# Patient Record
Sex: Female | Born: 1970 | Race: White | Hispanic: No | Marital: Married | Smoking: Never smoker
Health system: Southern US, Community
[De-identification: ages and names within clinical notes are randomized; demographics above are authoritative.]

## PROBLEM LIST (undated history)

## (undated) DIAGNOSIS — R7303 Prediabetes: Secondary | ICD-10-CM

## (undated) DIAGNOSIS — Z9889 Other specified postprocedural states: Secondary | ICD-10-CM

## (undated) DIAGNOSIS — N809 Endometriosis, unspecified: Secondary | ICD-10-CM

## (undated) DIAGNOSIS — Z87820 Personal history of traumatic brain injury: Secondary | ICD-10-CM

## (undated) DIAGNOSIS — M1990 Unspecified osteoarthritis, unspecified site: Secondary | ICD-10-CM

## (undated) DIAGNOSIS — Z8719 Personal history of other diseases of the digestive system: Secondary | ICD-10-CM

## (undated) DIAGNOSIS — E282 Polycystic ovarian syndrome: Secondary | ICD-10-CM

## (undated) DIAGNOSIS — H6990 Unspecified Eustachian tube disorder, unspecified ear: Secondary | ICD-10-CM

## (undated) DIAGNOSIS — Z803 Family history of malignant neoplasm of breast: Secondary | ICD-10-CM

## (undated) DIAGNOSIS — F909 Attention-deficit hyperactivity disorder, unspecified type: Secondary | ICD-10-CM

## (undated) DIAGNOSIS — J302 Other seasonal allergic rhinitis: Secondary | ICD-10-CM

## (undated) DIAGNOSIS — E039 Hypothyroidism, unspecified: Secondary | ICD-10-CM

## (undated) DIAGNOSIS — R519 Headache, unspecified: Secondary | ICD-10-CM

## (undated) DIAGNOSIS — N920 Excessive and frequent menstruation with regular cycle: Secondary | ICD-10-CM

## (undated) DIAGNOSIS — Z87442 Personal history of urinary calculi: Secondary | ICD-10-CM

## (undated) DIAGNOSIS — G4700 Insomnia, unspecified: Secondary | ICD-10-CM

## (undated) DIAGNOSIS — R112 Nausea with vomiting, unspecified: Secondary | ICD-10-CM

## (undated) DIAGNOSIS — N201 Calculus of ureter: Secondary | ICD-10-CM

## (undated) DIAGNOSIS — N946 Dysmenorrhea, unspecified: Secondary | ICD-10-CM

## (undated) DIAGNOSIS — E785 Hyperlipidemia, unspecified: Secondary | ICD-10-CM

## (undated) DIAGNOSIS — K5909 Other constipation: Secondary | ICD-10-CM

## (undated) DIAGNOSIS — K9041 Non-celiac gluten sensitivity: Secondary | ICD-10-CM

## (undated) DIAGNOSIS — H90A31 Mixed conductive and sensorineural hearing loss, unilateral, right ear with restricted hearing on the contralateral side: Secondary | ICD-10-CM

## (undated) DIAGNOSIS — K219 Gastro-esophageal reflux disease without esophagitis: Secondary | ICD-10-CM

## (undated) DIAGNOSIS — G4720 Circadian rhythm sleep disorder, unspecified type: Secondary | ICD-10-CM

## (undated) HISTORY — PX: TONSILLECTOMY: SUR1361

## (undated) HISTORY — PX: CHOLECYSTECTOMY: SHX55

## (undated) HISTORY — PX: BREAST EXCISIONAL BIOPSY: SUR124

## (undated) HISTORY — PX: DIAGNOSTIC LAPAROSCOPY: SUR761

## (undated) HISTORY — DX: Hyperlipidemia, unspecified: E78.5

## (undated) HISTORY — PX: BACK SURGERY: SHX140

## (undated) HISTORY — PX: LAPAROSCOPY: SHX197

---

## 1994-12-04 DIAGNOSIS — Z87820 Personal history of traumatic brain injury: Secondary | ICD-10-CM

## 1994-12-04 HISTORY — DX: Personal history of traumatic brain injury: Z87.820

## 1996-12-04 HISTORY — PX: MANDIBLE FRACTURE SURGERY: SHX706

## 1998-12-04 HISTORY — PX: ANTERIOR CERVICAL DECOMP/DISCECTOMY FUSION: SHX1161

## 2002-12-04 HISTORY — PX: CHOLECYSTECTOMY: SHX55

## 2005-12-04 HISTORY — PX: TYMPANOPLASTY W/ MASTOIDECTOMY: SUR1400

## 2005-12-04 HISTORY — PX: OTHER SURGICAL HISTORY: SHX169

## 2006-12-04 HISTORY — PX: BREAST EXCISIONAL BIOPSY: SUR124

## 2010-12-04 DIAGNOSIS — Z8669 Personal history of other diseases of the nervous system and sense organs: Secondary | ICD-10-CM

## 2010-12-04 HISTORY — DX: Personal history of other diseases of the nervous system and sense organs: Z86.69

## 2011-04-26 ENCOUNTER — Other Ambulatory Visit: Payer: Self-pay | Admitting: Gynecology

## 2011-04-26 DIAGNOSIS — R2230 Localized swelling, mass and lump, unspecified upper limb: Secondary | ICD-10-CM

## 2011-05-05 ENCOUNTER — Ambulatory Visit
Admission: RE | Admit: 2011-05-05 | Discharge: 2011-05-05 | Disposition: A | Discharge status: Home / Self Care | Payer: 59 | Source: Ambulatory Visit | Attending: Gynecology | Admitting: Gynecology

## 2011-05-05 ENCOUNTER — Other Ambulatory Visit: Payer: Self-pay | Admitting: Gynecology

## 2011-05-05 DIAGNOSIS — R2230 Localized swelling, mass and lump, unspecified upper limb: Secondary | ICD-10-CM

## 2011-06-24 ENCOUNTER — Observation Stay (HOSPITAL_COMMUNITY)
Admission: EM | Admit: 2011-06-24 | Discharge: 2011-06-24 | Disposition: A | Discharge status: Home / Self Care | Payer: 59 | Attending: Emergency Medicine | Admitting: Emergency Medicine

## 2011-06-24 ENCOUNTER — Emergency Department (HOSPITAL_COMMUNITY): Payer: 59

## 2011-06-24 DIAGNOSIS — Z79899 Other long term (current) drug therapy: Secondary | ICD-10-CM | POA: Insufficient documentation

## 2011-06-24 DIAGNOSIS — N12 Tubulo-interstitial nephritis, not specified as acute or chronic: Principal | ICD-10-CM | POA: Insufficient documentation

## 2011-06-24 DIAGNOSIS — R112 Nausea with vomiting, unspecified: Secondary | ICD-10-CM | POA: Insufficient documentation

## 2011-06-24 DIAGNOSIS — E282 Polycystic ovarian syndrome: Secondary | ICD-10-CM | POA: Insufficient documentation

## 2011-06-24 LAB — URINE MICROSCOPIC-ADD ON

## 2011-06-24 LAB — URINALYSIS, ROUTINE W REFLEX MICROSCOPIC
Nitrite: NEGATIVE
Specific Gravity, Urine: 1.02 (ref 1.005–1.030)
Urobilinogen, UA: 0.2 mg/dL (ref 0.0–1.0)

## 2011-06-26 LAB — POCT I-STAT, CHEM 8
Calcium, Ion: 1.15 mmol/L (ref 1.12–1.32)
Chloride: 109 mEq/L (ref 96–112)
HCT: 44 % (ref 36.0–46.0)
Sodium: 140 mEq/L (ref 135–145)
TCO2: 22 mmol/L (ref 0–100)

## 2011-06-26 LAB — POCT PREGNANCY, URINE: Preg Test, Ur: NEGATIVE

## 2011-06-26 LAB — LIPASE, BLOOD: Lipase: 251 U/L — ABNORMAL HIGH (ref 11–59)

## 2011-06-26 LAB — CBC
HCT: 41.7 % (ref 36.0–46.0)
Platelets: 305 10*3/uL (ref 150–400)
RDW: 13.1 % (ref 11.5–15.5)
WBC: 12.2 10*3/uL — ABNORMAL HIGH (ref 4.0–10.5)

## 2011-06-26 LAB — HEPATIC FUNCTION PANEL
Bilirubin, Direct: <0.1 mg/dL (ref 0.0–0.3)
Total Bilirubin: 0.2 mg/dL — ABNORMAL LOW (ref 0.3–1.2)

## 2011-06-28 ENCOUNTER — Other Ambulatory Visit: Payer: Self-pay | Admitting: Gastroenterology

## 2011-06-28 DIAGNOSIS — R1013 Epigastric pain: Secondary | ICD-10-CM

## 2011-06-29 ENCOUNTER — Ambulatory Visit
Admission: RE | Admit: 2011-06-29 | Discharge: 2011-06-29 | Disposition: A | Discharge status: Home / Self Care | Payer: 59 | Source: Ambulatory Visit | Attending: Gastroenterology | Admitting: Gastroenterology

## 2011-06-29 DIAGNOSIS — R1013 Epigastric pain: Secondary | ICD-10-CM

## 2011-06-29 MED ORDER — GADOBENATE DIMEGLUMINE 529 MG/ML IV SOLN
15.0000 mL | Freq: Once | INTRAVENOUS | Status: AC | PRN
  Administered 2011-06-29: 15 mL via INTRAVENOUS

## 2011-10-05 HISTORY — PX: ENDOSCOPIC TURBINATE REDUCTION: SHX6489

## 2011-10-06 ENCOUNTER — Other Ambulatory Visit: Payer: Self-pay | Admitting: Family Medicine

## 2011-10-06 DIAGNOSIS — M542 Cervicalgia: Secondary | ICD-10-CM

## 2011-10-10 ENCOUNTER — Other Ambulatory Visit: Payer: 59

## 2011-10-10 ENCOUNTER — Ambulatory Visit
Admission: RE | Admit: 2011-10-10 | Discharge: 2011-10-10 | Disposition: A | Discharge status: Home / Self Care | Payer: 59 | Source: Ambulatory Visit | Attending: Family Medicine | Admitting: Family Medicine

## 2011-10-10 DIAGNOSIS — M542 Cervicalgia: Secondary | ICD-10-CM

## 2011-11-21 ENCOUNTER — Other Ambulatory Visit: Payer: Self-pay | Admitting: Neurology

## 2011-11-21 DIAGNOSIS — H902 Conductive hearing loss, unspecified: Secondary | ICD-10-CM

## 2011-11-21 DIAGNOSIS — F0781 Postconcussional syndrome: Secondary | ICD-10-CM

## 2011-11-21 DIAGNOSIS — R209 Unspecified disturbances of skin sensation: Secondary | ICD-10-CM

## 2011-11-30 ENCOUNTER — Ambulatory Visit
Admission: RE | Admit: 2011-11-30 | Discharge: 2011-11-30 | Disposition: A | Discharge status: Home / Self Care | Payer: 59 | Source: Ambulatory Visit | Attending: Neurology | Admitting: Neurology

## 2011-11-30 DIAGNOSIS — R209 Unspecified disturbances of skin sensation: Secondary | ICD-10-CM

## 2011-11-30 DIAGNOSIS — H902 Conductive hearing loss, unspecified: Secondary | ICD-10-CM

## 2011-11-30 DIAGNOSIS — F0781 Postconcussional syndrome: Secondary | ICD-10-CM

## 2011-11-30 MED ORDER — GADOBENATE DIMEGLUMINE 529 MG/ML IV SOLN
16.0000 mL | Freq: Once | INTRAVENOUS | Status: AC | PRN
  Administered 2011-11-30: 16 mL via INTRAVENOUS

## 2012-04-25 ENCOUNTER — Other Ambulatory Visit: Payer: Self-pay | Admitting: Family Medicine

## 2012-04-25 DIAGNOSIS — R1084 Generalized abdominal pain: Secondary | ICD-10-CM

## 2012-05-02 ENCOUNTER — Ambulatory Visit
Admission: RE | Admit: 2012-05-02 | Discharge: 2012-05-02 | Disposition: A | Discharge status: Home / Self Care | Payer: 59 | Source: Ambulatory Visit | Attending: Family Medicine | Admitting: Family Medicine

## 2012-05-02 DIAGNOSIS — R1084 Generalized abdominal pain: Secondary | ICD-10-CM

## 2012-05-02 MED ORDER — IOHEXOL 300 MG/ML  SOLN
100.0000 mL | Freq: Once | INTRAMUSCULAR | Status: AC | PRN
  Administered 2012-05-02: 100 mL via INTRAVENOUS

## 2012-05-15 ENCOUNTER — Encounter (INDEPENDENT_AMBULATORY_CARE_PROVIDER_SITE_OTHER): Payer: Self-pay | Admitting: Surgery

## 2012-05-16 ENCOUNTER — Ambulatory Visit (INDEPENDENT_AMBULATORY_CARE_PROVIDER_SITE_OTHER): Payer: 59 | Admitting: Surgery

## 2012-05-16 ENCOUNTER — Encounter (INDEPENDENT_AMBULATORY_CARE_PROVIDER_SITE_OTHER): Payer: Self-pay | Admitting: Surgery

## 2012-05-16 VITALS — BP 120/82 | HR 68 | Temp 97.4°F | Resp 14 | Ht 64.0 in | Wt 174.6 lb

## 2012-05-16 DIAGNOSIS — K432 Incisional hernia without obstruction or gangrene: Secondary | ICD-10-CM | POA: Insufficient documentation

## 2012-05-16 DIAGNOSIS — R109 Unspecified abdominal pain: Secondary | ICD-10-CM | POA: Insufficient documentation

## 2012-05-16 NOTE — Patient Instructions (Signed)
[facility]  UMBILICAL OR INGUINAL HERNIA REPAIR POST OP INSTRUCTIONS  Always review your discharge instruction sheet given to you by the facility where your surgery was performed.  1. A  prescription for pain medication may be given to you upon discharge.  Take your pain medication as prescribed, if needed.  If narcotic pain medicine is not needed, then you may take acetaminophen (Tylenol) or ibuprofen (Advil) as needed. 2. Take your usually prescribed medications unless otherwise directed. 3. If you need a refill on your pain medication, please contact your pharmacy.  They will contact our office to request authorization. Prescriptions will not be filled after 5 pm daily or on weekends. 4. You should follow a light diet the first 24 hours after arrival home, such as soup and crackers, etc.  Be sure to include lots of fluids daily.  Resume your normal diet the day after surgery. 5. Most patients will experience some swelling and bruising around the umbilicus or in the groin and scrotum.  Ice packs and reclining will help.  Swelling and bruising can take several days to resolve.  6. It is common to experience some constipation if taking pain medication after surgery.  Increasing fluid intake and taking a stool softener (such as Colace) will usually help or prevent this problem from occurring.  A mild laxative (Milk of Magnesia or Miralax) should be taken according to package directions if there are no bowel movements after 48 hours. 7. Unless discharge instructions indicate otherwise, you may remove your bandages 24-48 hours after surgery, and you may shower at that time.  You may have steri-strips (small skin tapes) in place directly over the incision.  These strips should be left on the skin for 7-10 days.  If your surgeon used skin glue on the incision, you may shower in 24 hours.  The glue will flake off over the next 2-3 weeks.  Any sutures or staples will be removed at the office  during your follow-up visit. 8. ACTIVITIES:  You may resume regular (light) daily activities beginning the next day--such as daily self-care, walking, climbing stairs--gradually increasing activities as tolerated.  You may have sexual intercourse when it is comfortable.  Refrain from any heavy lifting or straining until approved by your doctor.  You may drive when you are no longer taking prescription pain medication, you can comfortably wear a seatbelt, and you can safely maneuver your car and apply brakes. 9. You should see your doctor in the office for a follow-up appointment approximately 2-3 weeks after your surgery.  Make sure that you call for this appointment within a day or two after you arrive home to insure a convenient appointment time.  WHEN TO CALL YOUR DOCTOR: 1. Fever over 101.0 2. Inability to urinate 3. Nausea and/or vomiting 4. Extreme swelling or bruising 5. Continued bleeding from incision. 6. Increased pain, redness, or drainage from the incision The clinic staff is available to answer your questions during regular business hours.  Please don't hesitate to call and ask to speak to one of the nurses for clinical concerns.  If you have a medical emergency, go to the nearest emergency room or call 911.  A surgeon from [facility] is always on call at the hospital.   [address], [address], [place], [place]  [address]  [address] [address], , [place]   [address] [phone] ? [phone] ? FAX [phone] Website: [email or web address]   

## 2012-05-16 NOTE — Progress Notes (Signed)
Chief Complaint  Patient presents with  . Abdominal Pain    new referral from Dr. Teodora Medici - RLQ and periumbilical abdominal pain    HISTORY: Patient is a 41 year old white female referred by her gynecologist for peri-umbilical and right lower quadrant abdominal pain. Patient has had a significant past surgical history with cholecystectomy performed in [place], [place], in 2004. She has a history of endometriosis and has undergone laparoscopy on two prior occasions, the most recent being in 2011 in [place], [place]. Patient has polycystic ovaries.  Patient presents with complaints of lower abdominal pain. This is exacerbated by physical activity and by sexual intercourse. Patient has noted a small hernia at the umbilicus. This was confirmed on CT scan of the abdomen and pelvis performed on May 02, 2012. No other significant abnormality was seen on that scan.  Patient is referred at this time for consideration for hernia repair and possible diagnostic laparoscopy concurrently for evaluation of lower abdominal pain.  Past Medical History  Diagnosis Date  . Thyroid disease     hypothyroidism  . Hyperlipidemia      Current Outpatient Prescriptions  Medication Sig Dispense Refill  . fish oil-omega-3 fatty acids 1000 MG capsule Take 2 g by mouth daily.      Marland Kitchen levothyroxine (LEVOXYL) 137 MCG tablet Take 137 mcg by mouth daily.      . Multiple Vitamins-Minerals (MULTIVITAMIN WITH MINERALS) tablet Take 1 tablet by mouth daily.      . traZODone (DESYREL) 100 MG tablet Take 100 mg by mouth at bedtime.         Allergies  Allergen Reactions  . Ciprofloxacin   . Codeine   . Latex   . Morphine And Related      Family History  Problem Relation Age of Onset  . Cancer Father     melanoma  . Heart disease Maternal Aunt   . Heart disease Maternal Uncle   . Heart disease Maternal Grandmother   . Heart disease Maternal Grandfather      History   Social History  .  Marital Status: Married    Spouse Name: N/A    Number of Children: N/A  . Years of Education: N/A   Social History Main Topics  . Smoking status: Never Smoker   . Smokeless tobacco: Never Used  . Alcohol Use: No  . Drug Use: No  . Sexually Active:    Other Topics Concern  . None   Social History Narrative  . None    REVIEW OF SYSTEMS - PERTINENT POSITIVES ONLY: Intermittent abdominal pain, exacerbated by physical activity; mild weight gain; no signs or symptoms of obstruction  EXAM: Filed Vitals:   05/16/12 1035  BP: 120/82  Pulse: 68  Temp: 97.4 F (36.3 C)  Resp: 14    HEENT: normocephalic; pupils equal and reactive; sclerae clear; dentition good; mucous membranes moist NECK:  symmetric on extension; no palpable anterior or posterior cervical lymphadenopathy; no supraclavicular masses; no tenderness CHEST: clear to auscultation bilaterally without rales, rhonchi, or wheezes CARDIAC: regular rate and rhythm without significant murmur; peripheral pulses are full ABDOMEN: Well-healed incision at the umbilicus; obvious fascial defect approximately 1 cm in diameter, hernia augments with Valsalva and cough; small rectus diastases augmented with sit up maneuver; soft without distension; bowel sounds present; no mass; no hepatosplenomegaly EXT:  non-tender without edema; no deformity NEURO: no gross focal deficits; no sign of tremor   LABORATORY RESULTS: See [facility] (CHL-Epic) for most  recent results   RADIOLOGY RESULTS: See [facility] (CHL-Epic) for most recent results   IMPRESSION: #1 incisional hernia at umbilicus, reducible, symptomatic #2 lower abdominal and pelvic pain of undetermined etiology #3 history of endometriosis  PLAN: I discussed the above findings and studies at length with the patient and her husband. I have recommended repair of her incisional hernia at the umbilicus with mesh. We have discussed the risk and benefits of using mesh. We  have discussed the risk of recurrence.  Patient is also interested in diagnostic laparoscopy at the time of repair. I think this is appropriate given her history. We will coordinate this for her in the near future.  The risks and benefits of the procedure have been discussed at length with the patient.  The patient understands the proposed procedure, potential alternative treatments, and the course of recovery to be expected.  All of the patient's questions have been answered at this time.  The patient wishes to proceed with surgery.  Velora Heckler, MD, FACS General & Endocrine Surgery [facility]   Visit Diagnoses: No diagnosis found.  Primary Care Physician: Cain Saupe, MD  GYN:  Dr. Teodora Medici

## 2012-06-13 ENCOUNTER — Telehealth (INDEPENDENT_AMBULATORY_CARE_PROVIDER_SITE_OTHER): Payer: Self-pay | Admitting: General Surgery

## 2012-06-13 DIAGNOSIS — K429 Umbilical hernia without obstruction or gangrene: Secondary | ICD-10-CM

## 2012-06-13 HISTORY — PX: OTHER SURGICAL HISTORY: SHX169

## 2012-06-13 NOTE — Telephone Encounter (Signed)
Pt's husband calling for stronger pian meds.  Discussed her allergies to narcotics, but she is only intolerant of codeine, with nausea side effects.  She will try taking the Hydrocodone 5/325 mg, # 30, 1 po Q4-6H prn pain, no refill with OK by Dr. Gerrit Friends.  Called med to [facility]:  [phone].

## 2012-06-14 ENCOUNTER — Telehealth (INDEPENDENT_AMBULATORY_CARE_PROVIDER_SITE_OTHER): Payer: Self-pay | Admitting: General Surgery

## 2012-06-14 NOTE — Telephone Encounter (Signed)
Called patient per Dr. Ezzard Standing request. Patient stated that she is taking the Ultram but it is only taking a little edge off the pain, but not resolving it. Patient stated that Demerol is the only thing that works for her, but would like to know if Dr. Gerrit Friends can issue that for her. If so, she understands her husband would have to come to our office to pick up the prescription. Patient stated that she is allergic to hydrocodone also. While I had the patient on the phone I updated her allergies and updated record with the reactions. I advised the patient that Dr. Gerrit Friends is in surgeries this morning, but we would get back to her with an answer as quickly as possible. Patient agreed.

## 2012-06-14 NOTE — Telephone Encounter (Signed)
Patient called and advised that Dr. Gerrit Friends was made aware of her request and approved her for demerol. A copy of the prescription issued sent to medical records to be scanned. Dr. Johna Sheriff signed on his behalf due to being in the hospital for surgeries.  Patient issued demerol 50 mg 20 tablets 1 - 2 every 4 hours prn, no refills.  Patient advised her husband will pick up the prescription. Advised he needs to bring identification to pick up. Patient agreed.

## 2012-06-26 ENCOUNTER — Encounter (INDEPENDENT_AMBULATORY_CARE_PROVIDER_SITE_OTHER): Payer: Self-pay | Admitting: Surgery

## 2012-06-26 ENCOUNTER — Ambulatory Visit (INDEPENDENT_AMBULATORY_CARE_PROVIDER_SITE_OTHER): Payer: 59 | Admitting: Surgery

## 2012-06-26 VITALS — BP 110/78 | HR 68 | Temp 97.4°F | Resp 18 | Ht 65.0 in | Wt 173.0 lb

## 2012-06-26 DIAGNOSIS — R109 Unspecified abdominal pain: Secondary | ICD-10-CM

## 2012-06-26 DIAGNOSIS — K432 Incisional hernia without obstruction or gangrene: Secondary | ICD-10-CM

## 2012-06-26 NOTE — Progress Notes (Signed)
General Surgery [facility]  Visit Diagnoses: 1. Incisional hernia, umbilicus   2. Abdominal pain     HISTORY: Patient returns for her first postoperative visit having undergone diagnostic laparoscopy and repair of umbilical hernia with mesh. Diagnostic laparoscopy failed to identify any intra-abdominal abnormality. There was no sign of endometriosis. There is no sign of inflammatory bowel disease. There were no significant adhesions.  EXAM: Abdomen is soft nontender without distention. Surgical wounds are healing nicely without signs of inflammation or infection. There is no sign of recurrent hernia. Dermabond remains in place on the incisions.  IMPRESSION: Status post diagnostic laparoscopy and repair of umbilical hernia with mesh  PLAN: Patient will apply topical creams to her incisions once the Dermabond is removed. She will restrict her lifting to 20 pounds. She will return to see me for a final wound check in 6 weeks.  Velora Heckler, MD, FACS General & Endocrine Surgery [facility]

## 2012-06-26 NOTE — Patient Instructions (Signed)
  COCOA BUTTER & VITAMIN E CREAM  (Palmer's or other brand)  Apply cocoa butter/vitamin E cream to your incision 2 - 3 times daily.  Massage cream into incision for one minute with each application.  Use sunscreen (50 SPF or higher) for first 6 months after surgery if area is exposed to sun.  You may substitute Mederma or other scar reducing creams as desired.   

## 2012-08-08 ENCOUNTER — Encounter (INDEPENDENT_AMBULATORY_CARE_PROVIDER_SITE_OTHER): Payer: Self-pay | Admitting: Surgery

## 2012-08-08 ENCOUNTER — Ambulatory Visit (INDEPENDENT_AMBULATORY_CARE_PROVIDER_SITE_OTHER): Payer: 59 | Admitting: Surgery

## 2012-08-08 VITALS — BP 108/72 | HR 72 | Temp 97.9°F | Resp 16 | Ht 65.0 in | Wt 172.0 lb

## 2012-08-08 DIAGNOSIS — K432 Incisional hernia without obstruction or gangrene: Secondary | ICD-10-CM

## 2012-08-08 NOTE — Progress Notes (Signed)
General Surgery [facility]  Visit Diagnoses: 1. Incisional hernia, umbilicus     HISTORY: The patient is a 42 year old white female who underwent diagnostic laparoscopy and repair of incisional hernia at the umbilicus with mesh. She returns for final postoperative visit.  EXAM: Surgical wound at the umbilicus is well-healed. Patient is examined both in the recumbent in a standing position. There is no sign of recurrent hernia. She does have a mild rectus diastases.  IMPRESSION: Status post umbilical hernia repair with mesh  PLAN: Patient is released to full activity without restriction. She will apply topical creams to her incision. She will return to see [place] in this office as needed.  Velora Heckler, MD, FACS General & Endocrine Surgery [facility]

## 2012-08-08 NOTE — Patient Instructions (Signed)
  COCOA BUTTER & VITAMIN E CREAM  (Palmer's or other brand)  Apply cocoa butter/vitamin E cream to your incision 2 - 3 times daily.  Massage cream into incision for one minute with each application.  Use sunscreen (50 SPF or higher) for first 6 months after surgery if area is exposed to sun.  You may substitute Mederma or other scar reducing creams as desired.   

## 2012-09-16 ENCOUNTER — Other Ambulatory Visit: Payer: Self-pay | Admitting: Gastroenterology

## 2012-09-16 DIAGNOSIS — Q453 Other congenital malformations of pancreas and pancreatic duct: Secondary | ICD-10-CM

## 2012-09-23 ENCOUNTER — Ambulatory Visit
Admission: RE | Admit: 2012-09-23 | Discharge: 2012-09-23 | Disposition: A | Discharge status: Home / Self Care | Payer: 59 | Source: Ambulatory Visit | Attending: Gastroenterology | Admitting: Gastroenterology

## 2012-09-23 DIAGNOSIS — Q453 Other congenital malformations of pancreas and pancreatic duct: Secondary | ICD-10-CM

## 2012-09-23 MED ORDER — GADOBENATE DIMEGLUMINE 529 MG/ML IV SOLN
15.0000 mL | Freq: Once | INTRAVENOUS | Status: AC | PRN
  Administered 2012-09-23: 15 mL via INTRAVENOUS

## 2012-11-04 ENCOUNTER — Ambulatory Visit (INDEPENDENT_AMBULATORY_CARE_PROVIDER_SITE_OTHER): Payer: 59 | Admitting: Surgery

## 2012-11-04 VITALS — BP 118/72 | HR 68 | Temp 97.4°F | Resp 18 | Ht 65.0 in | Wt 171.0 lb

## 2012-11-04 DIAGNOSIS — R2230 Localized swelling, mass and lump, unspecified upper limb: Secondary | ICD-10-CM

## 2012-11-04 DIAGNOSIS — R229 Localized swelling, mass and lump, unspecified: Secondary | ICD-10-CM

## 2012-11-04 NOTE — Progress Notes (Signed)
General Surgery [facility]  Visit Diagnoses: 1. Axillary mass, left     HISTORY: Patient is a 41 year old white female known to my surgical practice from previous diagnostic laparoscopy. She has had a long-standing problem with left axillary ectopic breast tissue. This causes discomfort. He waxes and wanes with her monthly menstrual cycle. It causes pain with physical exercise. She has had diagnostic studies including ultrasound and mammography. She presents today for consideration for surgical excision for symptomatic relief.  PERTINENT REVIEW OF SYSTEMS: Intermittent pain left axilla, tissue waxes and wanes in size with menstrual cycle.  EXAM: HEENT: normocephalic; pupils equal and reactive; sclerae clear; dentition good; mucous membranes moist NECK:  symmetric on extension; no palpable anterior or posterior cervical lymphadenopathy; no supraclavicular masses; no tenderness CHEST: clear to auscultation bilaterally without rales, rhonchi, or wheezes CARDIAC: regular rate and rhythm without significant murmur; peripheral pulses are full EXT:  non-tender without edema; no deformity; left axilla with prominent tissue in the anterior inferior axilla behind the pectoralis major muscle consistent with ectopic breast tissue; no lymphadenopathy NEURO: no gross focal deficits; no sign of tremor   IMPRESSION: Ectopic breast tissue, left axilla, symptomatic  PLAN: The patient and I discussed the options for surgical excision. The patient's mother was present for the discussion. We discussed outpatient surgery with excision of ectopic tissue from the low axilla. We discussed potential complications been areas of anesthesia on the scan and possible postoperative seroma formation. They understand and wish to proceed. We will make arrangements for outpatient surgery at the surgical center.  Velora Heckler, MD, [facility] Office: [phone]

## 2012-11-04 NOTE — Patient Instructions (Signed)
  [facility] -- DISCHARGE INSTRUCTIONS  REMINDER:   Carry a list of your medications and allergies with you at all times  Call your pharmacy at least 1 week in advance to refill prescriptions  Do not mix any prescribed pain medicine with alcohol  Do not drive any motor vehicles while taking pain medication  Take medications with food unless otherwise directed  Follow-up appointments (date to return to physician): Please call [phone] to confirm your follow up appointment with your surgeon.  Call your Surgeon if you have:  Temperature greater than 101.0  Persistent nausea and vomiting  Severe uncontrolled pain  Redness, tenderness, or signs of infection (pain, swelling, redness, odor or    green/yellow discharge around the site)  Difficulty breathing, headache or visual disturbances  Hives  Persistent dizziness or light-headedness  Any other questions or concerns you may have after discharge  In an emergency, call 911 or go to an Emergency Department at a nearby hospital.  Diet: Begin with liquids, and if they are tolerated, resume your usual diet.  Avoid spicy, greasy or heavy foods.  If you have nausea or vomiting, go back to liquids.  If you cannot keep liquids down, call your doctor.  Avoid alcohol consumption while on prescription pain medications. Good nutrition promotes healing. Increase fiber and fluids.   Reshaun Briseno M. Lean Jaeger, MD, FACS [facility] Office: [phone] 

## 2012-11-15 ENCOUNTER — Other Ambulatory Visit (INDEPENDENT_AMBULATORY_CARE_PROVIDER_SITE_OTHER): Payer: Self-pay | Admitting: Surgery

## 2012-11-15 DIAGNOSIS — Q838 Other congenital malformations of breast: Secondary | ICD-10-CM

## 2012-11-15 HISTORY — PX: OTHER SURGICAL HISTORY: SHX169

## 2012-11-18 ENCOUNTER — Telehealth (INDEPENDENT_AMBULATORY_CARE_PROVIDER_SITE_OTHER): Payer: Self-pay

## 2012-11-18 ENCOUNTER — Telehealth (INDEPENDENT_AMBULATORY_CARE_PROVIDER_SITE_OTHER): Payer: Self-pay | Admitting: General Surgery

## 2012-11-18 NOTE — Telephone Encounter (Signed)
LMOM for pt to call back. Per Dr Gerrit Friends pt to be given path result as benign when she calls back.

## 2012-11-18 NOTE — Progress Notes (Signed)
Quick Note:  Please contact patient and notify of benign pathology results.  Aymara Sassi M. Jervon Ream, MD, FACS [facility] Office: [phone]   ______ 

## 2012-11-18 NOTE — Telephone Encounter (Signed)
Patient called back and I told her path report

## 2012-11-22 ENCOUNTER — Ambulatory Visit (INDEPENDENT_AMBULATORY_CARE_PROVIDER_SITE_OTHER): Payer: 59 | Admitting: General Surgery

## 2012-11-22 ENCOUNTER — Encounter (INDEPENDENT_AMBULATORY_CARE_PROVIDER_SITE_OTHER): Payer: Self-pay | Admitting: General Surgery

## 2012-11-22 ENCOUNTER — Encounter (INDEPENDENT_AMBULATORY_CARE_PROVIDER_SITE_OTHER): Payer: 59 | Admitting: General Surgery

## 2012-11-22 VITALS — BP 118/78 | HR 72 | Temp 97.8°F | Resp 16 | Ht 64.0 in | Wt 172.6 lb

## 2012-11-22 DIAGNOSIS — IMO0002 Reserved for concepts with insufficient information to code with codable children: Secondary | ICD-10-CM | POA: Insufficient documentation

## 2012-11-22 NOTE — Patient Instructions (Signed)
Try wearing a compression shirt or ace wrap

## 2012-11-22 NOTE — Progress Notes (Signed)
Subjective:     Patient ID: Patty Ruiz, female   DOB: 11/24/1971, 41 y.o.   MRN: [id number]  HPI 41 year old Caucasian female comes in complaining of swelling underneath her left axillary incision. She recently underwent excision of left axillary ectopic breast tissue on December 13. She states that the swelling started a few days ago but last night and this morning it became very intense and uncomfortable. She denies any fevers or chills. She denies any incisional redness or drainage.  Review of Systems     Objective:   Physical Exam BP 118/78  Pulse 72  Temp 97.8 F (36.6 C) (Temporal)  Resp 16  Ht 5\' 4"  (1.626 m)  Wt 172 lb 9.6 oz (78.291 kg)  BMI 29.63 kg/m2 Alert, no apparent distress Left axilla-incision is clean, dry, and intact. No cellulitis, or induration. There is a seroma underneath and above the incision.    Assessment:     Postoperative seroma    Plan:     We discussed the nature of seroma as. We discussed several options including observation versus aspiration. We discussed the pros and cons of each. The patient elected to have the area aspirated. After obtaining informed consent, the area was prepped with ChloraPrep. Then using a 60 cc syringe  with an 18-gauge needleI was able to aspirate approximately 50 cc of straw-colored fluid. She tolerated the procedure well. A dressing was applied. She is encouraged to wear either an Ace wrap or a compression garment to help prevent recurrence. She was given a copy of her pathology report. She will followup with Dr. Gerrit Friends as previously scheduled  Patty Ruiz. Andrey Campanile, MD, FACS General, Bariatric, & Minimally Invasive Surgery [facility] [place]

## 2012-11-26 ENCOUNTER — Telehealth (INDEPENDENT_AMBULATORY_CARE_PROVIDER_SITE_OTHER): Payer: Self-pay

## 2012-11-26 NOTE — Telephone Encounter (Signed)
Patient calling into office requesting appointment for today.  Patient reports an increase in swelling underneath her left axilla.  Patient had 50 cc's aspirated on Friday 11/22/12 w/Dr. Andrey Campanile.  Per Dr. Andrey Campanile patient advised to wear her compression garment and currently wearing a sports bra.  Patient reports she's unable to apply an ace wrap around her due to discomfort.  Patient denies any fever or chills. Patient given appointment for Thursday November 28, 2012 w/Dr. Biagio Quint.  Patient advised to report to emergency room if symptoms worsen or persist.

## 2012-11-28 ENCOUNTER — Encounter (INDEPENDENT_AMBULATORY_CARE_PROVIDER_SITE_OTHER): Payer: Self-pay | Admitting: General Surgery

## 2012-11-28 ENCOUNTER — Ambulatory Visit (INDEPENDENT_AMBULATORY_CARE_PROVIDER_SITE_OTHER): Payer: 59 | Admitting: General Surgery

## 2012-11-28 VITALS — BP 122/76 | HR 69 | Temp 98.8°F | Resp 18 | Ht 64.5 in | Wt 171.8 lb

## 2012-11-28 DIAGNOSIS — IMO0002 Reserved for concepts with insufficient information to code with codable children: Secondary | ICD-10-CM

## 2012-11-28 MED ORDER — MEPERIDINE HCL 50 MG PO TABS: 25.0000 mg | ORAL_TABLET | Freq: Three times a day (TID) | ORAL | Status: AC | PRN

## 2012-11-28 NOTE — Progress Notes (Signed)
Subjective:     Patient ID: Patty Ruiz, female   DOB: December 10, 1970, 41 y.o.   MRN: [id number]  HPI This patient follows up 2 weeks status post excision of left axillary mass by Dr. Gerrit Friends.  She has since developed some swelling in her left axilla. She was seen last week by Dr. Andrey Campanile and he aspirated some clear fluid from the area consistent with a seroma. She was told to follow up today for repeat aspiration. She says that she still has some discomfort in the area of both is not any worse than before. She has no redness or fevers or chills or drainage.  Review of Systems     Objective:   Physical Exam Her incision looks good without sign of infection. She has some ballotable fullness in the left axilla consistent with a postoperative seroma of but there is no erythema and only minimal tenderness.    Assessment:     Postoperative seroma She has a postoperative seroma without any evidence of infection. I discussed with her the options for aspiration versus continued observation and I have recommended continued observation since aspiration we'll not make her seroma go away definitively and only risk infection. Her symptoms are not that severe that I think warrant aspiration today.    Plan:     Continued observation and I did give her some oral Demerol for times of increased discomfort. She will follow up with her surgeon at her regularly scheduled appointment for followup with [place] sooner if she has any signs of infection.

## 2012-11-29 ENCOUNTER — Telehealth (INDEPENDENT_AMBULATORY_CARE_PROVIDER_SITE_OTHER): Payer: Self-pay | Admitting: General Surgery

## 2012-11-29 ENCOUNTER — Other Ambulatory Visit (INDEPENDENT_AMBULATORY_CARE_PROVIDER_SITE_OTHER): Payer: Self-pay

## 2012-11-29 MED ORDER — TRAMADOL HCL 50 MG PO TABS: 50.0000 mg | ORAL_TABLET | Freq: Four times a day (QID) | ORAL | Status: AC | PRN

## 2012-11-29 NOTE — Telephone Encounter (Signed)
Tramadol 50 mg #40 with no refills sent electronically to [facility]. Patient made aware. She is shredding RX for demerol.

## 2012-11-29 NOTE — Telephone Encounter (Signed)
Patient called stating she was given a hand written RX yesterday or demerol 25 mg #20 - but pharmacy will not fill because it doesn't come in 25 mg tablets. Patient states she used her tramadol last night and today and it seems to be taking care of the pain okay. She is calling to see if we can just refill her tramadol to [facility] on [address]. Please advise.

## 2012-12-18 ENCOUNTER — Encounter (INDEPENDENT_AMBULATORY_CARE_PROVIDER_SITE_OTHER): Payer: Self-pay | Admitting: Surgery

## 2012-12-18 ENCOUNTER — Ambulatory Visit (INDEPENDENT_AMBULATORY_CARE_PROVIDER_SITE_OTHER): Payer: 59 | Admitting: Surgery

## 2012-12-18 VITALS — BP 118/76 | HR 72 | Temp 97.8°F | Resp 18 | Wt 169.0 lb

## 2012-12-18 DIAGNOSIS — R229 Localized swelling, mass and lump, unspecified: Secondary | ICD-10-CM

## 2012-12-18 DIAGNOSIS — R2230 Localized swelling, mass and lump, unspecified upper limb: Secondary | ICD-10-CM

## 2012-12-18 NOTE — Progress Notes (Signed)
General Surgery [facility]  Visit Diagnoses: 1. Axillary mass, left     HISTORY: Patient returns for postoperative visit having undergone excision of left axillary tissue. Final pathology shows benign adipose tissue and benign breast tissue consistent with ectopic breast tissue. No sign of malignancy was identified. Patient required aspiration of seroma here in the office a few weeks ago. This is now resolved.  EXAM: Surgical wound is well-healed. Minimal soft tissue swelling. No sign of seroma. No sign of infection.  IMPRESSION: Status post excision of ectopic breast tissue left axilla  PLAN: Patient will apply topical creams to her incisions. She will return for follow-up as needed.  Velora Heckler, MD, FACS General & Endocrine Surgery [facility]

## 2012-12-18 NOTE — Patient Instructions (Signed)
  COCOA BUTTER & VITAMIN E CREAM  (Palmer's or other brand)  Apply cocoa butter/vitamin E cream to your incision 2 - 3 times daily.  Massage cream into incision for one minute with each application.  Use sunscreen (50 SPF or higher) for first 6 months after surgery if area is exposed to sun.  You may substitute Mederma or other scar reducing creams as desired.   

## 2013-12-09 ENCOUNTER — Other Ambulatory Visit: Payer: Self-pay | Admitting: Gynecology

## 2013-12-19 ENCOUNTER — Other Ambulatory Visit: Payer: Self-pay | Admitting: Endocrinology

## 2013-12-19 DIAGNOSIS — E041 Nontoxic single thyroid nodule: Secondary | ICD-10-CM

## 2013-12-23 ENCOUNTER — Ambulatory Visit
Admission: RE | Admit: 2013-12-23 | Discharge: 2013-12-23 | Disposition: A | Discharge status: Home / Self Care | Payer: 59 | Source: Ambulatory Visit | Attending: Endocrinology | Admitting: Endocrinology

## 2013-12-23 DIAGNOSIS — E041 Nontoxic single thyroid nodule: Secondary | ICD-10-CM

## 2014-11-25 ENCOUNTER — Other Ambulatory Visit: Payer: Self-pay

## 2014-11-26 LAB — CYTOLOGY - PAP

## 2014-11-30 ENCOUNTER — Other Ambulatory Visit: Payer: Self-pay | Admitting: Gynecology

## 2014-11-30 DIAGNOSIS — R2232 Localized swelling, mass and lump, left upper limb: Secondary | ICD-10-CM

## 2014-12-02 ENCOUNTER — Telehealth: Payer: Self-pay | Admitting: Genetic Counselor

## 2014-12-02 NOTE — Telephone Encounter (Signed)
S/W PATIENT AND GAVE GENETIC APPT FOR 01/14 @ 9 W/GENETIC COUNSELOR

## 2014-12-09 ENCOUNTER — Ambulatory Visit
Admission: RE | Admit: 2014-12-09 | Discharge: 2014-12-09 | Disposition: A | Discharge status: Home / Self Care | Payer: 59 | Source: Ambulatory Visit | Attending: Gynecology | Admitting: Gynecology

## 2014-12-09 DIAGNOSIS — R2232 Localized swelling, mass and lump, left upper limb: Secondary | ICD-10-CM

## 2014-12-17 ENCOUNTER — Other Ambulatory Visit: Payer: 59

## 2014-12-17 ENCOUNTER — Ambulatory Visit: Payer: 59

## 2014-12-17 DIAGNOSIS — Z803 Family history of malignant neoplasm of breast: Secondary | ICD-10-CM

## 2014-12-21 DIAGNOSIS — Z803 Family history of malignant neoplasm of breast: Secondary | ICD-10-CM | POA: Insufficient documentation

## 2014-12-21 NOTE — Progress Notes (Unsigned)
[facility] Patient Visit  REFERRING PROVIDER: Antony Blackbird, MD [place], [place] [address]  PRIMARY PROVIDER:  FULP, CAMMIE, MD  PRIMARY REASON FOR VISIT:  Family history of breast cancer  HISTORY OF PRESENT ILLNESS:   Patty Ruiz, a 44 y.o. female, was seen for a [facility] cancer genetics consultation at the request of Dr. Chapman Fitch due to a family history of cancer.  Patty Ruiz presents to clinic today to discuss the possibility of a hereditary predisposition to cancer, genetic testing, and to further clarify her future cancer risks, as well as potential cancer risks for family members. Patty Ruiz has no personal history of cancer.    HORMONAL RISK FACTORS:  Menarche was at age 62.  First live birth at age 34.  OCP use for approximately 6 years.  Ovaries intact: yes.  Hysterectomy: no.  Menopausal status: premenopausal.  Colonoscopy: yes; normal. Mammogram within the last year: yes. Number of breast biopsies: 0. Up to date with pelvic exams:  yes. Any excessive radiation exposure in the past:  no  Past Medical History  Diagnosis Date   Thyroid disease     hypothyroidism   Hyperlipidemia     Past Surgical History  Procedure Laterality Date   Cholecystectomy     Cervical fusion     Tmj arthroplasty     Laparoscopy      History   Social History   Marital Status: Married    Spouse Name: N/A    Number of Children: N/A   Years of Education: N/A   Social History Main Topics   Smoking status: Never Smoker    Smokeless tobacco: Never Used   Alcohol Use: No   Drug Use: No   Sexual Activity: Not on file   Other Topics Concern   Not on file   Social History Narrative     FAMILY HISTORY:  During the visit, a 4-generation pedigree was obtained. A copy of the pedigree with be scanned into Epic under the Media tab. Significant family history diagnoses include the following: Family History   Problem Relation Age of Onset   Cancer Father 67    melanoma - retinal   Heart disease Maternal Aunt    Heart disease Maternal Uncle    Heart disease Maternal Grandmother    Heart disease Maternal Grandfather    Cancer Paternal Aunt 53    breast cancer- reportedly BRCA+ but report not available at present time   Cancer Paternal Grandfather 10    prostate   Cancer Other     reportedly has 2nd cousin on maternal side that is also BRCA+ but no report available   Patty Ruiz's ancestry is of Caucasian descent. There is no known Jewish ancestry or consanguinity.  GENETIC COUNSELING ASSESSMENT:  Patty Ruiz is a 44 y.o. female with a family history of cancer suggestive of a hereditary predisposition to cancer. She reportedly has a family member with a BRCA mutation on the maternal and paternal sides of the family but no report is available for review. We, therefore, discussed and recommended the following at today's visit.   DISCUSSION:  We reviewed the characteristics, features and inheritance patterns of hereditary cancer syndromes. We also discussed genetic testing, including the appropriate family members to test, the process of testing, insurance coverage and turn-around-time for results. We discussed the implications of a negative, positive and/or variant of uncertain significant result. We recommended Patty Ruiz pursue genetic testing  for the BRCA1 and BRCA2 genes. We have also asked Patty Ruiz to try to obtain a copy of the BRCA results for her relatives that are reportedly positive and are happy to help her with this if needed.  PLAN:  Based on our above recommendation, Patty Ruiz wished to pursue genetic testing and the blood sample was drawn and will be sent to [facility] for analysis. Results should be available within approximately 3 weeks time, at which point they will be disclosed by telephone to Patty Ruiz, as will any additional recommendations warranted by these  results. Lastly, we encouraged Patty Ruiz to remain in contact with cancer genetics annually so that we can continuously update the family history and inform her of any changes in cancer genetics and testing that may be of benefit for this family.   [name] questions were answered to her satisfaction today. Our contact information was provided should additional questions or concerns arise. Thank you for the referral and allowing [place] to share in the care of your patient.   Catherine A. Fine, MS, CGC Certified Psychologist, [email or web address] phone: [phone]  The patient was seen for a total of 45 minutes in face-to-face genetic counseling.  This patient was discussed with Dr. Jana Hakim who agrees with the above.    ______________________________________________________________________ For Office Staff:  Number of people involved in session including genetic counselor: 2 Was an intern or student involved with case: not applicable

## 2014-12-30 ENCOUNTER — Encounter: Payer: Self-pay | Admitting: Genetic Counselor

## 2014-12-30 DIAGNOSIS — Z803 Family history of malignant neoplasm of breast: Secondary | ICD-10-CM

## 2014-12-30 NOTE — Progress Notes (Signed)
[facility]  Genetic Test Results   PRIMARY PROVIDER:  FULP, CAMMIE, MD  PRIMARY REASON FOR VISIT:  Family history of breast cancer  GENETIC TEST RESULT:  Testing Laboratory: [facility]  Test Ordered: BRCA1 and BRCA2 sequencing and rearrangement analysis Date of Report: 12/28/2014 Result: Normal, no pathogenic mutations identified General Interpretation: Reassuring  HPI: Ms. Pergola was previously seen in the [facility] due to concerns regarding a hereditary predisposition to cancer. Please refer to our prior cancer genetics clinic note for more information regarding Ms. Fecher's medical, social and family histories, and our assessment and recommendations, at the time. Ms. Jamerson genetic test results and recommendations warranted by these results were recently disclosed to her and are discussed in more detail below.  GENETIC TEST RESULTS: At the time of Ms. Baetz's visit, we recommended she pursue genetic testing, which includes sequencing and deletion/duplication analysis of the BRCA1 and BRCA2 genes. Genetic testing for these genes was normal and did not reveal a pathogenic mutation in either of these genes. A copy of the genetic test report will be scanned into Epic under the media tab.   We discussed with Ms. Suchecki that current genetic testing is not perfect, and it is, therefore, possible there may be a pathogenic gene mutation in one of these genes that current testing cannot detect, but that chance is small.  We also discussed, that it is possible that another gene that has not yet been discovered, or that we have not yet tested, is responsible for the cancer diagnoses in her family. It is, therefore, important for Ms. Sorter to continue to remain in touch with cancer genetics so that we can continue to offer Ms. Mondo the most up to date genetic testing.   ADDITIONAL GENETIC TESTING: We discussed with Ms. Hoaglund that  there are other genes that are associated with increased cancer risk that can be analyzed. The laboratories that offer such testing look at these additional genes via a hereditary cancer gene panel. Should Ms. Troublefield wish to pursue additional genetic testing, we are happy to discuss and coordinate this testing, at any time.    CANCER SCREENING RECOMMENDATIONS: This normal result is reassuring and indicates that Ms. Sammon does not likely have an increased risk of cancer due to a mutation in one of these genes.  We, therefore, recommended  Ms. Mctier continue to follow the cancer screening guidelines provided by her primary healthcare providers.   RECOMMENDATIONS FOR FAMILY MEMBERS: While these results are reassuring for Ms. Schuld, this test does not tell us anything about Ms. Augustine's siblings' or relatives' risks. We recommended these relatives also have genetic counseling and testing and are happy to help facilitate testing and/or a referral if needed. Cancer genetic counselors can also be located, by visiting the website of the [employer] ([employer][email or web address]) and Field seismologist for a Oncologist by zip code.    FOLLOW-UP: Lastly, we discussed with Ms. Dower that cancer genetics is a rapidly advancing field and it is likely that new genetic tests will be appropriate for her and/or family members in the future. We encouraged her to remain in contact with cancer genetics on an annual basis so we can update her personal and family histories and let her know of advances in cancer genetics that may benefit this family.   Our contact number was provided. Ms. Lemmons questions were answered to her satisfaction, and she knows she is welcome  to call us at anytime with additional questions or concerns.    Catherine A. Fine, MS, CGC Certified Psychologist, [email or web address] Phone: [phone]

## 2015-03-09 ENCOUNTER — Encounter (HOSPITAL_BASED_OUTPATIENT_CLINIC_OR_DEPARTMENT_OTHER): Payer: Self-pay | Admitting: *Deleted

## 2015-03-10 ENCOUNTER — Encounter (HOSPITAL_BASED_OUTPATIENT_CLINIC_OR_DEPARTMENT_OTHER): Payer: Self-pay | Admitting: *Deleted

## 2015-03-10 NOTE — H&P (Addendum)
Patty Ruiz is an 44 y.o. female G3P1 presents for definitive surgical mngt of menorrhagia & dysmenorrhea.  Pt has not gotten relief with IUD or OCPs.     No LMP recorded.    Past Medical History  Diagnosis Date  . Thyroid disease     hypothyroidism  . Hyperlipidemia   . Dysmenorrhea   . Menorrhagia   . Endometriosis   . Family history of breast cancer   . PCOD (polycystic ovarian disease)     Past Surgical History  Procedure Laterality Date  . Cervical fusion    . Tmj arthroplasty    . Cholecystectomy  2004  ([place], [place])  . Laparoscopy  x2  last one 2011 [facility])    endometriosis  . Exicision axillary  mass Left 11-15-2012  . Dx laparosocpy for pelvic pain/   repair umbilical hernia with mesh  06-13-2012    Family History  Problem Relation Age of Onset  . Cancer Father 62    melanoma - retinal  . Heart disease Maternal Aunt   . Heart disease Maternal Uncle   . Heart disease Maternal Grandmother   . Heart disease Maternal Grandfather   . Cancer Paternal Aunt 64    breast cancer- reportedly BRCA+ but report not available at present time  . Cancer Paternal Grandfather 32    prostate  . Cancer Other     reportedly has 2nd cousin on maternal side that is also BRCA+ but no report available    Social History:  reports that she has never smoked. She has never used smokeless tobacco. She reports that she does not drink alcohol or use illicit drugs.  Allergies:  Allergies  Allergen Reactions  . Ciprofloxacin Shortness Of Breath and Swelling  . Codeine Shortness Of Breath and Swelling  . Hydrocodone Shortness Of Breath, Swelling and Other (See Comments)    Severe abdominal cramps, causes what appears to be pancreatitis.  . Morphine And Related Shortness Of Breath, Swelling and Other (See Comments)    Severe abdominal cramps, causes what appears to be pancreatitis.  . Adhesive [Tape] Rash    Looks like poison oak where applied    No prescriptions prior  to admission    ROS  Physical Exam  AF, VSS Gen - NAD Abd - soft, NT/ND CV - RRR Lungs - clear Ext - NT, no edema PV - uterus mobile, mildly tender.  No adnexal masses  [place]:  No uterine or adnexal masses.  No intracavitary masses or free fluid.  Assessment/Plan:  Menorrhagia/Dysmenorrhea LAVH/BS R/b/a discussed, questions answered, informed consent  Daphyne Miguez 03/10/2015, 1:25 PM

## 2015-03-10 NOTE — Progress Notes (Signed)
NPO AFTER MN. ARRIVE AT 0600. NEEDS HG , URINE PREG. AND T & S.  WILL TAKE TIROSINT AM DOS W/ SIPS OF WATER AND IF NEEDED MAY TAKE TYLENOL. PT AWARE OWER AT MAIN.

## 2015-03-12 NOTE — Anesthesia Preprocedure Evaluation (Addendum)
Anesthesia Evaluation  Patient identified by MRN, date of birth, ID band Patient awake    Reviewed: Allergy & Precautions, H&P , NPO status , Patient's Chart, lab work & pertinent test results  History of Anesthesia Complications (+) PONV  Airway Mallampati: II  TM Distance: >3 FB Neck ROM: full    Dental  (+) Dental Advisory Given, Caps Veneers on all upper front teeth:   Pulmonary neg pulmonary ROS,  breath sounds clear to auscultation  Pulmonary exam normal       Cardiovascular Exercise Tolerance: Good negative cardio ROS  Rhythm:regular Rate:Normal     Neuro/Psych Cervical fusion negative neurological ROS  negative psych ROS   GI/Hepatic negative GI ROS, Neg liver ROS,   Endo/Other  negative endocrine ROSHypothyroidism   Renal/GU negative Renal ROS  negative genitourinary   Musculoskeletal   Abdominal   Peds  Hematology negative hematology ROS (+)   Anesthesia Other Findings   Reproductive/Obstetrics negative OB ROS                            Anesthesia Physical Anesthesia Plan  ASA: II  Anesthesia Plan: General   Post-op Pain Management:    Induction: Intravenous  Airway Management Planned: Oral ETT  Additional Equipment:   Intra-op Plan:   Post-operative Plan: Extubation in OR  Informed Consent: I have reviewed the patients History and Physical, chart, labs and discussed the procedure including the risks, benefits and alternatives for the proposed anesthesia with the patient or authorized representative who has indicated his/her understanding and acceptance.   Dental Advisory Given  Plan Discussed with: CRNA and Surgeon  Anesthesia Plan Comments:         Anesthesia Quick Evaluation

## 2015-03-15 ENCOUNTER — Ambulatory Visit (HOSPITAL_BASED_OUTPATIENT_CLINIC_OR_DEPARTMENT_OTHER): Payer: 59 | Admitting: Anesthesiology

## 2015-03-15 ENCOUNTER — Encounter (HOSPITAL_COMMUNITY)
Admission: RE | Disposition: A | Discharge status: Home / Self Care | Payer: Self-pay | Source: Ambulatory Visit | Attending: Obstetrics and Gynecology

## 2015-03-15 ENCOUNTER — Encounter (HOSPITAL_BASED_OUTPATIENT_CLINIC_OR_DEPARTMENT_OTHER): Payer: Self-pay

## 2015-03-15 ENCOUNTER — Observation Stay (HOSPITAL_BASED_OUTPATIENT_CLINIC_OR_DEPARTMENT_OTHER)
Admission: RE | Admit: 2015-03-15 | Discharge: 2015-03-16 | Disposition: A | Discharge status: Home / Self Care | Payer: 59 | Source: Ambulatory Visit | Attending: Obstetrics and Gynecology | Admitting: Obstetrics and Gynecology

## 2015-03-15 DIAGNOSIS — Z803 Family history of malignant neoplasm of breast: Secondary | ICD-10-CM | POA: Diagnosis not present

## 2015-03-15 DIAGNOSIS — E039 Hypothyroidism, unspecified: Secondary | ICD-10-CM | POA: Diagnosis not present

## 2015-03-15 DIAGNOSIS — N920 Excessive and frequent menstruation with regular cycle: Principal | ICD-10-CM | POA: Insufficient documentation

## 2015-03-15 DIAGNOSIS — Z885 Allergy status to narcotic agent status: Secondary | ICD-10-CM | POA: Insufficient documentation

## 2015-03-15 DIAGNOSIS — N946 Dysmenorrhea, unspecified: Secondary | ICD-10-CM | POA: Insufficient documentation

## 2015-03-15 DIAGNOSIS — E282 Polycystic ovarian syndrome: Secondary | ICD-10-CM | POA: Insufficient documentation

## 2015-03-15 DIAGNOSIS — Z881 Allergy status to other antibiotic agents status: Secondary | ICD-10-CM | POA: Diagnosis not present

## 2015-03-15 DIAGNOSIS — E785 Hyperlipidemia, unspecified: Secondary | ICD-10-CM | POA: Diagnosis not present

## 2015-03-15 DIAGNOSIS — N800 Endometriosis of uterus: Secondary | ICD-10-CM | POA: Diagnosis not present

## 2015-03-15 DIAGNOSIS — N838 Other noninflammatory disorders of ovary, fallopian tube and broad ligament: Secondary | ICD-10-CM | POA: Diagnosis not present

## 2015-03-15 HISTORY — DX: Dysmenorrhea, unspecified: N94.6

## 2015-03-15 HISTORY — DX: Other specified postprocedural states: Z98.890

## 2015-03-15 HISTORY — DX: Hypothyroidism, unspecified: E03.9

## 2015-03-15 HISTORY — PX: LAPAROSCOPIC ASSISTED VAGINAL HYSTERECTOMY: SHX5398

## 2015-03-15 HISTORY — DX: Non-celiac gluten sensitivity: K90.41

## 2015-03-15 HISTORY — DX: Family history of malignant neoplasm of breast: Z80.3

## 2015-03-15 HISTORY — DX: Other specified postprocedural states: R11.2

## 2015-03-15 HISTORY — PX: BILATERAL SALPINGECTOMY: SHX5743

## 2015-03-15 HISTORY — DX: Excessive and frequent menstruation with regular cycle: N92.0

## 2015-03-15 HISTORY — DX: Polycystic ovarian syndrome: E28.2

## 2015-03-15 HISTORY — DX: Personal history of traumatic brain injury: Z87.820

## 2015-03-15 HISTORY — DX: Nausea with vomiting, unspecified: R11.2

## 2015-03-15 HISTORY — DX: Circadian rhythm sleep disorder, unspecified type: G47.20

## 2015-03-15 HISTORY — DX: Endometriosis, unspecified: N80.9

## 2015-03-15 LAB — TYPE AND SCREEN
ABO/RH(D): A POS
Antibody Screen: NEGATIVE

## 2015-03-15 LAB — POCT HEMOGLOBIN-HEMACUE: Hemoglobin: 13.4 g/dL (ref 12.0–15.0)

## 2015-03-15 LAB — POCT PREGNANCY, URINE: Preg Test, Ur: NEGATIVE

## 2015-03-15 LAB — ABO/RH: ABO/RH(D): A POS

## 2015-03-15 SURGERY — HYSTERECTOMY, VAGINAL, LAPAROSCOPY-ASSISTED
Anesthesia: General | Site: Abdomen

## 2015-03-15 MED ORDER — KETOROLAC TROMETHAMINE 30 MG/ML IJ SOLN
INTRAMUSCULAR | Status: DC | PRN
  Administered 2015-03-15: 30 mg via INTRAVENOUS

## 2015-03-15 MED ORDER — ONDANSETRON HCL 4 MG/2ML IJ SOLN
INTRAMUSCULAR | Status: DC | PRN
  Administered 2015-03-15: 4 mg via INTRAVENOUS

## 2015-03-15 MED ORDER — ONDANSETRON HCL 4 MG/2ML IJ SOLN
4.0000 mg | Freq: Four times a day (QID) | INTRAMUSCULAR | Status: DC | PRN
  Administered 2015-03-15: 4 mg via INTRAVENOUS
  Filled 2015-03-15: qty 2

## 2015-03-15 MED ORDER — MIDAZOLAM HCL 5 MG/5ML IJ SOLN
INTRAMUSCULAR | Status: DC | PRN
  Administered 2015-03-15: 2 mg via INTRAVENOUS

## 2015-03-15 MED ORDER — DEXTROSE IN LACTATED RINGERS 5 % IV SOLN
INTRAVENOUS | Status: DC
  Administered 2015-03-15 – 2015-03-16 (×3): via INTRAVENOUS

## 2015-03-15 MED ORDER — STERILE WATER FOR IRRIGATION IR SOLN
Status: DC | PRN
  Administered 2015-03-15: 500 mL

## 2015-03-15 MED ORDER — FENTANYL CITRATE 0.05 MG/ML IJ SOLN
INTRAMUSCULAR | Status: AC
  Filled 2015-03-15: qty 8

## 2015-03-15 MED ORDER — DIPHENHYDRAMINE HCL 25 MG PO CAPS
25.0000 mg | ORAL_CAPSULE | ORAL | Status: DC | PRN
  Administered 2015-03-15 – 2015-03-16 (×3): 25 mg via ORAL
  Filled 2015-03-15 (×3): qty 1

## 2015-03-15 MED ORDER — SCOPOLAMINE 1 MG/3DAYS TD PT72
MEDICATED_PATCH | TRANSDERMAL | Status: DC | PRN
  Administered 2015-03-15: 1 via TRANSDERMAL

## 2015-03-15 MED ORDER — DEXTROSE 5 % IV SOLN
2.0000 g | INTRAVENOUS | Status: AC
  Administered 2015-03-15: 2 g via INTRAVENOUS
  Filled 2015-03-15: qty 2

## 2015-03-15 MED ORDER — FENTANYL CITRATE 0.05 MG/ML IJ SOLN
25.0000 ug | INTRAMUSCULAR | Status: DC | PRN
  Filled 2015-03-15: qty 2

## 2015-03-15 MED ORDER — MIDAZOLAM HCL 2 MG/2ML IJ SOLN
INTRAMUSCULAR | Status: AC
  Filled 2015-03-15: qty 2

## 2015-03-15 MED ORDER — HYDROMORPHONE HCL 1 MG/ML IJ SOLN
INTRAMUSCULAR | Status: AC
  Filled 2015-03-15: qty 1

## 2015-03-15 MED ORDER — CEFOTETAN DISODIUM-DEXTROSE 2-2.08 GM-% IV SOLR
INTRAVENOUS | Status: AC
  Filled 2015-03-15: qty 50

## 2015-03-15 MED ORDER — LACTATED RINGERS IR SOLN
Status: DC | PRN
  Administered 2015-03-15: 3000 mL

## 2015-03-15 MED ORDER — FENTANYL CITRATE 0.05 MG/ML IJ SOLN
INTRAMUSCULAR | Status: AC
  Filled 2015-03-15: qty 2

## 2015-03-15 MED ORDER — GLYCOPYRROLATE 0.2 MG/ML IJ SOLN
INTRAMUSCULAR | Status: DC | PRN
  Administered 2015-03-15: 0.6 mg via INTRAVENOUS

## 2015-03-15 MED ORDER — LACTATED RINGERS IV SOLN
INTRAVENOUS | Status: DC
  Administered 2015-03-15 (×2): via INTRAVENOUS
  Filled 2015-03-15: qty 1000

## 2015-03-15 MED ORDER — NEOSTIGMINE METHYLSULFATE 10 MG/10ML IV SOLN
INTRAVENOUS | Status: DC | PRN
  Administered 2015-03-15: 4 mg via INTRAVENOUS

## 2015-03-15 MED ORDER — LACTATED RINGERS IV SOLN
INTRAVENOUS | Status: DC
  Filled 2015-03-15: qty 1000

## 2015-03-15 MED ORDER — SCOPOLAMINE 1 MG/3DAYS TD PT72
MEDICATED_PATCH | TRANSDERMAL | Status: AC
  Filled 2015-03-15: qty 1

## 2015-03-15 MED ORDER — SULFAMETHOXAZOLE-TRIMETHOPRIM 800-160 MG PO TABS
1.0000 | ORAL_TABLET | Freq: Two times a day (BID) | ORAL | Status: DC
  Administered 2015-03-15 – 2015-03-16 (×3): 1 via ORAL
  Filled 2015-03-15 (×4): qty 1

## 2015-03-15 MED ORDER — FENTANYL CITRATE 0.05 MG/ML IJ SOLN
INTRAMUSCULAR | Status: DC | PRN
  Administered 2015-03-15 (×6): 50 ug via INTRAVENOUS

## 2015-03-15 MED ORDER — OXYCODONE-ACETAMINOPHEN 5-325 MG PO TABS
1.0000 | ORAL_TABLET | ORAL | Status: DC | PRN
  Administered 2015-03-15 – 2015-03-16 (×2): 2 via ORAL
  Filled 2015-03-15 (×2): qty 2

## 2015-03-15 MED ORDER — ONDANSETRON HCL 4 MG PO TABS: 4.0000 mg | ORAL_TABLET | Freq: Four times a day (QID) | ORAL | Status: DC | PRN

## 2015-03-15 MED ORDER — LIDOCAINE HCL (CARDIAC) 20 MG/ML IV SOLN
INTRAVENOUS | Status: DC | PRN
  Administered 2015-03-15: 70 mg via INTRAVENOUS

## 2015-03-15 MED ORDER — ROCURONIUM BROMIDE 100 MG/10ML IV SOLN
INTRAVENOUS | Status: DC | PRN
  Administered 2015-03-15: 40 mg via INTRAVENOUS

## 2015-03-15 MED ORDER — MENTHOL 3 MG MT LOZG: 1.0000 | LOZENGE | OROMUCOSAL | Status: DC | PRN

## 2015-03-15 MED ORDER — KETOROLAC TROMETHAMINE 30 MG/ML IJ SOLN
30.0000 mg | Freq: Three times a day (TID) | INTRAMUSCULAR | Status: AC
  Administered 2015-03-15 – 2015-03-16 (×2): 30 mg via INTRAVENOUS
  Filled 2015-03-15 (×3): qty 1

## 2015-03-15 MED ORDER — LEVOTHYROXINE SODIUM 112 MCG PO TABS
112.0000 ug | ORAL_TABLET | Freq: Every day | ORAL | Status: DC
  Filled 2015-03-15 (×2): qty 1

## 2015-03-15 MED ORDER — BUPIVACAINE HCL (PF) 0.25 % IJ SOLN
INTRAMUSCULAR | Status: DC | PRN
  Administered 2015-03-15: 3 mL

## 2015-03-15 MED ORDER — FENTANYL CITRATE 0.05 MG/ML IJ SOLN
25.0000 ug | INTRAMUSCULAR | Status: DC | PRN
  Administered 2015-03-15: 25 ug via INTRAVENOUS
  Filled 2015-03-15: qty 1

## 2015-03-15 MED ORDER — OXYMETAZOLINE HCL 0.05 % NA SOLN
NASAL | Status: DC | PRN
  Administered 2015-03-15: 2 via NASAL

## 2015-03-15 MED ORDER — DEXAMETHASONE SODIUM PHOSPHATE 4 MG/ML IJ SOLN
INTRAMUSCULAR | Status: DC | PRN
  Administered 2015-03-15: 10 mg via INTRAVENOUS

## 2015-03-15 MED ORDER — SODIUM CHLORIDE 0.9 % IR SOLN
Status: DC | PRN
  Administered 2015-03-15: 500 mL

## 2015-03-15 MED ORDER — METOCLOPRAMIDE HCL 5 MG/ML IJ SOLN
INTRAMUSCULAR | Status: DC | PRN
  Administered 2015-03-15: 10 mg via INTRAVENOUS

## 2015-03-15 MED ORDER — PROPOFOL 10 MG/ML IV BOLUS
INTRAVENOUS | Status: DC | PRN
  Administered 2015-03-15: 200 mg via INTRAVENOUS

## 2015-03-15 MED ORDER — EPHEDRINE SULFATE 50 MG/ML IJ SOLN
INTRAMUSCULAR | Status: DC | PRN
  Administered 2015-03-15: 10 mg via INTRAVENOUS

## 2015-03-15 MED ORDER — HYDROMORPHONE HCL 1 MG/ML IJ SOLN
1.0000 mg | INTRAMUSCULAR | Status: DC | PRN
Start: 2015-03-15 — End: 2015-03-16
  Administered 2015-03-15 – 2015-03-16 (×8): 1 mg via INTRAVENOUS
  Filled 2015-03-15 (×7): qty 1

## 2015-03-15 MED ORDER — ACETAMINOPHEN 10 MG/ML IV SOLN
INTRAVENOUS | Status: DC | PRN
  Administered 2015-03-15: 1000 mg via INTRAVENOUS

## 2015-03-15 MED ORDER — LIDOCAINE HCL 4 % MT SOLN
OROMUCOSAL | Status: DC | PRN
  Administered 2015-03-15: 3 mL via TOPICAL

## 2015-03-15 SURGICAL SUPPLY — 65 items
APPLICATOR COTTON TIP 6IN STRL (MISCELLANEOUS) ×3 IMPLANT
BAG URINE DRAINAGE (UROLOGICAL SUPPLIES) ×3 IMPLANT
BANDAGE ADHESIVE 1X3 (GAUZE/BANDAGES/DRESSINGS) IMPLANT
BARRIER ADHS 3X4 INTERCEED (GAUZE/BANDAGES/DRESSINGS) IMPLANT
BLADE SURG 10 STRL SS (BLADE) ×3 IMPLANT
BLADE SURG 11 STRL SS (BLADE) ×3 IMPLANT
CANISTER SUCTION 2500CC (MISCELLANEOUS) ×3 IMPLANT
CATH FOLEY 2WAY SLVR  5CC 14FR (CATHETERS) ×1
CATH FOLEY 2WAY SLVR 5CC 14FR (CATHETERS) ×2 IMPLANT
CHLORAPREP W/TINT 26ML (MISCELLANEOUS) ×3 IMPLANT
CLEANER CAUTERY TIP 5X5 PAD (MISCELLANEOUS) ×2 IMPLANT
COVER BACK TABLE 60X90IN (DRAPES) ×6 IMPLANT
DRAPE LG THREE QUARTER DISP (DRAPES) ×3 IMPLANT
DRAPE UNDERBUTTOCKS STRL (DRAPE) ×3 IMPLANT
DRSG TEGADERM 2-3/8X2-3/4 SM (GAUZE/BANDAGES/DRESSINGS) IMPLANT
ELECT REM PT RETURN 9FT ADLT (ELECTROSURGICAL) ×3
ELECTRODE REM PT RTRN 9FT ADLT (ELECTROSURGICAL) ×2 IMPLANT
GLOVE BIO SURGEON STRL SZ 6.5 (GLOVE) ×9 IMPLANT
GLOVE BIOGEL PI IND STRL 7.0 (GLOVE) ×2 IMPLANT
GLOVE BIOGEL PI IND STRL 7.5 (GLOVE) ×4 IMPLANT
GLOVE BIOGEL PI INDICATOR 7.0 (GLOVE) ×1
GLOVE BIOGEL PI INDICATOR 7.5 (GLOVE) ×2
GLOVE ECLIPSE 6.5 STRL STRAW (GLOVE) ×3 IMPLANT
GLOVE ECLIPSE 7.0 STRL STRAW (GLOVE) ×3 IMPLANT
GLOVE LITE  25/BX (GLOVE) ×6 IMPLANT
GLOVE SURG SS PI 7.5 STRL IVOR (GLOVE) ×3 IMPLANT
GOWN STRL REUS W/ TWL LRG LVL3 (GOWN DISPOSABLE) ×8 IMPLANT
GOWN STRL REUS W/TWL LRG LVL3 (GOWN DISPOSABLE) ×4
HOLDER FOLEY CATH W/STRAP (MISCELLANEOUS) ×3 IMPLANT
LIQUID BAND (GAUZE/BANDAGES/DRESSINGS) ×3 IMPLANT
NEEDLE HYPO 25X1 1.5 SAFETY (NEEDLE) ×3 IMPLANT
NEEDLE SPNL 22GX3.5 QUINCKE BK (NEEDLE) ×3 IMPLANT
NS IRRIG 500ML POUR BTL (IV SOLUTION) ×3 IMPLANT
PACK BASIN DAY SURGERY FS (CUSTOM PROCEDURE TRAY) ×3 IMPLANT
PAD CLEANER CAUTERY TIP 5X5 (MISCELLANEOUS) ×1
PAD OB MATERNITY 4.3X12.25 (PERSONAL CARE ITEMS) ×3 IMPLANT
PAD PREP 24X48 CUFFED NSTRL (MISCELLANEOUS) ×3 IMPLANT
PENCIL BUTTON HOLSTER BLD 10FT (ELECTRODE) ×3 IMPLANT
SEALER TISSUE G2 CVD JAW 45CM (ENDOMECHANICALS) ×3 IMPLANT
SET IRRIG TUBING LAPAROSCOPIC (IRRIGATION / IRRIGATOR) ×3 IMPLANT
SHEET LAVH (DRAPES) ×3 IMPLANT
SOLUTION ANTI FOG 6CC (MISCELLANEOUS) ×3 IMPLANT
SPONGE LAP 4X18 X RAY DECT (DISPOSABLE) ×3 IMPLANT
STRIP CLOSURE SKIN 1/4X4 (GAUZE/BANDAGES/DRESSINGS) IMPLANT
SUT MNCRL 0 MO-4 VIOLET 18 CR (SUTURE) ×4 IMPLANT
SUT MON AB 2-0 CT1 36 (SUTURE) ×3 IMPLANT
SUT MONOCRYL 0 MO 4 18  CR/8 (SUTURE) ×2
SUT VIC AB 3-0 PS2 18 (SUTURE) ×1
SUT VIC AB 3-0 PS2 18XBRD (SUTURE) ×2 IMPLANT
SUT VIC AB 3-0 SH 27 (SUTURE) ×1
SUT VIC AB 3-0 SH 27X BRD (SUTURE) ×2 IMPLANT
SUT VICRYL 0 TIES 12 18 (SUTURE) ×3 IMPLANT
SUT VICRYL 0 UR6 27IN ABS (SUTURE) ×3 IMPLANT
SYR BULB IRRIGATION 50ML (SYRINGE) ×3 IMPLANT
SYR CONTROL 10ML LL (SYRINGE) ×3 IMPLANT
SYRINGE 10CC LL (SYRINGE) ×3 IMPLANT
TOWEL OR 17X24 6PK STRL BLUE (TOWEL DISPOSABLE) ×6 IMPLANT
TRAY DSU PREP LF (CUSTOM PROCEDURE TRAY) ×3 IMPLANT
TROCAR OPTI TIP 5M 100M (ENDOMECHANICALS) ×3 IMPLANT
TROCAR XCEL NON-BLD 11X100MML (ENDOMECHANICALS) ×3 IMPLANT
TUBE CONNECTING 12X1/4 (SUCTIONS) ×6 IMPLANT
TUBING INSUFFLATION 10FT LAP (TUBING) ×3 IMPLANT
WARMER LAPAROSCOPE (MISCELLANEOUS) ×3 IMPLANT
WATER STERILE IRR 500ML POUR (IV SOLUTION) ×3 IMPLANT
YANKAUER SUCT BULB TIP NO VENT (SUCTIONS) ×3 IMPLANT

## 2015-03-15 NOTE — Anesthesia Postprocedure Evaluation (Signed)
  Anesthesia Post-op Note  Patient: Patty Ruiz  Procedure(s) Performed: Procedure(s) (LRB): LAPAROSCOPIC ASSISTED VAGINAL HYSTERECTOMY (N/A) BILATERAL SALPINGECTOMY (Bilateral)  Patient Location: PACU  Anesthesia Type: General  Level of Consciousness: awake and alert   Airway and Oxygen Therapy: Patient Spontanous Breathing  Post-op Pain: mild  Post-op Assessment: Post-op Vital signs reviewed, Patient's Cardiovascular Status Stable, Respiratory Function Stable, Patent Airway and No signs of Nausea or vomiting  Last Vitals:  Filed Vitals:   03/15/15 1019  BP: 123/73  Pulse: 75  Temp:   Resp: 16    Post-op Vital Signs: stable   Complications: No apparent anesthesia complications

## 2015-03-15 NOTE — Anesthesia Procedure Notes (Signed)
Procedure Name: Intubation Date/Time: 03/15/2015 7:36 AM Performed by: Mechele Claude Pre-anesthesia Checklist: Patient identified, Emergency Drugs available, Suction available and Patient being monitored Patient Re-evaluated:Patient Re-evaluated prior to inductionOxygen Delivery Method: Circle System Utilized Preoxygenation: Pre-oxygenation with 100% oxygen Intubation Type: IV induction Ventilation: Mask ventilation without difficulty Laryngoscope Size: Mac and 3 Grade View: Grade I Tube type: Oral Tube size: 7.0 mm Number of attempts: 1 Airway Equipment and Method: Stylet and LTA kit utilized Placement Confirmation: ETT inserted through vocal cords under direct vision,  positive ETCO2 and breath sounds checked- equal and bilateral Secured at: 20 cm Tube secured with: Tape Dental Injury: Teeth and Oropharynx as per pre-operative assessment

## 2015-03-15 NOTE — Transfer of Care (Signed)
Last Vitals:  Filed Vitals:   03/15/15 0649  BP: 111/68  Pulse: 82  Temp: 36.9 C  Resp: 16    Immediate Anesthesia Transfer of Care Note  Patient: Patty Ruiz  Procedure(s) Performed: Procedure(s) (LRB): LAPAROSCOPIC ASSISTED VAGINAL HYSTERECTOMY (N/A) BILATERAL SALPINGECTOMY (Bilateral)  Patient Location: PACU  Anesthesia Type: General  Level of Consciousness: awake, alert  and oriented  Airway & Oxygen Therapy: Patient Spontanous Breathing and Patient connected to face mask oxygen  Post-op Assessment: Report given to PACU RN and Post -op Vital signs reviewed and stable  Post vital signs: Reviewed and stable  Complications: No apparent anesthesia complications

## 2015-03-15 NOTE — Progress Notes (Signed)
Day of Surgery Procedure(s) (LRB): LAPAROSCOPIC ASSISTED VAGINAL HYSTERECTOMY (N/A) BILATERAL SALPINGECTOMY (Bilateral)  Subjective: Patient reports incisional pain and tolerating PO.  Pain controlled with IV dilaudid.  No CP or SOB.    Objective: I have reviewed patient's vital signs, intake and output and medications.  General: alert and cooperative GI: normal findings: soft, non-tender Extremities: extremities normal, atraumatic, no cyanosis or edema Vaginal Bleeding: none  Assessment: s/p Procedure(s): LAPAROSCOPIC ASSISTED VAGINAL HYSTERECTOMY (N/A) BILATERAL SALPINGECTOMY (Bilateral): stable  Plan: Advance diet Encourage ambulation Clear liquids  Continue routine post op care CBC in am     Patty Ruiz 03/15/2015, 1:30 PM

## 2015-03-16 ENCOUNTER — Encounter (HOSPITAL_BASED_OUTPATIENT_CLINIC_OR_DEPARTMENT_OTHER): Payer: Self-pay | Admitting: Obstetrics and Gynecology

## 2015-03-16 DIAGNOSIS — N920 Excessive and frequent menstruation with regular cycle: Secondary | ICD-10-CM | POA: Diagnosis not present

## 2015-03-16 LAB — CBC
HCT: 33.5 % — ABNORMAL LOW (ref 36.0–46.0)
Hemoglobin: 10.8 g/dL — ABNORMAL LOW (ref 12.0–15.0)
MCH: 26.8 pg (ref 26.0–34.0)
MCHC: 32.2 g/dL (ref 30.0–36.0)
MCV: 83.1 fL (ref 78.0–100.0)
PLATELETS: 269 10*3/uL (ref 150–400)
RBC: 4.03 MIL/uL (ref 3.87–5.11)
RDW: 13.7 % (ref 11.5–15.5)
WBC: 9.2 10*3/uL (ref 4.0–10.5)

## 2015-03-16 MED ORDER — OXYCODONE-ACETAMINOPHEN 5-325 MG PO TABS: 1.0000 | ORAL_TABLET | ORAL | Status: DC | PRN

## 2015-03-16 MED ORDER — IBUPROFEN 600 MG PO TABS: 600.0000 mg | ORAL_TABLET | Freq: Four times a day (QID) | ORAL | Status: DC | PRN

## 2015-03-16 NOTE — Discharge Summary (Signed)
Physician Discharge Summary  Patient ID: Patty Ruiz MRN: [id number] DOB/AGE: 08/23/1971 44 y.o.  Admit date: 03/15/2015 Discharge date: 03/16/2015  Admission Diagnoses:  Menorrhagia, dysmenorrhea  Discharge Diagnoses:  Active Problems:   Menorrhagia   Discharged Condition: stable  Hospital Course: Pt was admitted for postop care.  Initially her pain was controlled with IV medications and foley catheter was in place.  Once she was able to ambulate, foley was removed.  As she was able to tolerate PO intake, her pain was controlled with PO meds.  Vitals and labs remained stable.  UOP was good.    Consults: None  Significant Diagnostic Studies: labs: cbc  Treatments: surgery: LAVH/BS  Discharge Exam: Blood pressure 118/63, pulse 68, temperature 98.3 F (36.8 C), temperature source Oral, resp. rate 18, height 5\' 4"  (1.626 m), weight 163 lb (73.936 kg), last menstrual period 02/18/2015, SpO2 100 %. General appearance: alert and cooperative GI: normal findings: soft, non-tender Extremities: extremities normal, atraumatic, no cyanosis or edema Incision/Wound:c/d/i  Disposition: 01-Home or Self Care     Medication List    STOP taking these medications        acetaminophen 500 MG tablet  Commonly known as:  TYLENOL      TAKE these medications        FISH OIL PO  Take by mouth daily. LIQUID--  ONE TEASPOON     ibuprofen 600 MG tablet  Commonly known as:  ADVIL  Take 1 tablet (600 mg total) by mouth every 6 (six) hours as needed.     Levothyroxine Sodium 112 MCG Caps  Take 112 mcg by mouth daily before breakfast. Name Brand--  TIROSINT     multivitamin with minerals tablet  Take 1 tablet by mouth daily.     oxyCODONE-acetaminophen 5-325 MG per tablet  Commonly known as:  PERCOCET/ROXICET  Take 1-2 tablets by mouth every 4 (four) hours as needed for moderate pain.     sulfamethoxazole-trimethoprim 800-160 MG per tablet  Commonly known as:  BACTRIM DS,SEPTRA DS   Take 1 tablet by mouth 2 (two) times daily.     traZODone 100 MG tablet  Commonly known as:  DESYREL  Take 100 mg by mouth at bedtime.           Follow-up Information    Schedule an appointment as soon as possible for a visit in 2 weeks to follow up.      Signed: Khushbu Pippen 03/16/2015, 8:29 AM

## 2015-03-16 NOTE — Progress Notes (Signed)
UR completed 

## 2015-03-16 NOTE — Discharge Instructions (Signed)

## 2015-03-16 NOTE — Progress Notes (Signed)
Patient is alert and oriented, vital signs are stable, incisions are within normal limits, patient tolerating her diet, percocet adequate for pain control, patient to follow up with Julien Girt in 2 weeks Neta Mends RN 03-16-2015 11:41am

## 2015-03-19 ENCOUNTER — Encounter (HOSPITAL_COMMUNITY): Payer: Self-pay | Admitting: Obstetrics and Gynecology

## 2015-03-19 ENCOUNTER — Inpatient Hospital Stay (HOSPITAL_COMMUNITY)
Admission: AD | Admit: 2015-03-19 | Discharge: 2015-03-19 | Disposition: A | Discharge status: Home / Self Care | Payer: 59 | Source: Ambulatory Visit | Attending: Obstetrics and Gynecology | Admitting: Obstetrics and Gynecology

## 2015-03-19 DIAGNOSIS — G8918 Other acute postprocedural pain: Secondary | ICD-10-CM

## 2015-03-19 MED ORDER — OXYCODONE-ACETAMINOPHEN 5-325 MG PO TABS: 1.0000 | ORAL_TABLET | ORAL | Status: DC | PRN

## 2015-03-19 NOTE — MAU Note (Signed)
Noni Saupe NP in Triage to see pt. Pt d/c home from Triage

## 2015-03-19 NOTE — Discharge Instructions (Signed)
Pain Relief Preoperatively and Postoperatively °Being a good patient does not mean being a silent one. If you have questions, problems, or concerns about the pain you may feel after surgery, let your caregiver know. Patients have the right to assessment and management of pain. The treatment of pain after surgery is important to speed up recovery and return to normal activities. Severe pain after surgery, and the fear or anxiety associated with that pain, may cause extreme discomfort that: °· Prevents sleep. °· Decreases the ability to breathe deeply and cough. This can cause pneumonia or other upper airway infections. °· Causes your heart to beat faster and your blood pressure to be higher. °· Increases the risk for constipation and bloating. °· Decreases the ability of wounds to heal. °· May result in depression, increased anxiety, and feelings of helplessness. °Relief of pain before surgery is also important because it will lessen the pain after surgery. Patients who receive both pain relief before and after surgery experience greater pain relief than those who only receive pain relief after surgery. Let your caregiver know if you are having uncontrolled pain. This is very important. Pain after surgery is more difficult to manage if it is permitted to become severe, so prompt and adequate treatment of acute pain is necessary. °PAIN CONTROL METHODS °Your caregivers follow policies and procedures about the management of patient pain. These guidelines should be explained to you before surgery. Plans for pain control after surgery must be mutually decided upon and instituted with your full understanding and agreement. Do not be afraid to ask questions regarding the care you are receiving. There are many different ways your caregivers will attempt to control your pain, including the following methods. °As needed pain control °· You may be given pain medicine either through your intravenous (IV) tube, or as a pill or  liquid you can swallow. You will need to let your caregiver know when you are having pain. Then, your caregiver will give you the pain medicine ordered for you. °· Your pain medicine may make you constipated. If constipation occurs, drink more liquids if you can. Your caregiver may have you take a mild laxative. °IV patient-controlled analgesia pump (PCA pump) °· You can get your pain medicine through the IV tube which goes into your vein. You are able to control the amount of pain medicine that you get. The pain medicine flows in through an IV tube and is controlled by a pump. This pump gives you a set amount of pain medicine when you push the button hooked up to it. Nobody should push this button but you or someone specifically assigned by you to do so. It is set up to keep you from accidentally giving yourself too much pain medicine. You will be able to start using your pain pump in the recovery room after your surgery. This method can be helpful for most types of surgery. °· If you are still having too much pain, tell your caregiver. Also, tell your caregiver if you are feeling too sleepy or nauseous. °Continuous epidural pain control °· A thin, soft tube (catheter) is put into your back. Pain medicine flows through the catheter to lessen pain in the part of your body where the surgery is done. Continuous epidural pain control may work best for you if you are having surgery on your chest, abdomen, hip area, or legs. The epidural catheter is usually put into your back just before surgery. The catheter is left in until you can eat and take medicine by mouth. In most cases,   this may take 2 to 3 days. °· Giving pain medicine through the epidural catheter may help you heal faster because: °¨ Your bowel gets back to normal faster. °¨ You can get back to eating sooner. °¨ You can be up and walking sooner. °Medicine that numbs the area (local anesthetic) °· You may receive an injection of pain medicine near where the  pain is (local infiltration). °· You may receive an injection of pain medicine near the nerve that controls the sensation to a specific part of the body (peripheral nerve block). °· Medicine may be put in the spine to block pain (spinal block). °Opioids °· Moderate to moderately severe acute pain after surgery may respond to opioids. Opioids are narcotic pain medicine. Opioids are often combined with non-narcotic medicines to improve pain relief, diminish the risk of side effects, and reduce the chance of addiction. °· If you follow your caregiver's directions about taking opioids and you do not have a history of substance abuse, your risk of becoming addicted is exceptionally small. Opioids are given for short periods of time in careful doses to prevent addiction. °Other methods of pain control include: °· Steroids. °· Physical therapy. °· Heat and cold therapy. °· Compression, such as wrapping an elastic bandage around the area of pain. °· Massage. °These various ways of controlling pain may be used together. Combining different methods of pain control is called multimodal analgesia. Using this approach has many benefits, including being able to eat, move around, and leave the hospital sooner. °Document Released: 02/10/2003 Document Revised: 02/12/2012 Document Reviewed: 02/14/2011 °Exit[employer]® Patient Information ©2015 [employer]. This information is not intended to replace advice given to you by your health care provider. Make sure you discuss any questions you have with your health care provider. ° °

## 2015-03-19 NOTE — MAU Note (Signed)
Had laporoscopic hysterectomy Monday. Called office this afternoon and told no docs in office so would have to come to Triage for prescription. Having small amt of pink d/c but nothing more. Using panty liner for that

## 2015-03-19 NOTE — MAU Provider Note (Signed)
Subjective:  [name] is a 44 y.o. female who presents to MAU for a refill of her pain medication. On 03/15/15 she underwent a LAPAROSCOPIC ASSISTED VAGINAL HYSTERECTOMY and BILATERAL SALPINGECTOMY; on 4/12 she went home. She was given percocet #30 and has run out of them today. She called the office a little before 5:00 pm and was told there was not a physician in the office. She was instructed to come to MAU for a refill. Her pain is improving and she feels she is taking less medication. She is now trying to take only 1 percocet every 6 hours; she does not feel ibuprofen works for her pain. She denies fever.    Objective:  GENERAL: Well-developed, well-nourished female in no acute distress.  LUNGS: Effort normal SKIN: Warm, dry and without erythema PSYCH: Normal mood and affect  Filed Vitals:   03/19/15 2208  BP: 117/82  Pulse: 94  Temp: 98.2 F (36.8 C)  Resp: 18  Height: 5\' 4"  (1.626 m)  Weight: 75.206 kg (165 lb 12.8 oz)   MDM: Discussed patient with Dr. Radene Knee    Assessment:  1. Post-op pain     Plan:  Discharge home in stable condition.  RX: Percocet #30 no refill.  Patient will need to contact Dr. Julien Girt or OB if further pain medication is needed    Lezlie Lye, NP 03/19/2015 10:30 PM

## 2015-05-05 NOTE — Op Note (Signed)
Patty Ruiz, Patty Ruiz NO.:  [id number]  MEDICAL RECORD NO.:  [id number]  LOCATION:  2563                         FACILITY:  [facility]  PHYSICIAN:  Marylynn Pearson, MD    DATE OF BIRTH:  02-12-1971  DATE OF PROCEDURE:  03/15/2015 DATE OF DISCHARGE:  03/16/2015                              OPERATIVE REPORT   PREOPERATIVE DIAGNOSES:  Dysmenorrhea and menorrhagia.  POSTOPERATIVE DIAGNOSES:  Dysmenorrhea and menorrhagia.  PROCEDURE:  Laparoscopic-assisted vaginal hysterectomy with bilateral salpingectomy.  SURGEON:  Marylynn Pearson, MD  COMPLICATIONS:  None.  SPECIMEN:  Uterus, bilateral fallopian tubes.  DESCRIPTION OF PROCEDURE:  The patient was taken to the operating room. After informed consent was obtained, she was given general anesthesia, placed in a dorsal lithotomy position using Allen stirrups.  She was prepped and draped in sterile fashion and a Foley catheter was inserted sterilely.  Bivalve speculum was placed in the vagina and a Hulka clamp was placed through the cervix.  Speculum was removed and our attention was turned to the abdomen.  Marcaine 0.25% was used to provide local anesthesia and an infraumbilical skin incision was made with a scalpel. Optical trocar was inserted under direct visualization.  Once intraperitoneal placement was confirmed, CO2 was turned on and the abdomen and pelvis were insufflated.  A small suprapubic incision was made, and a 5 mm trocar was inserted under direct visualization.  The uterus was retracted towards the patient's left and the right fallopian tube was grasped with atraumatic graspers.  EnSeal device was used to then grasp, cauterize, and cut along the mesosalpinx staying just adjacent to the fallopian tubes.  Incision was continued down the broad ligament to the level of the round ligament.  The round ligament was grasped, cauterized, and cut using the EnSeal.  Hemostasis was assured and the procedure was  repeated on the patient's left side.  Once bilateral round ligaments were cut with the EnSeal, instruments were removed from trocars, and our attention was turned to the vagina.  The Hulka clamp was removed and the cervix was grasped with a toothed tenaculum.  A circumferential incision was made.  The posterior cul-de- sac was entered sharply using curved Mayo scissors and a long weighted speculum was placed in the posterior cul-de-sac.  Bilateral uterosacral ligaments were grasped and cut and suture ligated.  Hemostasis was assured.  Cardinal ligaments and uterine arteries were grasped, cauterized, and cut in similar fashion.  The cervicopubic fascia and bladder were dissected off the anterior cervix and the anterior cul-de- sac was entered sharply.  Deaver was placed, the remaining pedicles were grasped, cut, and suture ligated.  The uterus was then delivered from the vagina.  The posterior vaginal cuff was closed using a running locked suture and then the vaginal cuff was closed using a series of figure-of-eight sutures.  Vaginal hemostasis was assured and our attention was turned to the abdomen.  Laparoscope and CO2 were turned back on.  Inspection of all pedicles and the vaginal cuff were performed and noted to be hemostatic.  All instruments and trocars were then removed from the abdomen.  Incisions were closed with Vicryl and Dermabond was placed over the incision.  She was extubated and taken to the recovery room in stable condition. Sponge, lap, needle, and instrument counts were correct x2.     Marylynn Pearson, MD     GA/MEDQ  D:  05/04/2015  T:  05/05/2015  Job:  [id number]

## 2015-10-25 ENCOUNTER — Emergency Department (HOSPITAL_COMMUNITY): Payer: 59

## 2015-10-25 ENCOUNTER — Encounter (HOSPITAL_COMMUNITY): Payer: Self-pay | Admitting: *Deleted

## 2015-10-25 ENCOUNTER — Observation Stay (HOSPITAL_COMMUNITY)
Admission: EM | Admit: 2015-10-25 | Discharge: 2015-10-27 | Disposition: A | Discharge status: Home / Self Care | Payer: 59 | Attending: Surgery | Admitting: Surgery

## 2015-10-25 DIAGNOSIS — K388 Other specified diseases of appendix: Principal | ICD-10-CM | POA: Insufficient documentation

## 2015-10-25 DIAGNOSIS — Z87820 Personal history of traumatic brain injury: Secondary | ICD-10-CM | POA: Diagnosis not present

## 2015-10-25 DIAGNOSIS — R109 Unspecified abdominal pain: Secondary | ICD-10-CM | POA: Diagnosis present

## 2015-10-25 DIAGNOSIS — Z90710 Acquired absence of both cervix and uterus: Secondary | ICD-10-CM | POA: Insufficient documentation

## 2015-10-25 DIAGNOSIS — Z888 Allergy status to other drugs, medicaments and biological substances status: Secondary | ICD-10-CM | POA: Diagnosis not present

## 2015-10-25 DIAGNOSIS — E039 Hypothyroidism, unspecified: Secondary | ICD-10-CM | POA: Diagnosis not present

## 2015-10-25 DIAGNOSIS — E282 Polycystic ovarian syndrome: Secondary | ICD-10-CM | POA: Insufficient documentation

## 2015-10-25 DIAGNOSIS — M549 Dorsalgia, unspecified: Secondary | ICD-10-CM | POA: Diagnosis not present

## 2015-10-25 DIAGNOSIS — Z91013 Allergy to seafood: Secondary | ICD-10-CM | POA: Insufficient documentation

## 2015-10-25 DIAGNOSIS — E785 Hyperlipidemia, unspecified: Secondary | ICD-10-CM | POA: Insufficient documentation

## 2015-10-25 DIAGNOSIS — Z803 Family history of malignant neoplasm of breast: Secondary | ICD-10-CM | POA: Insufficient documentation

## 2015-10-25 DIAGNOSIS — Z881 Allergy status to other antibiotic agents status: Secondary | ICD-10-CM | POA: Diagnosis not present

## 2015-10-25 DIAGNOSIS — N200 Calculus of kidney: Secondary | ICD-10-CM | POA: Diagnosis not present

## 2015-10-25 DIAGNOSIS — Z885 Allergy status to narcotic agent status: Secondary | ICD-10-CM | POA: Diagnosis not present

## 2015-10-25 DIAGNOSIS — K9041 Non-celiac gluten sensitivity: Secondary | ICD-10-CM | POA: Insufficient documentation

## 2015-10-25 DIAGNOSIS — N838 Other noninflammatory disorders of ovary, fallopian tube and broad ligament: Secondary | ICD-10-CM

## 2015-10-25 DIAGNOSIS — G8929 Other chronic pain: Secondary | ICD-10-CM | POA: Insufficient documentation

## 2015-10-25 DIAGNOSIS — Z6830 Body mass index (BMI) 30.0-30.9, adult: Secondary | ICD-10-CM | POA: Diagnosis not present

## 2015-10-25 LAB — URINALYSIS, ROUTINE W REFLEX MICROSCOPIC
Bilirubin Urine: NEGATIVE
Glucose, UA: NEGATIVE mg/dL
KETONES UR: NEGATIVE mg/dL
LEUKOCYTES UA: NEGATIVE
Nitrite: NEGATIVE
PROTEIN: NEGATIVE mg/dL
Specific Gravity, Urine: 1.017 (ref 1.005–1.030)
pH: 6 (ref 5.0–8.0)

## 2015-10-25 LAB — URINE MICROSCOPIC-ADD ON

## 2015-10-25 LAB — COMPREHENSIVE METABOLIC PANEL
ALK PHOS: 54 U/L (ref 38–126)
ALT: 20 U/L (ref 14–54)
AST: 22 U/L (ref 15–41)
Albumin: 3.7 g/dL (ref 3.5–5.0)
Anion gap: 10 (ref 5–15)
BILIRUBIN TOTAL: 0.4 mg/dL (ref 0.3–1.2)
BUN: 12 mg/dL (ref 6–20)
CALCIUM: 9.1 mg/dL (ref 8.9–10.3)
CO2: 22 mmol/L (ref 22–32)
CREATININE: 0.77 mg/dL (ref 0.44–1.00)
Chloride: 105 mmol/L (ref 101–111)
Glucose, Bld: 104 mg/dL — ABNORMAL HIGH (ref 65–99)
Potassium: 3.8 mmol/L (ref 3.5–5.1)
Sodium: 137 mmol/L (ref 135–145)
Total Protein: 6.8 g/dL (ref 6.5–8.1)

## 2015-10-25 LAB — CBC
HCT: 41.6 % (ref 36.0–46.0)
Hemoglobin: 13.7 g/dL (ref 12.0–15.0)
MCH: 27.9 pg (ref 26.0–34.0)
MCHC: 32.9 g/dL (ref 30.0–36.0)
MCV: 84.7 fL (ref 78.0–100.0)
PLATELETS: 256 10*3/uL (ref 150–400)
RBC: 4.91 MIL/uL (ref 3.87–5.11)
RDW: 13.5 % (ref 11.5–15.5)
WBC: 6.3 10*3/uL (ref 4.0–10.5)

## 2015-10-25 LAB — LIPASE, BLOOD: LIPASE: 36 U/L (ref 11–51)

## 2015-10-25 MED ORDER — DIPHENHYDRAMINE HCL 50 MG/ML IJ SOLN
25.0000 mg | Freq: Four times a day (QID) | INTRAMUSCULAR | Status: DC | PRN
  Administered 2015-10-25 – 2015-10-26 (×3): 25 mg via INTRAVENOUS
  Filled 2015-10-25 (×3): qty 1

## 2015-10-25 MED ORDER — ONDANSETRON HCL 4 MG/2ML IJ SOLN
4.0000 mg | INTRAMUSCULAR | Status: DC | PRN
  Administered 2015-10-25: 4 mg via INTRAVENOUS
  Filled 2015-10-25: qty 2

## 2015-10-25 MED ORDER — FENTANYL CITRATE (PF) 100 MCG/2ML IJ SOLN
50.0000 ug | Freq: Once | INTRAMUSCULAR | Status: AC
  Administered 2015-10-25: 50 ug via INTRAVENOUS

## 2015-10-25 MED ORDER — POTASSIUM CHLORIDE IN NACL 20-0.9 MEQ/L-% IV SOLN
INTRAVENOUS | Status: DC
  Administered 2015-10-25 – 2015-10-26 (×2): via INTRAVENOUS
  Filled 2015-10-25 (×2): qty 1000

## 2015-10-25 MED ORDER — HYDROMORPHONE HCL 1 MG/ML IJ SOLN
1.0000 mg | Freq: Once | INTRAMUSCULAR | Status: AC
  Administered 2015-10-25: 1 mg via INTRAVENOUS
  Filled 2015-10-25: qty 1

## 2015-10-25 MED ORDER — HYDROMORPHONE HCL 1 MG/ML IJ SOLN
0.5000 mg | Freq: Once | INTRAMUSCULAR | Status: AC
  Administered 2015-10-25: 0.5 mg via INTRAVENOUS
  Filled 2015-10-25: qty 1

## 2015-10-25 MED ORDER — DIPHENHYDRAMINE HCL 25 MG PO CAPS
25.0000 mg | ORAL_CAPSULE | Freq: Four times a day (QID) | ORAL | Status: DC | PRN
  Administered 2015-10-27: 25 mg via ORAL
  Filled 2015-10-25: qty 1

## 2015-10-25 MED ORDER — HYDROMORPHONE HCL 1 MG/ML IJ SOLN
0.5000 mg | INTRAMUSCULAR | Status: DC | PRN
  Administered 2015-10-25 – 2015-10-27 (×14): 1 mg via INTRAVENOUS
  Filled 2015-10-25 (×14): qty 1

## 2015-10-25 MED ORDER — FENTANYL CITRATE (PF) 100 MCG/2ML IJ SOLN
50.0000 ug | INTRAMUSCULAR | Status: DC | PRN
Start: 2015-10-25 — End: 2015-10-25
  Administered 2015-10-25: 50 ug via INTRAVENOUS
  Filled 2015-10-25 (×2): qty 2

## 2015-10-25 NOTE — ED Notes (Addendum)
Pt states pain started in right lower back on Friday, radiates to right lower quad.

## 2015-10-25 NOTE — ED Notes (Signed)
Returned from CT scan.

## 2015-10-25 NOTE — H&P (Signed)
Patty Ruiz is an 44 y.o. female.   Chief Complaint: abdominal pain   HPI: Pt presents to the ED with 4 days of abdominal, back, and right sided pain starting in the AM 10/21/15.  Marland Kitchen She had pain but was able to eat and drink initially and went to her chiropractor on 10/22/15.  They did not make her feel better.  She has continued to have pain with abdomen and back, she started having nausea and worsening pain.  She feels like she needs to void all the time. Was concerned she had UTI, and today presented to the ED.    On initial exam the ED doctor thought she had kidney stones.  Her work up in the ED is showing no fever, VSS.  Labs show WBC of 6.3, normal CMP.  UA is also unremarkable. CT scan shows:   Mild prominent size distal appendix with subtle mild stranding of surrounding fat. Measures 9 mm in diameter.  Early tip appendicitis cannot be excluded.  Moderate stool noted in right colon, proximal transverse colon, distal sigmoid and rectum. No colitis. NO SBO. There is left nonobstructive nephrolithiasis. No hydronephrosis or hydroureter. No calcified ureteral calculi.  Uterus is absent, right ovary was normal.  Currently her pain is well controlled with pain meds and exam is pretty benign.   We are ask to evaluate.    Past Medical History  Diagnosis Date  Chronic back pain   PCOD (polycystic ovarian disease)   Hyperlipidemia   Endometriosis   Family history of breast cancer   Hypothyroidism   Gluten intolerance   History of traumatic brain injury    1996-- CLOSED--  NO RESIDUAL OTHER THAN INSOMNIA  PONV (postoperative nausea and vomiting)   Sleep pattern disturbance    TRAUMATIC CLOSED HEAD INJURY RESIDUAL IN 1996--  TAKES TRAZADONE    Past Surgical History  Procedure Laterality Date  . Laparoscopy  x2  last one 2011 [facility])    endometriosis  . Exicision axillary  mass Left 11-15-2012  . Dx laparosocpy for pelvic pain/   repair umbilical hernia with mesh  06-13-2012  .  Anterior cervical decomp/discectomy fusion  2000    C4 -- C5  . Cholecystectomy  2004  ([place], [place])  . Mandible fracture surgery Bilateral 1998    2000-- REVISION RIGHT SIDE   . Removal benign mastoid tumor right ear  2007  . Laparoscopic assisted vaginal hysterectomy N/A 03/15/2015    Procedure: LAPAROSCOPIC ASSISTED VAGINAL HYSTERECTOMY;  Surgeon: Marylynn Pearson, MD;  Location: [place];  Service: Gynecology;  Laterality: N/A;  . Bilateral salpingectomy Bilateral 03/15/2015    Procedure: BILATERAL SALPINGECTOMY;  Surgeon: Marylynn Pearson, MD;  Location: [place];  Service: Gynecology;  Laterality: Bilateral;    Family History  Problem Relation Age of Onset  . Cancer Father 27    melanoma - retinal  . Heart disease Maternal Aunt   . Heart disease Maternal Uncle   . Heart disease Maternal Grandmother   . Heart disease Maternal Grandfather   . Cancer Paternal Aunt 55    breast cancer- reportedly BRCA+ but report not available at present time  . Cancer Paternal Grandfather 99    prostate  . Cancer Other     reportedly has 2nd cousin on maternal side that is also BRCA+ but no report available   Social History:  reports that she has never smoked. She has never used smokeless tobacco. She reports that she does  not drink alcohol or use illicit drugs.  Allergies:  Allergies  Allergen Reactions  . Ciprofloxacin Shortness Of Breath and Swelling  . Codeine Shortness Of Breath and Swelling  . Hydrocodone Shortness Of Breath, Swelling and Other (See Comments)    Severe abdominal cramps, causes what appears to be pancreatitis.  . Morphine And Related Shortness Of Breath, Swelling and Other (See Comments)    Severe abdominal cramps, causes what appears to be pancreatitis.  . Shellfish Allergy Anaphylaxis    Shrimp, lobster, crab  . Other     GLUTEN INTOLERENCE  . Adhesive [Tape] Rash    Looks like poison oak where applied    Prior to  Admission medications   Medication Sig Start Date End Date Taking? Authorizing Provider  acetaminophen (TYLENOL) 500 MG tablet Take 500 mg by mouth every 6 (six) hours as needed for mild pain or moderate pain.   Yes Historical Provider, MD  aspirin-acetaminophen-caffeine (EXCEDRIN MIGRAINE) [phone] MG tablet Take 1 tablet by mouth every 6 (six) hours as needed for headache.   Yes Historical Provider, MD  EPIPEN 2-PAK 0.3 MG/0.3ML SOAJ injection See admin instructions. 08/03/15  Yes Historical Provider, MD  ibuprofen (ADVIL,MOTRIN) 200 MG tablet Take 200 mg by mouth every 6 (six) hours as needed for mild pain, moderate pain or cramping.   Yes Historical Provider, MD  mometasone (NASONEX) 50 MCG/ACT nasal spray Place 2 sprays into the nose daily as needed (congestion).   Yes Historical Provider, MD  Multiple Vitamins-Minerals (MULTIVITAMIN WITH MINERALS) tablet Take 1 tablet by mouth daily.   Yes Historical Provider, MD  TIROSINT 125 MCG CAPS Take 125 mcg by mouth at bedtime. 10/06/15  Yes Historical Provider, MD  traZODone (DESYREL) 100 MG tablet Take 100 mg by mouth at bedtime. 10/05/15  Yes Historical Provider, MD  ibuprofen (ADVIL,MOTRIN) 600 MG tablet Take 1 tablet (600 mg total) by mouth every 6 (six) hours as needed. Patient not taking: Reported on 10/25/2015 03/16/15   Marylynn Pearson, MD  oxyCODONE-acetaminophen (PERCOCET/ROXICET) 5-325 MG per tablet Take 1 tablet by mouth every 4 (four) hours as needed for moderate pain. Patient not taking: Reported on 10/25/2015 03/19/15   Lezlie Lye, NP     Results for orders placed or performed during the hospital encounter of 10/25/15 (from the past 48 hour(s))  Lipase, blood     Status: None   Collection Time: 10/25/15 12:34 PM  Result Value Ref Range   Lipase 36 11 - 51 U/L  Comprehensive metabolic panel     Status: Abnormal   Collection Time: 10/25/15 12:34 PM  Result Value Ref Range   Sodium 137 135 - 145 mmol/L   Potassium 3.8 3.5 - 5.1  mmol/L   Chloride 105 101 - 111 mmol/L   CO2 22 22 - 32 mmol/L   Glucose, Bld 104 (H) 65 - 99 mg/dL   BUN 12 6 - 20 mg/dL   Creatinine, Ser 0.77 0.44 - 1.00 mg/dL   Calcium 9.1 8.9 - 10.3 mg/dL   Total Protein 6.8 6.5 - 8.1 g/dL   Albumin 3.7 3.5 - 5.0 g/dL   AST 22 15 - 41 U/L   ALT 20 14 - 54 U/L   Alkaline Phosphatase 54 38 - 126 U/L   Total Bilirubin 0.4 0.3 - 1.2 mg/dL   GFR calc non Af Amer >60 >60 mL/min   GFR calc Af Amer >60 >60 mL/min    Comment: (NOTE) The eGFR has been calculated using the CKD  EPI equation. This calculation has not been validated in all clinical situations. eGFR's persistently <60 mL/min signify possible Chronic Kidney Disease.    Anion gap 10 5 - 15  CBC     Status: None   Collection Time: 10/25/15 12:34 PM  Result Value Ref Range   WBC 6.3 4.0 - 10.5 K/uL   RBC 4.91 3.87 - 5.11 MIL/uL   Hemoglobin 13.7 12.0 - 15.0 g/dL   HCT 41.6 36.0 - 46.0 %   MCV 84.7 78.0 - 100.0 fL   MCH 27.9 26.0 - 34.0 pg   MCHC 32.9 30.0 - 36.0 g/dL   RDW 13.5 11.5 - 15.5 %   Platelets 256 150 - 400 K/uL  Urinalysis, Routine w reflex microscopic (not at [facility])     Status: Abnormal   Collection Time: 10/25/15 12:36 PM  Result Value Ref Range   Color, Urine YELLOW YELLOW   APPearance CLEAR CLEAR   Specific Gravity, Urine 1.017 1.005 - 1.030   pH 6.0 5.0 - 8.0   Glucose, UA NEGATIVE NEGATIVE mg/dL   Hgb urine dipstick TRACE (A) NEGATIVE   Bilirubin Urine NEGATIVE NEGATIVE   Ketones, ur NEGATIVE NEGATIVE mg/dL   Protein, ur NEGATIVE NEGATIVE mg/dL   Nitrite NEGATIVE NEGATIVE   Leukocytes, UA NEGATIVE NEGATIVE  Urine microscopic-add on     Status: Abnormal   Collection Time: 10/25/15 12:36 PM  Result Value Ref Range   Squamous Epithelial / LPF 6-30 (A) NONE SEEN    Comment: Please note change in reference range.   WBC, UA 0-5 0 - 5 WBC/hpf    Comment: Please note change in reference range.   RBC / HPF 0-5 0 - 5 RBC/hpf    Comment: Please note change in reference  range.   Bacteria, UA FEW (A) NONE SEEN    Comment: Please note change in reference range.   Urine-Other MUCOUS PRESENT    Ct Renal Stone Study  10/25/2015  CLINICAL DATA:  Right flank pain, hysterectomy, frequent urination EXAM: CT ABDOMEN AND PELVIS WITHOUT CONTRAST TECHNIQUE: Multidetector CT imaging of the abdomen and pelvis was performed following the standard protocol without IV contrast. COMPARISON:  05/02/2012 FINDINGS: There is levoscoliosis of upper lumbar spine. Sagittal images of the spine shows disc space flattening with vacuum disc phenomenon at L5-S1 level. Lung bases are unremarkable. The patient is status postcholecystectomy. Unenhanced liver shows no biliary ductal dilatation. Unenhanced pancreas, spleen and adrenal glands are unremarkable. No aortic aneurysm. There is nonobstructive calcified calculus in lower pole of the left kidney measures 6 mm. No hydronephrosis or hydroureter.  No calcified ureteral calculi. No small bowel obstruction. No ascites or free air. No adenopathy. There is mild thickening of distal appendix best seen in axial image 60 and coronal image 51. Subtle mild stranding of periappendiceal fat. The appendix measures 9 mm in diameter. Findings suspicious for early tip appendicitis. Clinical correlation is necessary. The study is limited without IV and oral contrast. On the previous exam the appendix was small in caliber containing air. There is moderate stool in cecum right colon and proximal transverse colon. No colonic obstruction. The uterus is surgically absent. Mild prominent size right ovary measures 4.4 by 3.8 cm. Follicles are noted within right ovary. The left ovary is unremarkable. Moderate stool noted within rectosigmoid colon. Mild redundant sigmoid colon. IMPRESSION: 1. Mild prominent size distal appendix with subtle mild stranding of surrounding fat. Measures 9 mm in diameter please see axial images 60 and 61. Early tip  appendicitis cannot be excluded.  Clinical correlation is necessary. 2. Moderate stool noted in right colon and proximal transverse colon. No colitis. 3. No small bowel obstruction. 4. There is left nonobstructive nephrolithiasis. No hydronephrosis or hydroureter. No calcified ureteral calculi. 5. Surgically absent uterus. 6. Moderate stool noted within distal sigmoid colon and rectum. 7. Nonspecific mild prominent size unenhanced right ovary measures 4.4 x 3.8 cm. These results were called by telephone at the time of interpretation on 10/25/2015 at 3:03 pm to Dr. Leo Grosser , who verbally acknowledged these results. Electronically Signed   By: Lahoma Crocker M.D.   On: 10/25/2015 15:04    Review of Systems  Constitutional: Negative.   Eyes: Negative.   Respiratory: Negative.   Cardiovascular: Negative.   Gastrointestinal: Positive for nausea, abdominal pain, constipation and blood in stool (with constipaition and hemorrhoids.). Negative for vomiting and diarrhea.  Genitourinary: Positive for urgency and frequency.  Musculoskeletal: Positive for back pain (chronic back pain).  Skin: Negative.   Neurological: Positive for headaches (occasional headaches). Negative for dizziness, tingling, tremors, sensory change, speech change, focal weakness, seizures and loss of consciousness.  Endo/Heme/Allergies: Negative.   Psychiatric/Behavioral: The patient is nervous/anxious.     Blood pressure 121/83, pulse 86, temperature 98.1 F (36.7 C), temperature source Oral, resp. rate 20, height 5' 4.5" (1.638 m), weight 81.194 kg (179 lb), last menstrual period 02/18/2015, SpO2 96 %. Physical Exam  Constitutional: She is oriented to person, place, and time. She appears well-developed and well-nourished. No distress.  HENT:  Head: Normocephalic.  Nose: Nose normal.  Eyes: Conjunctivae and EOM are normal. Right eye exhibits no discharge. Left eye exhibits no discharge. No scleral icterus.  Neck: Neck supple. No JVD present. No tracheal deviation  present. No thyromegaly present.  Cardiovascular: Normal rate, regular rhythm, normal heart sounds and intact distal pulses.   No murmur heard. Respiratory: Effort normal and breath sounds normal. No respiratory distress. She has no wheezes. She has no rales. She exhibits no tenderness.  GI: Soft. Bowel sounds are normal. She exhibits no distension. There is tenderness (Tender mostly RLQ, ). There is no rebound and no guarding.  She currently is not having much pain.  Musculoskeletal: She exhibits no edema or tenderness.  Lymphadenopathy:    She has no cervical adenopathy.  Neurological: She is alert and oriented to person, place, and time. No cranial nerve deficit.  Skin: Skin is warm and dry. No rash noted. She is not diaphoretic. No erythema. No pallor.  Psychiatric: She has a normal mood and affect. Her behavior is normal. Judgment and thought content normal.     Assessment/Plan Abdominal pain with probable early appendicitis  Hx of chronic back pain Hx of endometriosis Recent hysterectomy PCOD Hypothyroid Hx of TBI Gluten intolerant    Plan:  NPO, admit for observation, I will have Dr. Rosendo Gros see and evaluate.  Recheck labs and exam in AM.   Trampas Stettner 10/25/2015, 3:32 PM

## 2015-10-25 NOTE — ED Provider Notes (Signed)
CSN: [id number]     Arrival date & time 10/25/15  1159 History   First MD Initiated Contact with Patient 10/25/15 1248     Chief Complaint  Patient presents with  . Back Pain  . Abdominal Pain     (Consider location/radiation/quality/duration/timing/severity/associated sxs/prior Treatment) Patient is a 44 y.o. female presenting with abdominal pain. The history is provided by the nursing home.  Abdominal Pain Pain location:  RLQ Pain quality: aching, sharp and stabbing   Pain radiates to:  Does not radiate Pain severity:  Moderate Onset quality:  Sudden Duration:  1 day Timing:  Constant Progression:  Unchanged Chronicity:  New Context: previous surgery   Relieved by:  Nothing Worsened by:  Nothing tried Ineffective treatments:  None tried Associated symptoms: anorexia   Associated symptoms: no constipation, no hematochezia and no vomiting     Past Medical History  Diagnosis Date  . Hyperlipidemia   . Dysmenorrhea   . Menorrhagia   . Endometriosis   . Family history of breast cancer   . PCOD (polycystic ovarian disease)   . Hypothyroidism   . Gluten intolerance   . History of traumatic brain injury     1996-- CLOSED--  NO RESIDUAL OTHER THAN INSOMNIA  . PONV (postoperative nausea and vomiting)   . Sleep pattern disturbance     TRAUMATIC CLOSED HEAD INJURY RESIDUAL IN 1996--  TAKES TRAZADONE   Past Surgical History  Procedure Laterality Date  . Laparoscopy  x2  last one 2011 [facility])    endometriosis  . Exicision axillary  mass Left 11-15-2012  . Dx laparosocpy for pelvic pain/   repair umbilical hernia with mesh  06-13-2012  . Anterior cervical decomp/discectomy fusion  2000    C4 -- C5  . Cholecystectomy  2004  ([place], [place])  . Mandible fracture surgery Bilateral 1998    2000-- REVISION RIGHT SIDE   . Removal benign mastoid tumor right ear  2007  . Laparoscopic assisted vaginal hysterectomy N/A 03/15/2015    Procedure: LAPAROSCOPIC ASSISTED VAGINAL  HYSTERECTOMY;  Surgeon: Marylynn Pearson, MD;  Location: [place];  Service: Gynecology;  Laterality: N/A;  . Bilateral salpingectomy Bilateral 03/15/2015    Procedure: BILATERAL SALPINGECTOMY;  Surgeon: Marylynn Pearson, MD;  Location: [place];  Service: Gynecology;  Laterality: Bilateral;   Family History  Problem Relation Age of Onset  . Cancer Father 20    melanoma - retinal  . Heart disease Maternal Aunt   . Heart disease Maternal Uncle   . Heart disease Maternal Grandmother   . Heart disease Maternal Grandfather   . Cancer Paternal Aunt 66    breast cancer- reportedly BRCA+ but report not available at present time  . Cancer Paternal Grandfather 58    prostate  . Cancer Other     reportedly has 2nd cousin on maternal side that is also BRCA+ but no report available   Social History  Substance Use Topics  . Smoking status: Never Smoker   . Smokeless tobacco: Never Used  . Alcohol Use: No   OB History    No data available     Review of Systems  Gastrointestinal: Positive for abdominal pain and anorexia. Negative for vomiting, constipation and hematochezia.  All other systems reviewed and are negative.     Allergies  Ciprofloxacin; Codeine; Hydrocodone; Morphine and related; Shellfish allergy; Other; and Adhesive  Home Medications   Prior to Admission medications   Medication Sig Start Date End Date  Taking? Authorizing Provider  ibuprofen (ADVIL,MOTRIN) 600 MG tablet Take 1 tablet (600 mg total) by mouth every 6 (six) hours as needed. 03/16/15   Marylynn Pearson, MD  Levothyroxine Sodium 112 MCG CAPS Take 112 mcg by mouth daily before breakfast. Name Brand--  [place]on    Historical Provider, MD  Multiple Vitamins-Minerals (MULTIVITAMIN WITH MINERALS) tablet Take 1 tablet by mouth daily.    Historical Provider, MD  Omega-3 Fatty Acids (FISH OIL PO) Take by mouth daily. LIQUID--  ONE TEASPOON    Historical Provider, MD   oxyCODONE-acetaminophen (PERCOCET/ROXICET) 5-325 MG per tablet Take 1 tablet by mouth every 4 (four) hours as needed for moderate pain. 03/19/15   Lezlie Lye, NP  sulfamethoxazole-trimethoprim (BACTRIM DS,SEPTRA DS) 800-160 MG per tablet Take 1 tablet by mouth 2 (two) times daily.    Historical Provider, MD   BP 145/91 mmHg  Pulse 76  Temp(Src) 97.3 F (36.3 C) (Oral)  Resp 20  Ht 5' 4.5" (1.638 m)  Wt 179 lb (81.194 kg)  BMI 30.26 kg/m2  SpO2 98%  LMP 02/18/2015 (Exact Date) Physical Exam  Constitutional: She is oriented to person, place, and time. She appears well-developed and well-nourished. No distress.  HENT:  Head: Normocephalic.  Eyes: Conjunctivae are normal.  Neck: Neck supple. No tracheal deviation present.  Cardiovascular: Normal rate and regular rhythm.   Pulmonary/Chest: Effort normal. No respiratory distress.  Abdominal: Soft. She exhibits no distension. There is tenderness (with mild associated flank tenderness) in the right lower quadrant. There is guarding. There is no rigidity.  Neurological: She is alert and oriented to person, place, and time.  Skin: Skin is warm and dry.  Psychiatric: She has a normal mood and affect.    ED Course  Procedures (including critical care time)  Emergency Focused Ultrasound Exam Limited Retroperitoneal Ultrasound of Kidneys  Performed and interpreted by Dr. Laneta Simmers Focused abdominal ultrasound with both kidneys imaged in transverse and longitudinal planes in real-time. Indication: flank pain Findings: bilateral kidneys present, no shadowing, right side scattered anechoic areas Interpretation: possible right hydronephrosis vs cyst visualized.  no stones visualized  Images archived electronically  CPT Code: [place] - Abnormal; Notable for the following:    Glucose, Bld 104 (*)    All other components within normal limits  URINALYSIS, ROUTINE W REFLEX  MICROSCOPIC (NOT AT [facility]) - Abnormal; Notable for the following:    Hgb urine dipstick TRACE (*)    All other components within normal limits  URINE MICROSCOPIC-ADD ON - Abnormal; Notable for the following:    Squamous Epithelial / LPF 6-30 (*)    Bacteria, UA FEW (*)    All other components within normal limits  URINE CULTURE  LIPASE, BLOOD  CBC    Imaging Review Ct Renal Stone Study  10/25/2015  CLINICAL DATA:  Right flank pain, hysterectomy, frequent urination EXAM: CT ABDOMEN AND PELVIS WITHOUT CONTRAST TECHNIQUE: Multidetector CT imaging of the abdomen and pelvis was performed following the standard protocol without IV contrast. COMPARISON:  05/02/2012 FINDINGS: There is levoscoliosis of upper lumbar spine. Sagittal images of the spine shows disc space flattening with vacuum disc phenomenon at L5-S1 level. Lung bases are unremarkable. The patient is status postcholecystectomy. Unenhanced liver shows no biliary ductal dilatation. Unenhanced pancreas, spleen and adrenal glands are unremarkable. No aortic aneurysm. There is nonobstructive calcified calculus in lower pole of the left kidney measures 6 mm. No hydronephrosis or hydroureter.  No calcified  ureteral calculi. No small bowel obstruction. No ascites or free air. No adenopathy. There is mild thickening of distal appendix best seen in axial image 60 and coronal image 51. Subtle mild stranding of periappendiceal fat. The appendix measures 9 mm in diameter. Findings suspicious for early tip appendicitis. Clinical correlation is necessary. The study is limited without IV and oral contrast. On the previous exam the appendix was small in caliber containing air. There is moderate stool in cecum right colon and proximal transverse colon. No colonic obstruction. The uterus is surgically absent. Mild prominent size right ovary measures 4.4 by 3.8 cm. Follicles are noted within right ovary. The left ovary is unremarkable. Moderate stool noted within  rectosigmoid colon. Mild redundant sigmoid colon. IMPRESSION: 1. Mild prominent size distal appendix with subtle mild stranding of surrounding fat. Measures 9 mm in diameter please see axial images 60 and 61. Early tip appendicitis cannot be excluded. Clinical correlation is necessary. 2. Moderate stool noted in right colon and proximal transverse colon. No colitis. 3. No small bowel obstruction. 4. There is left nonobstructive nephrolithiasis. No hydronephrosis or hydroureter. No calcified ureteral calculi. 5. Surgically absent uterus. 6. Moderate stool noted within distal sigmoid colon and rectum. 7. Nonspecific mild prominent size unenhanced right ovary measures 4.4 x 3.8 cm. These results were called by telephone at the time of interpretation on 10/25/2015 at 3:03 pm to Dr. Leo Grosser , who verbally acknowledged these results. Electronically Signed   By: Lahoma Crocker M.D.   On: 10/25/2015 15:04   I have personally reviewed and evaluated these images and lab results as part of my medical decision-making.   EKG Interpretation None      MDM   Final diagnoses:  Right flank pain    44 year old female with history of previous kidney stone remotely presents with right lower back pain over the last 4 days and sudden onset right lower quadrant abdominal pain with sensation of urgency. Symptoms concerning for recurrent stone given history and exam. Bedside US with possible hydro, small Hb on urine. CT without stone on right or hydro, likely perinephric fat obscuring bedside exam. Possible tip appendicitis with fluid filled appendix and borderline enlargement on CT. Other complicating features of stool burden and non-specific enlarged ovary. Given multiple rounds of parenteral pain meds with moderate relief of symptoms. Surgery consulted for further recommendations regarding management without definitive findings and possible early appendicitis. Surgery to admit for observation and serial exams.      Leo Grosser, MD 10/25/15 2147

## 2015-10-25 NOTE — ED Notes (Signed)
Pt reports lower back pain x 4 days, reports onset 1 hour ago of pain being right and abd. Has abd pressure and feeling that she constantly has to urinate. Denies fever.

## 2015-10-26 ENCOUNTER — Observation Stay (HOSPITAL_COMMUNITY): Payer: 59 | Admitting: Certified Registered Nurse Anesthetist

## 2015-10-26 ENCOUNTER — Encounter (HOSPITAL_COMMUNITY)
Admission: EM | Disposition: A | Discharge status: Home / Self Care | Payer: Self-pay | Source: Home / Self Care | Attending: Emergency Medicine

## 2015-10-26 DIAGNOSIS — Z9049 Acquired absence of other specified parts of digestive tract: Secondary | ICD-10-CM | POA: Insufficient documentation

## 2015-10-26 HISTORY — PX: LAPAROSCOPIC APPENDECTOMY: SHX408

## 2015-10-26 HISTORY — PX: LAPAROSCOPY: SHX197

## 2015-10-26 LAB — BASIC METABOLIC PANEL
ANION GAP: 8 (ref 5–15)
BUN: 7 mg/dL (ref 6–20)
CHLORIDE: 107 mmol/L (ref 101–111)
CO2: 23 mmol/L (ref 22–32)
CREATININE: 0.74 mg/dL (ref 0.44–1.00)
Calcium: 8.6 mg/dL — ABNORMAL LOW (ref 8.9–10.3)
GFR calc non Af Amer: >60 mL/min (ref >60)
Glucose, Bld: 94 mg/dL (ref 65–99)
Potassium: 4.1 mmol/L (ref 3.5–5.1)
SODIUM: 138 mmol/L (ref 135–145)

## 2015-10-26 LAB — SURGICAL PCR SCREEN
MRSA, PCR: NEGATIVE
STAPHYLOCOCCUS AUREUS: NEGATIVE

## 2015-10-26 LAB — CBC
HEMATOCRIT: 39.1 % (ref 36.0–46.0)
HEMOGLOBIN: 12.8 g/dL (ref 12.0–15.0)
MCH: 28 pg (ref 26.0–34.0)
MCHC: 32.7 g/dL (ref 30.0–36.0)
MCV: 85.6 fL (ref 78.0–100.0)
Platelets: 239 10*3/uL (ref 150–400)
RBC: 4.57 MIL/uL (ref 3.87–5.11)
RDW: 13.5 % (ref 11.5–15.5)
WBC: 7.5 10*3/uL (ref 4.0–10.5)

## 2015-10-26 SURGERY — LAPAROSCOPY, DIAGNOSTIC
Anesthesia: General | Site: Abdomen

## 2015-10-26 MED ORDER — LIDOCAINE HCL (CARDIAC) 20 MG/ML IV SOLN
INTRAVENOUS | Status: AC
  Filled 2015-10-26: qty 5

## 2015-10-26 MED ORDER — MAGNESIUM HYDROXIDE 400 MG/5ML PO SUSP
30.0000 mL | Freq: Once | ORAL | Status: AC
  Administered 2015-10-26: 30 mL via ORAL
  Filled 2015-10-26: qty 30

## 2015-10-26 MED ORDER — HYDROMORPHONE HCL 1 MG/ML IJ SOLN: 0.2500 mg | INTRAMUSCULAR | Status: DC | PRN

## 2015-10-26 MED ORDER — KETOROLAC TROMETHAMINE 30 MG/ML IJ SOLN: 30.0000 mg | Freq: Four times a day (QID) | INTRAMUSCULAR | Status: DC | PRN

## 2015-10-26 MED ORDER — BUPIVACAINE-EPINEPHRINE (PF) 0.25% -1:200000 IJ SOLN
INTRAMUSCULAR | Status: AC
  Filled 2015-10-26: qty 30

## 2015-10-26 MED ORDER — FENTANYL CITRATE (PF) 250 MCG/5ML IJ SOLN
INTRAMUSCULAR | Status: DC | PRN
  Administered 2015-10-26: 150 ug via INTRAVENOUS

## 2015-10-26 MED ORDER — DEXAMETHASONE SODIUM PHOSPHATE 4 MG/ML IJ SOLN
INTRAMUSCULAR | Status: AC
  Filled 2015-10-26: qty 1

## 2015-10-26 MED ORDER — MIDAZOLAM HCL 5 MG/5ML IJ SOLN
INTRAMUSCULAR | Status: DC | PRN
  Administered 2015-10-26: 2 mg via INTRAVENOUS

## 2015-10-26 MED ORDER — LIDOCAINE HCL (CARDIAC) 20 MG/ML IV SOLN
INTRAVENOUS | Status: DC | PRN
  Administered 2015-10-26: 40 mg via INTRAVENOUS

## 2015-10-26 MED ORDER — PHENYLEPHRINE HCL 10 MG/ML IJ SOLN
INTRAMUSCULAR | Status: DC | PRN
  Administered 2015-10-26 (×2): 80 ug via INTRAVENOUS

## 2015-10-26 MED ORDER — GLYCOPYRROLATE 0.2 MG/ML IJ SOLN
INTRAMUSCULAR | Status: AC
  Filled 2015-10-26: qty 2

## 2015-10-26 MED ORDER — PROMETHAZINE HCL 25 MG/ML IJ SOLN
6.2500 mg | INTRAMUSCULAR | Status: DC | PRN
Start: 2015-10-26 — End: 2015-10-26

## 2015-10-26 MED ORDER — DEXAMETHASONE SODIUM PHOSPHATE 4 MG/ML IJ SOLN
INTRAMUSCULAR | Status: DC | PRN
  Administered 2015-10-26: 4 mg via INTRAVENOUS

## 2015-10-26 MED ORDER — ROCURONIUM BROMIDE 50 MG/5ML IV SOLN
INTRAVENOUS | Status: AC
  Filled 2015-10-26: qty 1

## 2015-10-26 MED ORDER — SUCCINYLCHOLINE CHLORIDE 20 MG/ML IJ SOLN
INTRAMUSCULAR | Status: DC | PRN
  Administered 2015-10-26: 100 mg via INTRAVENOUS

## 2015-10-26 MED ORDER — 0.9 % SODIUM CHLORIDE (POUR BTL) OPTIME
TOPICAL | Status: DC | PRN
  Administered 2015-10-26: 1000 mL

## 2015-10-26 MED ORDER — GLYCOPYRROLATE 0.2 MG/ML IJ SOLN
INTRAMUSCULAR | Status: DC | PRN
  Administered 2015-10-26: 0.4 mg via INTRAVENOUS

## 2015-10-26 MED ORDER — MIDAZOLAM HCL 2 MG/2ML IJ SOLN
INTRAMUSCULAR | Status: AC
  Filled 2015-10-26: qty 2

## 2015-10-26 MED ORDER — PHENYLEPHRINE 40 MCG/ML (10ML) SYRINGE FOR IV PUSH (FOR BLOOD PRESSURE SUPPORT)
PREFILLED_SYRINGE | INTRAVENOUS | Status: AC
  Filled 2015-10-26: qty 10

## 2015-10-26 MED ORDER — LACTATED RINGERS IV SOLN
INTRAVENOUS | Status: DC
Start: 2015-10-26 — End: 2015-10-27
  Administered 2015-10-26 – 2015-10-27 (×3): via INTRAVENOUS

## 2015-10-26 MED ORDER — ONDANSETRON HCL 4 MG/2ML IJ SOLN
INTRAMUSCULAR | Status: DC | PRN
  Administered 2015-10-26: 4 mg via INTRAVENOUS

## 2015-10-26 MED ORDER — EPINEPHRINE 0.3 MG/0.3ML IJ SOAJ
0.3000 mg | INTRAMUSCULAR | Status: DC | PRN
  Filled 2015-10-26: qty 0.3

## 2015-10-26 MED ORDER — ONDANSETRON HCL 4 MG/2ML IJ SOLN
4.0000 mg | INTRAMUSCULAR | Status: DC | PRN
  Administered 2015-10-26 (×2): 4 mg via INTRAVENOUS
  Filled 2015-10-26 (×3): qty 2

## 2015-10-26 MED ORDER — FENTANYL CITRATE (PF) 250 MCG/5ML IJ SOLN
INTRAMUSCULAR | Status: AC
  Filled 2015-10-26: qty 5

## 2015-10-26 MED ORDER — ROCURONIUM BROMIDE 100 MG/10ML IV SOLN
INTRAVENOUS | Status: DC | PRN
  Administered 2015-10-26: 10 mg via INTRAVENOUS

## 2015-10-26 MED ORDER — PROPOFOL 10 MG/ML IV BOLUS
INTRAVENOUS | Status: DC | PRN
  Administered 2015-10-26: 120 mg via INTRAVENOUS

## 2015-10-26 MED ORDER — SCOPOLAMINE 1 MG/3DAYS TD PT72
MEDICATED_PATCH | TRANSDERMAL | Status: AC
  Administered 2015-10-26: 1.5 mg via TRANSDERMAL
  Filled 2015-10-26: qty 1

## 2015-10-26 MED ORDER — SODIUM CHLORIDE 0.9 % IR SOLN
Status: DC | PRN
  Administered 2015-10-26: 1000 mL

## 2015-10-26 MED ORDER — MEPERIDINE HCL 25 MG/ML IJ SOLN: 6.2500 mg | INTRAMUSCULAR | Status: DC | PRN

## 2015-10-26 MED ORDER — BUPIVACAINE-EPINEPHRINE 0.25% -1:200000 IJ SOLN
INTRAMUSCULAR | Status: DC | PRN
  Administered 2015-10-26: 6 mL

## 2015-10-26 MED ORDER — SCOPOLAMINE 1 MG/3DAYS TD PT72
1.0000 | MEDICATED_PATCH | Freq: Once | TRANSDERMAL | Status: DC
  Administered 2015-10-26: 1.5 mg via TRANSDERMAL

## 2015-10-26 MED ORDER — DEXTROSE 5 % IV SOLN
2.0000 g | INTRAVENOUS | Status: DC
  Administered 2015-10-26: 2 g via INTRAVENOUS
  Filled 2015-10-26 (×2): qty 2

## 2015-10-26 MED ORDER — ASPIRIN-ACETAMINOPHEN-CAFFEINE 250-250-65 MG PO TABS
1.0000 | ORAL_TABLET | Freq: Four times a day (QID) | ORAL | Status: DC | PRN
  Filled 2015-10-26: qty 1

## 2015-10-26 MED ORDER — TRAZODONE HCL 50 MG PO TABS
50.0000 mg | ORAL_TABLET | Freq: Every day | ORAL | Status: DC
  Administered 2015-10-26: 50 mg via ORAL
  Filled 2015-10-26: qty 1

## 2015-10-26 MED ORDER — METRONIDAZOLE IN NACL 5-0.79 MG/ML-% IV SOLN
500.0000 mg | Freq: Three times a day (TID) | INTRAVENOUS | Status: DC
  Administered 2015-10-26 – 2015-10-27 (×3): 500 mg via INTRAVENOUS
  Filled 2015-10-26 (×4): qty 100

## 2015-10-26 MED ORDER — NEOSTIGMINE METHYLSULFATE 10 MG/10ML IV SOLN
INTRAVENOUS | Status: DC | PRN
  Administered 2015-10-26: 3 mg via INTRAVENOUS

## 2015-10-26 MED ORDER — FENTANYL CITRATE (PF) 100 MCG/2ML IJ SOLN: 50.0000 ug | INTRAMUSCULAR | Status: DC | PRN

## 2015-10-26 MED ORDER — ONDANSETRON HCL 4 MG/2ML IJ SOLN
INTRAMUSCULAR | Status: AC
  Filled 2015-10-26: qty 2

## 2015-10-26 MED ORDER — ACETAMINOPHEN 500 MG PO TABS: 500.0000 mg | ORAL_TABLET | Freq: Four times a day (QID) | ORAL | Status: DC | PRN

## 2015-10-26 MED ORDER — MIDAZOLAM HCL 2 MG/2ML IJ SOLN: 0.5000 mg | Freq: Once | INTRAMUSCULAR | Status: DC | PRN

## 2015-10-26 SURGICAL SUPPLY — 45 items
APPLIER CLIP ROT 10 11.4 M/L (STAPLE)
BLADE SURG ROTATE 9660 (MISCELLANEOUS) IMPLANT
CANISTER SUCTION 2500CC (MISCELLANEOUS) ×2 IMPLANT
CHLORAPREP W/TINT 26ML (MISCELLANEOUS) ×2 IMPLANT
CLIP APPLIE ROT 10 11.4 M/L (STAPLE) IMPLANT
COVER SURGICAL LIGHT HANDLE (MISCELLANEOUS) ×2 IMPLANT
CUTTER FLEX LINEAR 45M (STAPLE) ×2 IMPLANT
DECANTER SPIKE VIAL GLASS SM (MISCELLANEOUS) ×2 IMPLANT
DRAPE LAPAROSCOPIC ABDOMINAL (DRAPES) ×2 IMPLANT
DRAPE WARM FLUID 44X44 (DRAPE) ×2 IMPLANT
ELECT REM PT RETURN 9FT ADLT (ELECTROSURGICAL) ×2
ELECTRODE REM PT RTRN 9FT ADLT (ELECTROSURGICAL) ×1 IMPLANT
ENDOLOOP SUT PDS II  0 18 (SUTURE)
ENDOLOOP SUT PDS II 0 18 (SUTURE) IMPLANT
GLOVE BIO SURGEON STRL SZ8 (GLOVE) ×2 IMPLANT
GLOVE BIOGEL PI IND STRL 8 (GLOVE) ×1 IMPLANT
GLOVE BIOGEL PI INDICATOR 8 (GLOVE) ×1
GOWN STRL REUS W/ TWL LRG LVL3 (GOWN DISPOSABLE) ×3 IMPLANT
GOWN STRL REUS W/ TWL XL LVL3 (GOWN DISPOSABLE) ×1 IMPLANT
GOWN STRL REUS W/TWL LRG LVL3 (GOWN DISPOSABLE) ×3
GOWN STRL REUS W/TWL XL LVL3 (GOWN DISPOSABLE) ×1
KIT BASIN OR (CUSTOM PROCEDURE TRAY) ×2 IMPLANT
KIT ROOM TURNOVER OR (KITS) ×4 IMPLANT
LIQUID BAND (GAUZE/BANDAGES/DRESSINGS) ×2 IMPLANT
NS IRRIG 1000ML POUR BTL (IV SOLUTION) ×4 IMPLANT
PAD ARMBOARD 7.5X6 YLW CONV (MISCELLANEOUS) ×4 IMPLANT
POUCH SPECIMEN RETRIEVAL 10MM (ENDOMECHANICALS) ×2 IMPLANT
RELOAD STAPLE TA45 3.5 REG BLU (ENDOMECHANICALS) ×2 IMPLANT
SCALPEL HARMONIC ACE (MISCELLANEOUS) ×2 IMPLANT
SCISSORS LAP 5X35 DISP (ENDOMECHANICALS) IMPLANT
SET IRRIG TUBING LAPAROSCOPIC (IRRIGATION / IRRIGATOR) ×2 IMPLANT
SLEEVE ENDOPATH XCEL 5M (ENDOMECHANICALS) ×2 IMPLANT
SPECIMEN JAR SMALL (MISCELLANEOUS) ×2 IMPLANT
SUT MNCRL AB 4-0 PS2 18 (SUTURE) ×2 IMPLANT
SUT MON AB 4-0 PC3 18 (SUTURE) ×2 IMPLANT
TOWEL OR 17X24 6PK STRL BLUE (TOWEL DISPOSABLE) ×4 IMPLANT
TOWEL OR 17X26 10 PK STRL BLUE (TOWEL DISPOSABLE) ×2 IMPLANT
TRAY FOLEY CATH 16FR SILVER (SET/KITS/TRAYS/PACK) ×2 IMPLANT
TRAY LAPAROSCOPIC MC (CUSTOM PROCEDURE TRAY) ×2 IMPLANT
TROCAR XCEL BLADELESS 5X75MML (TROCAR) ×4 IMPLANT
TROCAR XCEL BLUNT TIP 100MML (ENDOMECHANICALS) ×2 IMPLANT
TROCAR XCEL NON-BLD 11X100MML (ENDOMECHANICALS) IMPLANT
TROCAR XCEL NON-BLD 5MMX100MML (ENDOMECHANICALS) ×2 IMPLANT
TUBING INSUFFLATION (TUBING) ×2 IMPLANT
WATER STERILE IRR 1000ML POUR (IV SOLUTION) IMPLANT

## 2015-10-26 NOTE — Anesthesia Postprocedure Evaluation (Signed)
Anesthesia Post Note  Patient: Patty Ruiz  Procedure(s) Performed: Procedure(s) (LRB): LAPAROSCOPY DIAGNOSTIC (N/A) APPENDECTOMY LAPAROSCOPIC (N/A)  Patient location during evaluation: PACU Anesthesia Type: General Level of consciousness: awake and alert Pain management: pain level controlled Vital Signs Assessment: post-procedure vital signs reviewed and stable Respiratory status: spontaneous breathing, nonlabored ventilation and respiratory function stable Cardiovascular status: blood pressure returned to baseline and stable Postop Assessment: No signs of nausea or vomiting Anesthetic complications: no    Last Vitals:  Filed Vitals:   10/26/15 1439 10/26/15 1451  BP: 130/85   Pulse:  60  Temp: 36.4 C   Resp:  15    Last Pain:  Filed Vitals:   10/26/15 1452  PainSc: Asleep                 Earlie Schank,E. Kalyn Hofstra

## 2015-10-26 NOTE — Progress Notes (Signed)
Report called to Short stay  

## 2015-10-26 NOTE — Progress Notes (Signed)
Subjective: Still having pain when pain meds wear off, pain on exam now in RLQ on any palpation.  Objective: Vital signs in last 24 hours: Temp:  [97.3 F (36.3 C)-99.2 F (37.3 C)] 99.1 F (37.3 C) (11/22 0522) Pulse Rate:  [69-92] 76 (11/22 0522) Resp:  [18-20] 18 (11/22 0522) BP: (112-145)/(76-92) 113/78 mmHg (11/22 0522) SpO2:  [95 %-99 %] 95 % (11/22 0522) Weight:  [81.194 kg (179 lb)] 81.194 kg (179 lb) (11/21 1805) Last BM Date: 10/25/15 1150 urine NPO TM 99.1, VSS WBC 7.5, BMP normal Intake/Output from previous day: 11/21 0701 - 11/22 0700 In: 1928.3 [I.V.:1928.3] Out: 1150 [Urine:1150] Intake/Output this shift:    General appearance: alert, cooperative and still having pain and discomfort worse with palpation. GI: soft, + BS, still having RLQ pain.  Lab Results:   Recent Labs  10/25/15 1234 10/26/15 0325  WBC 6.3 7.5  HGB 13.7 12.8  HCT 41.6 39.1  PLT 256 239    BMET  Recent Labs  10/25/15 1234 10/26/15 0325  NA 137 138  K 3.8 4.1  CL 105 107  CO2 22 23  GLUCOSE 104* 94  BUN 12 7  CREATININE 0.77 0.74  CALCIUM 9.1 8.6*   PT/INR No results for input(s): LABPROT, INR in the last 72 hours.   Recent Labs Lab 10/25/15 1234  AST 22  ALT 20  ALKPHOS 54  BILITOT 0.4  PROT 6.8  ALBUMIN 3.7     Lipase     Component Value Date/Time   LIPASE 36 10/25/2015 1234     Studies/Results: Ct Renal Stone Study  10/25/2015  CLINICAL DATA:  Right flank pain, hysterectomy, frequent urination EXAM: CT ABDOMEN AND PELVIS WITHOUT CONTRAST TECHNIQUE: Multidetector CT imaging of the abdomen and pelvis was performed following the standard protocol without IV contrast. COMPARISON:  05/02/2012 FINDINGS: There is levoscoliosis of upper lumbar spine. Sagittal images of the spine shows disc space flattening with vacuum disc phenomenon at L5-S1 level. Lung bases are unremarkable. The patient is status postcholecystectomy. Unenhanced liver shows no biliary  ductal dilatation. Unenhanced pancreas, spleen and adrenal glands are unremarkable. No aortic aneurysm. There is nonobstructive calcified calculus in lower pole of the left kidney measures 6 mm. No hydronephrosis or hydroureter.  No calcified ureteral calculi. No small bowel obstruction. No ascites or free air. No adenopathy. There is mild thickening of distal appendix best seen in axial image 60 and coronal image 51. Subtle mild stranding of periappendiceal fat. The appendix measures 9 mm in diameter. Findings suspicious for early tip appendicitis. Clinical correlation is necessary. The study is limited without IV and oral contrast. On the previous exam the appendix was small in caliber containing air. There is moderate stool in cecum right colon and proximal transverse colon. No colonic obstruction. The uterus is surgically absent. Mild prominent size right ovary measures 4.4 by 3.8 cm. Follicles are noted within right ovary. The left ovary is unremarkable. Moderate stool noted within rectosigmoid colon. Mild redundant sigmoid colon. IMPRESSION: 1. Mild prominent size distal appendix with subtle mild stranding of surrounding fat. Measures 9 mm in diameter please see axial images 60 and 61. Early tip appendicitis cannot be excluded. Clinical correlation is necessary. 2. Moderate stool noted in right colon and proximal transverse colon. No colitis. 3. No small bowel obstruction. 4. There is left nonobstructive nephrolithiasis. No hydronephrosis or hydroureter. No calcified ureteral calculi. 5. Surgically absent uterus. 6. Moderate stool noted within distal sigmoid colon and rectum. 7. Nonspecific mild prominent  size unenhanced right ovary measures 4.4 x 3.8 cm. These results were called by telephone at the time of interpretation on 10/25/2015 at 3:03 pm to Dr. Leo Grosser , who verbally acknowledged these results. Electronically Signed   By: Lahoma Crocker M.D.   On: 10/25/2015 15:04    Medications:     Assessment/Plan Abdominal pain with probable early appendicitis  Hx of chronic back pain Hx of endometriosis Recent hysterectomy PCOD Hypothyroid Hx of TBI Gluten intolerant  Antibiotics:  None DVT:  SCD   Plan:  Discussed with Dr. Brantley Stage and we will set her up for laparoscopy and possible appendectomy for later today.  I will also start her on antibiotics.         Tanika Bracco 10/26/2015

## 2015-10-26 NOTE — Anesthesia Preprocedure Evaluation (Addendum)
Anesthesia Evaluation  Patient identified by MRN, date of birth, ID band Patient awake    Reviewed: Allergy & Precautions, NPO status , Patient's Chart, lab work & pertinent test results  History of Anesthesia Complications Negative for: history of anesthetic complications  Airway Mallampati: II  TM Distance: >3 FB Neck ROM: Full    Dental  (+) Caps, Dental Advisory Given   Pulmonary neg pulmonary ROS,    breath sounds clear to auscultation       Cardiovascular (-) anginanegative cardio ROS   Rhythm:Regular     Neuro/Psych negative neurological ROS     GI/Hepatic Neg liver ROS, Nausea with acute appy   Endo/Other  Hypothyroidism Morbid obesityPolycystic ovarian  Renal/GU negative Renal ROS     Musculoskeletal negative musculoskeletal ROS (+)   Abdominal (+) + obese,   Peds  Hematology negative hematology ROS (+)   Anesthesia Other Findings   Reproductive/Obstetrics S/p hysterectomy                            Anesthesia Physical Anesthesia Plan  ASA: II  Anesthesia Plan: General   Post-op Pain Management:    Induction: Intravenous and Rapid sequence  Airway Management Planned: Oral ETT  Additional Equipment:   Intra-op Plan:   Post-operative Plan: Extubation in OR  Informed Consent: I have reviewed the patients History and Physical, chart, labs and discussed the procedure including the risks, benefits and alternatives for the proposed anesthesia with the patient or authorized representative who has indicated his/her understanding and acceptance.   Dental advisory given  Plan Discussed with: CRNA and Surgeon  Anesthesia Plan Comments: (Plan routine monitors, GETA)        Anesthesia Quick Evaluation

## 2015-10-26 NOTE — Transfer of Care (Signed)
Immediate Anesthesia Transfer of Care Note  Patient: Patty Ruiz  Procedure(s) Performed: Procedure(s): LAPAROSCOPY DIAGNOSTIC (N/A) APPENDECTOMY LAPAROSCOPIC (N/A)  Patient Location: PACU  Anesthesia Type:General  Level of Consciousness: awake, alert , oriented and patient cooperative  Airway & Oxygen Therapy: Patient Spontanous Breathing and Patient connected to face mask oxygen  Post-op Assessment: Report given to RN, Post -op Vital signs reviewed and stable and Patient moving all extremities  Post vital signs: Reviewed and stable  Last Vitals:  Filed Vitals:   10/26/15 0212 10/26/15 0522  BP: 122/85 113/78  Pulse: 80 76  Temp: 37.1 C 37.3 C  Resp: 18 18    Complications: No apparent anesthesia complications

## 2015-10-26 NOTE — Anesthesia Procedure Notes (Signed)
Date/Time: 10/26/2015 1:39 PM Performed by: Julian Reil Pre-anesthesia Checklist: Patient identified, Timeout performed, Emergency Drugs available, Suction available and Patient being monitored Patient Re-evaluated:Patient Re-evaluated prior to inductionOxygen Delivery Method: Circle system utilized Preoxygenation: Pre-oxygenation with 100% oxygen Intubation Type: IV induction Ventilation: Mask ventilation without difficulty Laryngoscope Size: 4 and Mac Grade View: Grade I Tube type: Oral Tube size: 7.0 mm Number of attempts: 1 Airway Equipment and Method: Stylet Placement Confirmation: positive ETCO2 and breath sounds checked- equal and bilateral Secured at: 22 cm Tube secured with: Tape Dental Injury: Teeth and Oropharynx as per pre-operative assessment

## 2015-10-26 NOTE — Op Note (Signed)
Appendectomy, Lap, Procedure Note  Indications: The patient presented with a history of right-sided abdominal pain. A CT  revealed findings consistent with acute appendicitis.She presents for appendectomy.  The procedure has been discussed with the patient.  Alternative therapies have been discussed with the patient.  Operative risks include bleeding,  Infection,  Organ injury,  Nerve injury,  Blood vessel injury,  DVT,  Pulmonary embolism,  Death,  And possible reoperation.  Medical management risks include worsening of present situation.  The success of the procedure is 50 -90 % at treating patients symptoms.  The patient understands and agrees to proceed.  Pre-operative Diagnosis: Acute appendicitis without mention of peritonitis  Post-operative Diagnosis: Same  Surgeon: Stella Encarnacion A.   Assistants: OR staff  Anesthesia: General endotracheal anesthesia and Local anesthesia 0.25.% bupivacaine, with epinephrine  ASA Class: 2  Procedure Details  The patient was seen again in the Holding Room. The risks, benefits, complications, treatment options, and expected outcomes were discussed with the patient and/or family. The possibilities of reaction to medication, pulmonary aspiration, perforation of viscus, bleeding, recurrent infection, finding a normal appendix, the need for additional procedures, failure to diagnose a condition, and creating a complication requiring transfusion or operation were discussed. There was concurrence with the proposed plan and informed consent was obtained. The site of surgery was properly noted/marked. The patient was taken to Operating Room, identified as Patty Ruiz and the procedure verified as Appendectomy. A Time Out was held and the above information confirmed.  The patient was placed in the supine position and general anesthesia was induced, along with placement of orogastric tube, Venodyne boots, and a Foley catheter. The abdomen was prepped and draped in a  sterile fashion. A one centimeter infraumbilical incision was made and the peritoneal cavity was accessed using the OPEN  technique. The pneumoperitoneum was then established to steady pressure of 12 mmHg. A 12 mm port was placed through the umbilical incision. Additional 5 mm cannulas then placed in the left lower quadrant of the abdomen and half way between the umbilicus and pubic symphysis under direct vision. A careful evaluation of the entire abdomen was carried out. The patient was placed in Trendelenburg and left lateral decubitus position. The small intestines were retracted in the cephalad and left lateral direction away from the pelvis and right lower quadrant. The patient was found to have an enlarged and inflamed appendix that was extending into the pelvis. There was no evidence of perforation.  The appendix was carefully dissected. A window was made in the mesoappendix at the base of the appendix. A harmonic scalpel was used across the mesoappendix. The appendix was divided at its base using an endo-GIA stapler. Minimal appendiceal stump was left in place. There was no evidence of bleeding, leakage, or complication after division of the appendix. Irrigation was also performed and irrigate suctioned from the abdomen as well.  The umbilical port site was closed using 0 vicryl pursestring sutures fashion at the level of the fascia. The trocar site skin wounds were closed using skin staples.  Instrument, sponge, and needle counts were correct at the conclusion of the case.   Findings: The appendix was found to be inflamed. There were not signs of necrosis.  There was not perforation. There was not abscess formation.  Estimated Blood Loss:  Minimal         Drains: none         Total IV Fluids: 700 mL  Specimens: appendix          Complications:  None; patient tolerated the procedure well.         Disposition: PACU - hemodynamically stable.         Condition: stable

## 2015-10-27 LAB — URINE CULTURE

## 2015-10-27 MED ORDER — OXYCODONE-ACETAMINOPHEN 5-325 MG PO TABS: 1.0000 | ORAL_TABLET | ORAL | Status: DC | PRN

## 2015-10-27 NOTE — Discharge Instructions (Signed)
Laparoscopic Appendectomy, Adult, Care After °Refer to this sheet in the next few weeks. These instructions provide you with information on caring for yourself after your procedure. Your caregiver may also give you more specific instructions. Your treatment has been planned according to current medical practices, but problems sometimes occur. Call your caregiver if you have any problems or questions after your procedure. °HOME CARE INSTRUCTIONS °· Do not drive while taking narcotic pain medicines. °· Use stool softener if you become constipated from your pain medicines. °· Change your bandages (dressings) as directed. °· Keep your wounds clean and dry. You may wash the wounds gently with soap and water. Gently pat the wounds dry with a clean towel. °· Do not take baths, swim, or use hot tubs for 10 days, or as instructed by your caregiver. °· Only take over-the-counter or prescription medicines for pain, discomfort, or fever as directed by your caregiver. °· You may continue your normal diet as directed. °· Do not lift more than 10 pounds (4.5 kg) or play contact sports for 3 weeks, or as directed. °· Slowly increase your activity after surgery. °· Take deep breaths to avoid getting a lung infection (pneumonia). °SEEK MEDICAL CARE IF: °· You have redness, swelling, or increasing pain in your wounds. °· You have pus coming from your wounds. °· You have drainage from a wound that lasts longer than 1 day. °· You notice a bad smell coming from the wounds or dressing. °· Your wound edges break open after stitches (sutures) have been removed. °· You notice increasing pain in the shoulders (shoulder strap areas) or near your shoulder blades. °· You develop dizzy episodes or fainting while standing. °· You develop shortness of breath. °· You develop persistent nausea or vomiting. °· You cannot control your bowel functions or lose your appetite. °· You develop diarrhea. °SEEK IMMEDIATE MEDICAL CARE IF:  °· You have a  fever. °· You develop a rash. °· You have difficulty breathing or sharp pains in your chest. °· You develop any reaction or side effects to medicines given. °MAKE SURE YOU: °· Understand these instructions. °· Will watch your condition. °· Will get help right away if you are not doing well or get worse. °  °This information is not intended to replace advice given to you by your health care provider. Make sure you discuss any questions you have with your health care provider. °  °Document Released: 11/20/2005 Document Revised: 04/06/2015 Document Reviewed: 05/10/2015 °Elsevier Interactive Patient Education ©2016 [employer] ° °[facility] ______[facility] °LAPAROSCOPIC SURGERY: POST OP INSTRUCTIONS °Always review your discharge instruction sheet given to you by the facility where your surgery was performed. °IF YOU HAVE DISABILITY OR FAMILY LEAVE FORMS, YOU MUST BRING THEM TO THE OFFICE FOR PROCESSING.   °DO NOT GIVE THEM TO YOUR DOCTOR. ° °1. A prescription for pain medication may be given to you upon discharge.  Take your pain medication as prescribed, if needed.  If narcotic pain medicine is not needed, then you may take acetaminophen (Tylenol) or ibuprofen (Advil) as needed. °2. Take your usually prescribed medications unless otherwise directed. °3. If you need a refill on your pain medication, please contact your pharmacy.  They will contact our office to request authorization. Prescriptions will not be filled after 5pm or on week-ends. °4. You should follow a light diet the first few days after arrival home, such as soup and crackers, etc.  Be sure to include lots of fluids daily. °5. Most   patients will experience some swelling and bruising in the area of the incisions.  Ice packs will help.  Swelling and bruising can take several days to resolve.  °6. It is common to experience some constipation if taking pain medication after surgery.  Increasing fluid intake and taking a stool softener (such as  Colace) will usually help or prevent this problem from occurring.  A mild laxative (Milk of Magnesia or Miralax) should be taken according to package instructions if there are no bowel movements after 48 hours. °7. Unless discharge instructions indicate otherwise, you may remove your bandages 24-48 hours after surgery, and you may shower at that time.  You may have steri-strips (small skin tapes) in place directly over the incision.  These strips should be left on the skin for 7-10 days.  If your surgeon used skin glue on the incision, you may shower in 24 hours.  The glue will flake off over the next 2-3 weeks.  Any sutures or staples will be removed at the office during your follow-up visit. °8. ACTIVITIES:  You may resume regular (light) daily activities beginning the next day--such as daily self-care, walking, climbing stairs--gradually increasing activities as tolerated.  You may have sexual intercourse when it is comfortable.  Refrain from any heavy lifting or straining until approved by your doctor. °a. You may drive when you are no longer taking prescription pain medication, you can comfortably wear a seatbelt, and you can safely maneuver your car and apply brakes. °b. RETURN TO WORK:  __________________________________________________________ °9. You should see your doctor in the office for a follow-up appointment approximately 2-3 weeks after your surgery.  Make sure that you call for this appointment within a day or two after you arrive home to insure a convenient appointment time. °10. OTHER INSTRUCTIONS: __________________________________________________________________________________________________________________________ __________________________________________________________________________________________________________________________ °WHEN TO CALL YOUR DOCTOR: °1. Fever over 101.0 °2. Inability to urinate °3. Continued bleeding from incision. °4. Increased pain, redness, or drainage from the  incision. °5. Increasing abdominal pain ° °The clinic staff is available to answer your questions during regular business hours.  Please don’t hesitate to call and ask to speak to one of the nurses for clinical concerns.  If you have a medical emergency, go to the nearest emergency room or call 911.  A surgeon from [facility] is always on call at the hospital. °[address], [address], [place], [place]  [address] ? [address] [address], [place], [place]   [address] °[phone] ? [phone] ? FAX [phone] °Web site: [email or web address] ° °

## 2015-10-27 NOTE — Progress Notes (Signed)
Patty Ruiz  to be D/C'd  per MD order. Discussed with the patient and all questions fully answered.  VSS, Skin clean, dry and intact without evidence of skin break down, no evidence of skin tears noted.  IV catheter discontinued intact. Site without signs and symptoms of complications. Dressing and pressure applied.  An After Visit Summary was printed and given to the patient. Patient received prescription.  D/c education completed with patient/family including follow up instructions, medication list, d/c activities limitations if indicated, with other d/c instructions as indicated by MD - patient able to verbalize understanding, all questions fully answered.   Patient instructed to return to ED, call 911, or call MD for any changes in condition.   Patient to be escorted via [place], and D/C home via private auto.

## 2015-10-27 NOTE — Progress Notes (Signed)
1 Day Post-Op  Subjective: She is doing much better.  Incisions all look good.  She has been up and walking some, had [place] for supper last PM  Objective: Vital signs in last 24 hours: Temp:  [97.5 F (36.4 C)-99.1 F (37.3 C)] 99.1 F (37.3 C) (11/23 0409) Pulse Rate:  [60-75] 68 (11/23 0409) Resp:  [15-18] 18 (11/23 0409) BP: (101-130)/(64-85) 101/64 mmHg (11/23 0409) SpO2:  [94 %-100 %] 96 % (11/23 0409) Last BM Date: 10/26/15 960 PO Afebrile, VSS Intake/Output from previous day: 11/22 0701 - 11/23 0700 In: 2596 [P.O.:960; I.V.:1436; IV Piggyback:200] Out: 3010 [Urine:3000; Blood:10] Intake/Output this shift:    General appearance: alert, cooperative and no distress GI: soft, sore, sites look fine  Lab Results:   Recent Labs  10/25/15 1234 10/26/15 0325  WBC 6.3 7.5  HGB 13.7 12.8  HCT 41.6 39.1  PLT 256 239    BMET  Recent Labs  10/25/15 1234 10/26/15 0325  NA 137 138  K 3.8 4.1  CL 105 107  CO2 22 23  GLUCOSE 104* 94  BUN 12 7  CREATININE 0.77 0.74  CALCIUM 9.1 8.6*   PT/INR No results for input(s): LABPROT, INR in the last 72 hours.   Recent Labs Lab 10/25/15 1234  AST 22  ALT 20  ALKPHOS 54  BILITOT 0.4  PROT 6.8  ALBUMIN 3.7     Lipase     Component Value Date/Time   LIPASE 36 10/25/2015 1234     Studies/Results: Ct Renal Stone Study  10/25/2015  CLINICAL DATA:  Right flank pain, hysterectomy, frequent urination EXAM: CT ABDOMEN AND PELVIS WITHOUT CONTRAST TECHNIQUE: Multidetector CT imaging of the abdomen and pelvis was performed following the standard protocol without IV contrast. COMPARISON:  05/02/2012 FINDINGS: There is levoscoliosis of upper lumbar spine. Sagittal images of the spine shows disc space flattening with vacuum disc phenomenon at L5-S1 level. Lung bases are unremarkable. The patient is status postcholecystectomy. Unenhanced liver shows no biliary ductal dilatation. Unenhanced pancreas, spleen and adrenal glands  are unremarkable. No aortic aneurysm. There is nonobstructive calcified calculus in lower pole of the left kidney measures 6 mm. No hydronephrosis or hydroureter.  No calcified ureteral calculi. No small bowel obstruction. No ascites or free air. No adenopathy. There is mild thickening of distal appendix best seen in axial image 60 and coronal image 51. Subtle mild stranding of periappendiceal fat. The appendix measures 9 mm in diameter. Findings suspicious for early tip appendicitis. Clinical correlation is necessary. The study is limited without IV and oral contrast. On the previous exam the appendix was small in caliber containing air. There is moderate stool in cecum right colon and proximal transverse colon. No colonic obstruction. The uterus is surgically absent. Mild prominent size right ovary measures 4.4 by 3.8 cm. Follicles are noted within right ovary. The left ovary is unremarkable. Moderate stool noted within rectosigmoid colon. Mild redundant sigmoid colon. IMPRESSION: 1. Mild prominent size distal appendix with subtle mild stranding of surrounding fat. Measures 9 mm in diameter please see axial images 60 and 61. Early tip appendicitis cannot be excluded. Clinical correlation is necessary. 2. Moderate stool noted in right colon and proximal transverse colon. No colitis. 3. No small bowel obstruction. 4. There is left nonobstructive nephrolithiasis. No hydronephrosis or hydroureter. No calcified ureteral calculi. 5. Surgically absent uterus. 6. Moderate stool noted within distal sigmoid colon and rectum. 7. Nonspecific mild prominent size unenhanced right ovary measures 4.4 x 3.8 cm. These results  were called by telephone at the time of interpretation on 10/25/2015 at 3:03 pm to Dr. Leo Grosser , who verbally acknowledged these results. Electronically Signed   By: Lahoma Crocker M.D.   On: 10/25/2015 15:04    Medications: . cefTRIAXone (ROCEPHIN)  IV  2 g Intravenous Q24H  . metronidazole  500 mg  Intravenous Q8H  . traZODone  50 mg Oral QHS    Assessment/Plan Acute appendicitis without mention of peritonitis Hx of chronic back pain Hx of endometriosis Recent hysterectomy PCOD Hypothyroid Hx of TBI Gluten intolerant  Antibiotics: ceftriaxone/Flagyl x 1 day DVT: SCD    Plan;  Home after breakfast this AM.      Earnstine Regal 10/27/2015

## 2015-10-29 NOTE — Progress Notes (Cosign Needed)
Physician Discharge Summary  Patient ID: Patty Ruiz MRN: [id number] DOB/AGE: [birth year] 43 y.o.  Admit date: 10/25/2015 Discharge date: 10/29/2015  Admission Diagnoses:  Abdominal pain with probable early appendicitis Hx of chronic back pain Hx of endometriosis Recent hysterectomy PCOD Hypothyroid Hx of TBI Gluten intolerant   Discharge Diagnoses:  SAME  Principal Problem:   Abdominal pain, possible appendicitis Active Problems:   Ovarian enlargement, right 4.7 cm on CT scan   PROCEDURES: Laparoscopic appendectomy  PATHOLOGY:  Appendix, Other than Incidental - BENIGN FIBROUS OBLITERATION OF APPENDIX. - NEGATIVE FOR ATYPIA AND MALIGNANCY.  Hospital Course: Pt presents to the ED with 4 days of abdominal, back, and right sided pain starting in the AM 10/21/15. Marland Kitchen She had pain but was able to eat and drink initially and went to her chiropractor on 10/22/15. They did not make her feel better. She has continued to have pain with abdomen and back, she started having nausea and worsening pain. She feels like she needs to void all the time. Was concerned she had UTI, and today presented to the ED. On initial exam the ED doctor thought she had kidney stones. Her work up in the ED is showing no fever, VSS. Labs show WBC of 6.3, normal CMP. UA is also unremarkable. CT scan shows: Mild prominent size distal appendix with subtle mild stranding of surrounding fat. Measures 9 mm in diameter. Early tip appendicitis cannot be excluded. Moderate stool noted in right colon, proximal transverse colon, distal sigmoid and rectum. No colitis. NO SBO. There is left nonobstructive nephrolithiasis. No hydronephrosis or hydroureter. No calcified ureteral calculi. Uterus is absent, right ovary was normal. On our exam in the ED she was not having much pain.  We admitted her for observation and repeat labs in the AM. Again she complained of significant pain, labs were again normal.  She was seen  and taken to the OR for exploratory laparoscopic exam, and appendectomy.  She did well.  She tolerated [place] food post op and was ready for discharge the following AM.   Condition on D/C:  Improved  CBC Latest Ref Rng 10/26/2015 10/25/2015 03/16/2015  WBC 4.0 - 10.5 K/uL 7.5 6.3 9.2  Hemoglobin 12.0 - 15.0 g/dL 12.8 13.7 10.8(L)  Hematocrit 36.0 - 46.0 % 39.1 41.6 33.5(L)  Platelets 150 - 400 K/uL 239 256 269   CMP  CMP Latest Ref Rng 10/26/2015 10/25/2015 06/24/2011  Glucose 65 - 99 mg/dL 94 104(H) 89  BUN 6 - 20 mg/dL 7 12 21   Creatinine 0.44 - 1.00 mg/dL 0.74 0.77 0.70  Sodium 135 - 145 mmol/L 138 137 140  Potassium 3.5 - 5.1 mmol/L 4.1 3.8 4.6  Chloride 101 - 111 mmol/L 107 105 109  CO2 22 - 32 mmol/L 23 22 -  Calcium 8.9 - 10.3 mg/dL 8.6(L) 9.1 -  Total Protein 6.5 - 8.1 g/dL - 6.8 6.6  Total Bilirubin 0.3 - 1.2 mg/dL - 0.4 0.2(L)  Alkaline Phos 38 - 126 U/L - 54 65  AST 15 - 41 U/L - 22 45(H)  ALT 14 - 54 U/L - 20 30     Disposition: 01-Home or Self Care     Medication List    TAKE these medications        acetaminophen 500 MG tablet  Commonly known as:  TYLENOL  Take 500 mg by mouth every 6 (six) hours as needed for mild pain or moderate pain.     aspirin-acetaminophen-caffeine 250-250-65 MG tablet  Commonly known as:  EXCEDRIN MIGRAINE  Take 1 tablet by mouth every 6 (six) hours as needed for headache.     EPIPEN 2-PAK 0.3 mg/0.3 mL Soaj injection  Generic drug:  EPINEPHrine  See admin instructions.     ibuprofen 600 MG tablet  Commonly known as:  ADVIL  Take 1 tablet (600 mg total) by mouth every 6 (six) hours as needed.     mometasone 50 MCG/ACT nasal spray  Commonly known as:  NASONEX  Place 2 sprays into the nose daily as needed (congestion).     multivitamin with minerals tablet  Take 1 tablet by mouth daily.     oxyCODONE-acetaminophen 5-325 MG tablet  Commonly known as:  PERCOCET/ROXICET  Take 1-2 tablets by mouth every 4 (four)  hours as needed for moderate pain.     TIROSINT 125 MCG Caps  Generic drug:  Levothyroxine Sodium  Take 125 mcg by mouth at bedtime.     traZODone 100 MG tablet  Commonly known as:  DESYREL  Take 100 mg by mouth at bedtime.           Follow-up Information    Follow up with [facility] On 11/17/2015.   Specialty:  General Surgery   Why:  Your appointment is at 8:30 AM, be at the office at 8 AM for check in.   Contact information:   [address] [place] [address] [phone]       Follow up with FULP, CAMMIE, MD.   Specialty:  Family Medicine   Why:  See as needed.   Contact information:   [id number] [address] [phone]       Signed: Earnstine Regal 10/29/2015, 11:11 AM

## 2015-10-31 NOTE — Discharge Summary (Signed)
Earnstine Regal, PA-C Physician Assistant Cosign Needed Surgery Progress Notes 10/27/2015 9:02 AM    Expand All Collapse All   Physician Discharge Summary  Patient ID: Patty Ruiz MRN: [id number] DOB/AGE: [birth year] 44 y.o.  Admit date: 10/25/2015 Discharge date: 10/29/2015  Admission Diagnoses:  Abdominal pain with probable early appendicitis Hx of chronic back pain Hx of endometriosis Recent hysterectomy PCOD Hypothyroid Hx of TBI Gluten intolerant   Discharge Diagnoses:  SAME  Principal Problem:  Abdominal pain, possible appendicitis Active Problems:  Ovarian enlargement, right 4.7 cm on CT scan   PROCEDURES: Laparoscopic appendectomy  PATHOLOGY: Appendix, Other than Incidental - BENIGN FIBROUS OBLITERATION OF APPENDIX. - NEGATIVE FOR ATYPIA AND MALIGNANCY.  Hospital Course: Pt presents to the ED with 4 days of abdominal, back, and right sided pain starting in the AM 10/21/15. Marland Kitchen She had pain but was able to eat and drink initially and went to her chiropractor on 10/22/15. They did not make her feel better. She has continued to have pain with abdomen and back, she started having nausea and worsening pain. She feels like she needs to void all the time. Was concerned she had UTI, and today presented to the ED. On initial exam the ED doctor thought she had kidney stones. Her work up in the ED is showing no fever, VSS. Labs show WBC of 6.3, normal CMP. UA is also unremarkable. CT scan shows: Mild prominent size distal appendix with subtle mild stranding of surrounding fat. Measures 9 mm in diameter. Early tip appendicitis cannot be excluded. Moderate stool noted in right colon, proximal transverse colon, distal sigmoid and rectum. No colitis. NO SBO. There is left nonobstructive nephrolithiasis. No hydronephrosis or hydroureter. No calcified ureteral calculi. Uterus is absent, right ovary was normal. On our exam in the ED she was not having much pain. We  admitted her for observation and repeat labs in the AM. Again she complained of significant pain, labs were again normal. She was seen and taken to the OR for exploratory laparoscopic exam, and appendectomy. She did well. She tolerated [place] food post op and was ready for discharge the following AM.   Condition on D/C: Improved  CBC Latest Ref Rng 10/26/2015 10/25/2015 03/16/2015  WBC 4.0 - 10.5 K/uL 7.5 6.3 9.2  Hemoglobin 12.0 - 15.0 g/dL 12.8 13.7 10.8(L)  Hematocrit 36.0 - 46.0 % 39.1 41.6 33.5(L)  Platelets 150 - 400 K/uL 239 256 269   CMP CMP Latest Ref Rng 10/26/2015 10/25/2015 06/24/2011  Glucose 65 - 99 mg/dL 94 104(H) 89  BUN 6 - 20 mg/dL 7 12 21   Creatinine 0.44 - 1.00 mg/dL 0.74 0.77 0.70  Sodium 135 - 145 mmol/L 138 137 140  Potassium 3.5 - 5.1 mmol/L 4.1 3.8 4.6  Chloride 101 - 111 mmol/L 107 105 109  CO2 22 - 32 mmol/L 23 22 -  Calcium 8.9 - 10.3 mg/dL 8.6(L) 9.1 -  Total Protein 6.5 - 8.1 g/dL - 6.8 6.6  Total Bilirubin 0.3 - 1.2 mg/dL - 0.4 0.2(L)  Alkaline Phos 38 - 126 U/L - 54 65  AST 15 - 41 U/L - 22 45(H)  ALT 14 - 54 U/L - 20 30   Pathology: Appendix, Other than Incidental - BENIGN FIBROUS OBLITERATION OF APPENDIX. - NEGATIVE FOR ATYPIA AND MALIGNANCY    Disposition: 01-Home or Self Care     Medication List    TAKE these medications       acetaminophen 500 MG tablet  Commonly known  as: TYLENOL  Take 500 mg by mouth every 6 (six) hours as needed for mild pain or moderate pain.     aspirin-acetaminophen-caffeine 250-250-65 MG tablet  Commonly known as: EXCEDRIN MIGRAINE  Take 1 tablet by mouth every 6 (six) hours as needed for headache.     EPIPEN 2-PAK 0.3 mg/0.3 mL Soaj injection  Generic drug: EPINEPHrine  See admin instructions.     ibuprofen 600 MG tablet  Commonly known as: ADVIL  Take 1 tablet  (600 mg total) by mouth every 6 (six) hours as needed.     mometasone 50 MCG/ACT nasal spray  Commonly known as: NASONEX  Place 2 sprays into the nose daily as needed (congestion).     multivitamin with minerals tablet  Take 1 tablet by mouth daily.     oxyCODONE-acetaminophen 5-325 MG tablet  Commonly known as: PERCOCET/ROXICET  Take 1-2 tablets by mouth every 4 (four) hours as needed for moderate pain.     TIROSINT 125 MCG Caps  Generic drug: Levothyroxine Sodium  Take 125 mcg by mouth at bedtime.     traZODone 100 MG tablet  Commonly known as: DESYREL  Take 100 mg by mouth at bedtime.           Follow-up Information    Follow up with [facility] On 11/17/2015.   Specialty: General Surgery   Why: Your appointment is at 8:30 AM, be at the office at 8 AM for check in.   Contact information:   [place] [address] [phone]       Follow up with FULP, CAMMIE, MD.   Specialty: Family Medicine   Why: See as needed.   Contact information:   [id number] [place] [address] [phone]       Signed: Earnstine Regal 10/29/2015, 11:11 AM

## 2015-11-01 ENCOUNTER — Encounter (HOSPITAL_COMMUNITY): Payer: Self-pay | Admitting: Surgery

## 2015-12-05 HISTORY — PX: BREAST EXCISIONAL BIOPSY: SUR124

## 2016-03-03 ENCOUNTER — Other Ambulatory Visit: Payer: Self-pay | Admitting: Family Medicine

## 2016-03-03 DIAGNOSIS — M419 Scoliosis, unspecified: Secondary | ICD-10-CM

## 2016-03-11 ENCOUNTER — Ambulatory Visit
Admission: RE | Admit: 2016-03-11 | Discharge: 2016-03-11 | Disposition: A | Discharge status: Home / Self Care | Payer: 59 | Source: Ambulatory Visit | Attending: Family Medicine | Admitting: Family Medicine

## 2016-03-11 DIAGNOSIS — M419 Scoliosis, unspecified: Secondary | ICD-10-CM

## 2016-04-07 ENCOUNTER — Other Ambulatory Visit: Payer: Self-pay | Admitting: *Deleted

## 2016-04-07 MED ORDER — FLUTICASONE PROPIONATE 50 MCG/ACT NA SUSP: 1.0000 | Freq: Two times a day (BID) | NASAL | Status: DC

## 2016-09-08 ENCOUNTER — Other Ambulatory Visit: Payer: Self-pay | Admitting: Obstetrics and Gynecology

## 2016-09-08 DIAGNOSIS — Z1231 Encounter for screening mammogram for malignant neoplasm of breast: Secondary | ICD-10-CM

## 2016-10-13 ENCOUNTER — Ambulatory Visit: Payer: 59

## 2016-11-03 LAB — HM PAP SMEAR

## 2016-11-03 LAB — HM MAMMOGRAPHY: HM Mammogram: NORMAL (ref 0–4)

## 2016-11-24 ENCOUNTER — Ambulatory Visit
Admission: RE | Admit: 2016-11-24 | Discharge: 2016-11-24 | Disposition: A | Discharge status: Home / Self Care | Payer: 59 | Source: Ambulatory Visit | Attending: Obstetrics and Gynecology | Admitting: Obstetrics and Gynecology

## 2016-11-24 DIAGNOSIS — Z1231 Encounter for screening mammogram for malignant neoplasm of breast: Secondary | ICD-10-CM

## 2016-11-30 ENCOUNTER — Other Ambulatory Visit: Payer: Self-pay | Admitting: Obstetrics and Gynecology

## 2016-11-30 DIAGNOSIS — R928 Other abnormal and inconclusive findings on diagnostic imaging of breast: Secondary | ICD-10-CM

## 2016-12-01 ENCOUNTER — Ambulatory Visit
Admission: RE | Admit: 2016-12-01 | Discharge: 2016-12-01 | Disposition: A | Discharge status: Home / Self Care | Payer: 59 | Source: Ambulatory Visit | Attending: Obstetrics and Gynecology | Admitting: Obstetrics and Gynecology

## 2016-12-01 DIAGNOSIS — R928 Other abnormal and inconclusive findings on diagnostic imaging of breast: Secondary | ICD-10-CM

## 2016-12-19 DIAGNOSIS — M9901 Segmental and somatic dysfunction of cervical region: Secondary | ICD-10-CM | POA: Diagnosis not present

## 2016-12-19 DIAGNOSIS — M5032 Other cervical disc degeneration, mid-cervical region, unspecified level: Secondary | ICD-10-CM | POA: Diagnosis not present

## 2016-12-19 DIAGNOSIS — M531 Cervicobrachial syndrome: Secondary | ICD-10-CM | POA: Diagnosis not present

## 2017-01-09 ENCOUNTER — Telehealth: Payer: Self-pay | Admitting: *Deleted

## 2017-01-09 DIAGNOSIS — M5032 Other cervical disc degeneration, mid-cervical region, unspecified level: Secondary | ICD-10-CM | POA: Diagnosis not present

## 2017-01-09 DIAGNOSIS — M9901 Segmental and somatic dysfunction of cervical region: Secondary | ICD-10-CM | POA: Diagnosis not present

## 2017-01-09 DIAGNOSIS — M531 Cervicobrachial syndrome: Secondary | ICD-10-CM | POA: Diagnosis not present

## 2017-01-09 NOTE — Telephone Encounter (Signed)
Ok to establish 

## 2017-01-09 NOTE — Telephone Encounter (Signed)
Calling to see if Dr. Birdie Riddle is accepting new patients.   She works for Dr. Silvano Bilis who referred her.    Patient stated that she would like an appointment sooner if she could because she is on medications that she would like for a PCP to start handling.    Routed to provider to advise.

## 2017-01-26 DIAGNOSIS — M5032 Other cervical disc degeneration, mid-cervical region, unspecified level: Secondary | ICD-10-CM | POA: Diagnosis not present

## 2017-01-26 DIAGNOSIS — M9901 Segmental and somatic dysfunction of cervical region: Secondary | ICD-10-CM | POA: Diagnosis not present

## 2017-01-26 DIAGNOSIS — M531 Cervicobrachial syndrome: Secondary | ICD-10-CM | POA: Diagnosis not present

## 2017-03-14 DIAGNOSIS — M531 Cervicobrachial syndrome: Secondary | ICD-10-CM | POA: Diagnosis not present

## 2017-03-14 DIAGNOSIS — M9901 Segmental and somatic dysfunction of cervical region: Secondary | ICD-10-CM | POA: Diagnosis not present

## 2017-03-14 DIAGNOSIS — M5032 Other cervical disc degeneration, mid-cervical region, unspecified level: Secondary | ICD-10-CM | POA: Diagnosis not present

## 2017-04-09 DIAGNOSIS — E039 Hypothyroidism, unspecified: Secondary | ICD-10-CM | POA: Diagnosis not present

## 2017-04-16 DIAGNOSIS — E559 Vitamin D deficiency, unspecified: Secondary | ICD-10-CM | POA: Diagnosis not present

## 2017-04-16 DIAGNOSIS — E039 Hypothyroidism, unspecified: Secondary | ICD-10-CM | POA: Diagnosis not present

## 2017-04-19 ENCOUNTER — Encounter: Payer: Self-pay | Admitting: Family Medicine

## 2017-04-19 ENCOUNTER — Ambulatory Visit (INDEPENDENT_AMBULATORY_CARE_PROVIDER_SITE_OTHER): Payer: 59 | Admitting: Family Medicine

## 2017-04-19 DIAGNOSIS — F419 Anxiety disorder, unspecified: Secondary | ICD-10-CM | POA: Diagnosis not present

## 2017-04-19 DIAGNOSIS — G4700 Insomnia, unspecified: Secondary | ICD-10-CM

## 2017-04-19 DIAGNOSIS — M9983 Other biomechanical lesions of lumbar region: Secondary | ICD-10-CM | POA: Diagnosis not present

## 2017-04-19 DIAGNOSIS — E039 Hypothyroidism, unspecified: Secondary | ICD-10-CM | POA: Insufficient documentation

## 2017-04-19 DIAGNOSIS — M4807 Spinal stenosis, lumbosacral region: Secondary | ICD-10-CM

## 2017-04-19 MED ORDER — SUVOREXANT 10 MG PO TABS: 1.0000 | ORAL_TABLET | Freq: Every day | ORAL | 3 refills | Status: DC

## 2017-04-19 NOTE — Assessment & Plan Note (Signed)
New to provider, ongoing for pt.  Currently on Wellbutrin from Dr Haroldine Laws.  Anxiety worsens w/ poor sleep.  Will work on sleep w/ Belsomra to improve anxiety and follow closely.

## 2017-04-19 NOTE — Progress Notes (Signed)
Pre visit review using our clinic review tool, if applicable. No additional management support is needed unless otherwise documented below in the visit note. 

## 2017-04-19 NOTE — Patient Instructions (Signed)
Follow up in 3-4 weeks to recheck sleep Start the Belsomra nightly in place of the Trazodone We'll call you with your referral- if you don't hear in the next 1-2 weeks, please let me know! Call with any questions or concerns Welcome!  We're glad to have you!!!

## 2017-04-19 NOTE — Assessment & Plan Note (Signed)
New to provider, ongoing for pt.  Reviewed MRI.  Pt has seen Dr Vertell Limber previously but is interested in 2nd opinion prior to agreeing to surgery.  She is in chronic pain but not currently on pain medications b/c she doesn't like how they make her feel.  Refer to Dr Sherlyn Lick at pt's request

## 2017-04-19 NOTE — Assessment & Plan Note (Signed)
New to provider, ongoing for pt.  This seems to be the most distressing symptom of her anxiety.  She reports she is taking 25mg  of Trazodone b/c she wakes feeling groggy but not well rested.  Ambien is activating for her and does not work.  Will start Belsomra in place of Trazodone and monitor for improvement.  Pt expressed understanding and is in agreement w/ plan.

## 2017-04-19 NOTE — Assessment & Plan Note (Signed)
New to provider, ongoing for pt.  Following w/ Dr Chalmers Cater.  Will follow along and assist as able.

## 2017-04-19 NOTE — Progress Notes (Signed)
   Subjective:    Patient ID: Patty Ruiz, female    DOB: 02/11/1971, 46 y.o.   MRN: [id number]  HPI New to establish.  Previous MD- Fulp  GYN- Julien Girt, Neurosurg- Vertell Limber, Endo- Chalmers Cater, Ophtho- Dr Prudencio Burly, Anxiety- Dr Haroldine Laws, Urology- Ottelin  LBP- chronic problem since 2009.  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  Not currently on pain meds.  Using Ibuprofen as needed.  Pt was given the name of Dr Sherlyn Lick in [facility] for 2nd opinion.  Hypothyroidism- chronic problem, following w/ Dr Chalmers Cater.  Just had recent appt and 'everything was fine'.    Anxiety- chronic problem, on Wellbutrin and Trazodone w/ adequate relief.  Trazodone causes AM hangover so she takes 25mg  daily.  Insomnia is pt's biggest sx.   Review of Systems For ROS see HPI     Objective:   Physical Exam  Constitutional: She is oriented to person, place, and time. She appears well-developed and well-nourished. No distress.  HENT:  Head: Normocephalic and atraumatic.  Eyes: Conjunctivae and EOM are normal. Pupils are equal, round, and reactive to light.  Neck: Normal range of motion. Neck supple. No thyromegaly present.  Cardiovascular: Normal rate, regular rhythm, normal heart sounds and intact distal pulses.   No murmur heard. Pulmonary/Chest: Effort normal and breath sounds normal. No respiratory distress.  Abdominal: Soft. She exhibits no distension. There is no tenderness.  Musculoskeletal: She exhibits no edema.  Lymphadenopathy:    She has no cervical adenopathy.  Neurological: She is alert and oriented to person, place, and time.  Skin: Skin is warm and dry.  Psychiatric: She has a normal mood and affect. Her behavior is normal.  Vitals reviewed.         Assessment & Plan:

## 2017-04-27 ENCOUNTER — Telehealth: Payer: Self-pay | Admitting: General Practice

## 2017-04-27 NOTE — Telephone Encounter (Signed)
PA for belsomra received,Began by calling [facility] at [phone]. This was approved for 24 months until 04/28/2019.   Fax confirmation sent to pt pharmacy.

## 2017-05-02 DIAGNOSIS — M4120 Other idiopathic scoliosis, site unspecified: Secondary | ICD-10-CM | POA: Diagnosis not present

## 2017-05-02 DIAGNOSIS — M4317 Spondylolisthesis, lumbosacral region: Secondary | ICD-10-CM | POA: Diagnosis not present

## 2017-05-02 DIAGNOSIS — M545 Low back pain: Secondary | ICD-10-CM | POA: Diagnosis not present

## 2017-05-03 DIAGNOSIS — M4726 Other spondylosis with radiculopathy, lumbar region: Secondary | ICD-10-CM | POA: Diagnosis not present

## 2017-05-03 DIAGNOSIS — Q762 Congenital spondylolisthesis: Secondary | ICD-10-CM | POA: Diagnosis not present

## 2017-05-03 DIAGNOSIS — M48062 Spinal stenosis, lumbar region with neurogenic claudication: Secondary | ICD-10-CM | POA: Diagnosis not present

## 2017-05-09 ENCOUNTER — Other Ambulatory Visit: Payer: Self-pay | Admitting: Orthopedic Surgery

## 2017-05-09 DIAGNOSIS — M4726 Other spondylosis with radiculopathy, lumbar region: Secondary | ICD-10-CM

## 2017-05-13 ENCOUNTER — Ambulatory Visit
Admission: RE | Admit: 2017-05-13 | Discharge: 2017-05-13 | Disposition: A | Discharge status: Home / Self Care | Payer: 59 | Source: Ambulatory Visit | Attending: Orthopedic Surgery | Admitting: Orthopedic Surgery

## 2017-05-13 DIAGNOSIS — M47816 Spondylosis without myelopathy or radiculopathy, lumbar region: Secondary | ICD-10-CM | POA: Diagnosis not present

## 2017-05-13 DIAGNOSIS — M4726 Other spondylosis with radiculopathy, lumbar region: Secondary | ICD-10-CM

## 2017-05-14 ENCOUNTER — Ambulatory Visit: Payer: 59 | Admitting: Family Medicine

## 2017-05-18 DIAGNOSIS — M419 Scoliosis, unspecified: Secondary | ICD-10-CM | POA: Diagnosis not present

## 2017-05-18 DIAGNOSIS — M4726 Other spondylosis with radiculopathy, lumbar region: Secondary | ICD-10-CM | POA: Diagnosis not present

## 2017-05-18 DIAGNOSIS — Q762 Congenital spondylolisthesis: Secondary | ICD-10-CM | POA: Diagnosis not present

## 2017-05-18 DIAGNOSIS — M48062 Spinal stenosis, lumbar region with neurogenic claudication: Secondary | ICD-10-CM | POA: Diagnosis not present

## 2017-05-23 ENCOUNTER — Encounter: Payer: Self-pay | Admitting: Physician Assistant

## 2017-05-23 ENCOUNTER — Ambulatory Visit (INDEPENDENT_AMBULATORY_CARE_PROVIDER_SITE_OTHER): Payer: 59 | Admitting: Physician Assistant

## 2017-05-23 VITALS — BP 122/82 | HR 97 | Temp 98.4°F | Resp 14 | Ht 65.0 in | Wt 185.0 lb

## 2017-05-23 DIAGNOSIS — B9689 Other specified bacterial agents as the cause of diseases classified elsewhere: Secondary | ICD-10-CM

## 2017-05-23 DIAGNOSIS — J019 Acute sinusitis, unspecified: Secondary | ICD-10-CM | POA: Diagnosis not present

## 2017-05-23 MED ORDER — FLUTICASONE PROPIONATE 50 MCG/ACT NA SUSP: 2.0000 | Freq: Every day | NASAL | 1 refills | Status: DC

## 2017-05-23 MED ORDER — AMOXICILLIN-POT CLAVULANATE 875-125 MG PO TABS: 1.0000 | ORAL_TABLET | Freq: Two times a day (BID) | ORAL | 0 refills | Status: DC

## 2017-05-23 NOTE — Progress Notes (Signed)
Patient presents to clinic today c/o 3 weeks of R sided sinus pressure, sinus pain and tooth pain. Notes significant R ear pressure and popping. Notes some left-sided sinus pressure without pain. This morning, cough is productive of thicker sputum and notes raw throat. Has taken some Ibuprofen and Claritin for symptom relief.   Past Medical History:  Diagnosis Date  . Dysmenorrhea   . Endometriosis   . Family history of breast cancer   . Gluten intolerance   . 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 Hyperlipidemia   . Hypothyroidism   . Menorrhagia   . PCOD (polycystic ovarian disease)   . PONV (postoperative nausea and vomiting)   . 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 acetaminophen (TYLENOL) 500 MG tablet Take 500 mg by mouth every 6 (six) hours as needed for mild pain or moderate pain.    Marland Kitchen aspirin-acetaminophen-caffeine (EXCEDRIN MIGRAINE) 250-250-65 MG tablet Take 1 tablet by mouth every 6 (six) hours as needed for headache.    Marland Kitchen buPROPion (WELLBUTRIN XL) 150 MG 24 hr tablet Take 150 mg by mouth daily.    Marland Kitchen EPIPEN 2-PAK 0.3 MG/0.3ML SOAJ injection See admin instructions.  0  . fluticasone (FLONASE) 50 MCG/ACT nasal spray Place 1 spray into both nostrils 2 (two) times daily. 16 g 1  . Multiple Vitamins-Minerals (MULTIVITAMIN WITH MINERALS) tablet Take 1 tablet by mouth daily.    . Suvorexant (BELSOMRA) 10 MG TABS Take 1 tablet by mouth at bedtime. 30 tablet 3  . thyroid (ARMOUR) 60 MG tablet Take 60 mg by mouth daily before breakfast. Pt takes 131m total    . thyroid (ARMOUR) 90 MG tablet Take 90 mg by mouth daily. Pt takes 1519mtotal     No current facility-administered medications on file prior to visit.     Allergies  Allergen Reactions  . Ciprofloxacin Anaphylaxis,  Shortness Of Breath and Swelling  . Codeine Shortness Of Breath and Swelling  . Morphine And Related Shortness Of Breath, Swelling and Other (See Comments)    Severe abdominal cramps, causes what appears to be pancreatitis.  . Shellfish Allergy Anaphylaxis    Shrimp, lobster, crab  . Other     GLUTEN INTOLERENCE  . Adhesive [Tape] Rash    Looks like poison oak where applied    Family History  Problem Relation Age of Onset  . Cancer Father [id number]     melanoma - retinal  . Heart disease Maternal Aunt   . Heart disease Maternal Uncle   . Cancer Paternal Aunt 5375     breast cancer- reportedly BRCA+ but report not available at present time  . Cancer Paternal Grandfather 7570     prostate  . Heart attack Paternal Grandfather   . Cancer Other        reportedly has 2nd cousin on maternal side that is also BRCA+ but no report available  . Heart disease Maternal Aunt   . Heart disease Maternal Aunt     Social History   Social History  . Marital status: Married    Spouse name: N/A  . Number of children: N/A  . Years of education: N/A   Social History Main Topics  . Smoking status: Never Smoker  .  Smokeless tobacco: Never Used  . Alcohol use No  . Drug use: No  . Sexual activity: Yes   Other Topics Concern  . None   Social History Narrative  . None   Review of Systems - See HPI.  All other ROS are negative.  BP 122/82   Pulse 97   Temp 98.4 F (36.9 C) (Oral)   Resp 14   Ht '5\' 5"'  (1.651 m)   Wt 185 lb (83.9 kg)   LMP 02/18/2015 (Exact Date)   SpO2 97%   BMI 30.79 kg/m   Physical Exam  Constitutional: She is oriented to person, place, and time and well-developed, well-nourished, and in no distress.  HENT:  Head: Normocephalic and atraumatic.  Right Ear: Tympanic membrane normal.  Left Ear: Tympanic membrane normal.  Nose: Mucosal edema and rhinorrhea present. Right sinus exhibits maxillary sinus tenderness and frontal sinus tenderness.  Mouth/Throat: Uvula is  midline, oropharynx is clear and moist and mucous membranes are normal.  Eyes: Conjunctivae are normal.  Neck: Neck supple.  Cardiovascular: Normal rate, regular rhythm, normal heart sounds and intact distal pulses.   Pulmonary/Chest: Effort normal and breath sounds normal. No respiratory distress. She has no wheezes. She has no rales. She exhibits no tenderness.  Neurological: She is alert and oriented to person, place, and time.  Vitals reviewed.  Assessment/Plan: 1. Acute bacterial sinusitis Rx Augmentin.  Increase fluids.  Rest.  Saline nasal spray.  Probiotic.  Mucinex as directed.  Humidifier in bedroom. Restart Flonase.  Call or return to clinic if symptoms are not improving.  - amoxicillin-clavulanate (AUGMENTIN) 875-125 MG tablet; Take 1 tablet by mouth 2 (two) times daily.  Dispense: 14 tablet; Refill: 0   Leeanne Rio, [place]

## 2017-05-23 NOTE — Patient Instructions (Signed)
Please take antibiotic as directed.  Increase fluid intake.  Use Saline nasal spray.  Take a daily multivitamin. Restart Flonase.  Place a humidifier in the bedroom.  Please call or return clinic if symptoms are not improving.  Sinusitis Sinusitis is redness, soreness, and swelling (inflammation) of the paranasal sinuses. Paranasal sinuses are air pockets within the bones of your face (beneath the eyes, the middle of the forehead, or above the eyes). In healthy paranasal sinuses, mucus is able to drain out, and air is able to circulate through them by way of your nose. However, when your paranasal sinuses are inflamed, mucus and air can become trapped. This can allow bacteria and other germs to grow and cause infection. Sinusitis can develop quickly and last only a short time (acute) or continue over a long period (chronic). Sinusitis that lasts for more than 12 weeks is considered chronic.  CAUSES  Causes of sinusitis include:  Allergies.  Structural abnormalities, such as displacement of the cartilage that separates your nostrils (deviated septum), which can decrease the air flow through your nose and sinuses and affect sinus drainage.  Functional abnormalities, such as when the small hairs (cilia) that line your sinuses and help remove mucus do not work properly or are not present. SYMPTOMS  Symptoms of acute and chronic sinusitis are the same. The primary symptoms are pain and pressure around the affected sinuses. Other symptoms include:  Upper toothache.  Earache.  Headache.  Bad breath.  Decreased sense of smell and taste.  A cough, which worsens when you are lying flat.  Fatigue.  Fever.  Thick drainage from your nose, which often is green and may contain pus (purulent).  Swelling and warmth over the affected sinuses. DIAGNOSIS  Your caregiver will perform a physical exam. During the exam, your caregiver may:  Look in your nose for signs of abnormal growths in your  nostrils (nasal polyps).  Tap over the affected sinus to check for signs of infection.  View the inside of your sinuses (endoscopy) with a special imaging device with a light attached (endoscope), which is inserted into your sinuses. If your caregiver suspects that you have chronic sinusitis, one or more of the following tests may be recommended:  Allergy tests.  Nasal culture A sample of mucus is taken from your nose and sent to a lab and screened for bacteria.  Nasal cytology A sample of mucus is taken from your nose and examined by your caregiver to determine if your sinusitis is related to an allergy. TREATMENT  Most cases of acute sinusitis are related to a viral infection and will resolve on their own within 10 days. Sometimes medicines are prescribed to help relieve symptoms (pain medicine, decongestants, nasal steroid sprays, or saline sprays).  However, for sinusitis related to a bacterial infection, your caregiver will prescribe antibiotic medicines. These are medicines that will help kill the bacteria causing the infection.  Rarely, sinusitis is caused by a fungal infection. In theses cases, your caregiver will prescribe antifungal medicine. For some cases of chronic sinusitis, surgery is needed. Generally, these are cases in which sinusitis recurs more than 3 times per year, despite other treatments. HOME CARE INSTRUCTIONS   Drink plenty of water. Water helps thin the mucus so your sinuses can drain more easily.  Use a humidifier.  Inhale steam 3 to 4 times a day (for example, sit in the bathroom with the shower running).  Apply a warm, moist washcloth to your face 3 to 4 times  a day, or as directed by your caregiver.  Use saline nasal sprays to help moisten and clean your sinuses.  Take over-the-counter or prescription medicines for pain, discomfort, or fever only as directed by your caregiver. SEEK IMMEDIATE MEDICAL CARE IF:  You have increasing pain or severe  headaches.  You have nausea, vomiting, or drowsiness.  You have swelling around your face.  You have vision problems.  You have a stiff neck.  You have difficulty breathing. MAKE SURE YOU:   Understand these instructions.  Will watch your condition.  Will get help right away if you are not doing well or get worse. Document Released: 11/20/2005 Document Revised: 02/12/2012 Document Reviewed: 12/05/2011 [facility] Patient Information 2014 [place], [place].

## 2017-05-23 NOTE — Progress Notes (Signed)
Pre visit review using our clinic review tool, if applicable. No additional management support is needed unless otherwise documented below in the visit note. 

## 2017-05-30 ENCOUNTER — Encounter: Payer: Self-pay | Admitting: Family Medicine

## 2017-05-30 ENCOUNTER — Ambulatory Visit (INDEPENDENT_AMBULATORY_CARE_PROVIDER_SITE_OTHER): Payer: 59 | Admitting: Family Medicine

## 2017-05-30 VITALS — BP 112/80 | HR 83 | Temp 98.0°F | Resp 16 | Ht 65.0 in | Wt 183.0 lb

## 2017-05-30 DIAGNOSIS — J0100 Acute maxillary sinusitis, unspecified: Secondary | ICD-10-CM

## 2017-05-30 DIAGNOSIS — G4700 Insomnia, unspecified: Secondary | ICD-10-CM

## 2017-05-30 MED ORDER — SUVOREXANT 15 MG PO TABS: 1.0000 | ORAL_TABLET | Freq: Every day | ORAL | 3 refills | Status: DC

## 2017-05-30 MED ORDER — PREDNISONE 10 MG PO TABS: ORAL_TABLET | ORAL | 0 refills | Status: DC

## 2017-05-30 NOTE — Progress Notes (Signed)
Pre visit review using our clinic review tool, if applicable. No additional management support is needed unless otherwise documented below in the visit note. 

## 2017-05-30 NOTE — Assessment & Plan Note (Signed)
Improved since starting Belsomra 10mg  but she continues to have restless sleep.  Will increase to 15mg  nightly and monitor for improvement.  Pt expressed understanding and is in agreement w/ plan.

## 2017-05-30 NOTE — Progress Notes (Signed)
   Subjective:    Patient ID: Patty Ruiz, female    DOB: [birth year], 46 y.o.   MRN: [id number]  HPI Insomnia- pt reports 'i'm sleeping and i'm waking up and feeling great but I feel tense when I sleep'.  Husband reports tossing and turning all night.  Pt is open to idea of increasing medication to improve sleep.  Sinusitis- pt is currently on Augmentin.  Pt reports continued R sided facial pain and ear pressure.  She reports PND is improved, cough is better.  Using Flonase daily.   Review of Systems For ROS see HPI     Objective:   Physical Exam  Constitutional: She appears well-developed and well-nourished. No distress.  HENT:  Head: Normocephalic and atraumatic.  Right Ear: Tympanic membrane is retracted.  Left Ear: Tympanic membrane is retracted.  Nose: Mucosal edema and rhinorrhea present. Right sinus exhibits maxillary sinus tenderness (mild) and frontal sinus tenderness (mild). Left sinus exhibits no maxillary sinus tenderness and no frontal sinus tenderness.  Mouth/Throat: Mucous membranes are normal. Posterior oropharyngeal erythema (w/ PND) present.  Eyes: Conjunctivae and EOM are normal. Pupils are equal, round, and reactive to light.  Neck: Normal range of motion. Neck supple.  Cardiovascular: Normal rate, regular rhythm and normal heart sounds.   Pulmonary/Chest: Effort normal and breath sounds normal. No respiratory distress. She has no wheezes. She has no rales.  Lymphadenopathy:    She has no cervical adenopathy.  Vitals reviewed.         Assessment & Plan:  Sinusitis- improving since starting Augmentin.  Continues to have pressure in ears bilaterally and around R eye.  Start Pred taper for sinus inflammation as infection seems to be resolving.  Pt expressed understanding and is in agreement w/ plan.

## 2017-05-30 NOTE — Patient Instructions (Addendum)
Schedule your complete physical in 6 months Increase the Belsomra to 15mg  nightly- new prescription sent Start the Prednisone as directed- take w/ food Continue daily Flonase and Claritin/Zyrtec for the allergy component Call with any questions or concerns Have a great summer!!!

## 2017-06-06 DIAGNOSIS — R0602 Shortness of breath: Secondary | ICD-10-CM | POA: Diagnosis not present

## 2017-06-06 DIAGNOSIS — R05 Cough: Secondary | ICD-10-CM | POA: Diagnosis not present

## 2017-06-06 DIAGNOSIS — R350 Frequency of micturition: Secondary | ICD-10-CM | POA: Diagnosis not present

## 2017-06-07 ENCOUNTER — Telehealth: Payer: Self-pay | Admitting: *Deleted

## 2017-06-07 NOTE — Telephone Encounter (Signed)
Left detailed message on Patient's VM  - Let her know if she's not feeling any better by 9:00 tomorrow morning to call and we can adjust medications.    Left number to call for questions/concerns/update on how she's feeling.

## 2017-06-07 NOTE — Telephone Encounter (Signed)
I would recommend that she continue care as directed by treating provider. If symptoms are not improving over the next 24 hours have her call me and I will make a change to her regimen. We can also call back tomorrow morning for her records to review as they should be completed at that time.

## 2017-06-07 NOTE — Telephone Encounter (Signed)
Did she inform the UC provider that she just finished a course of Augmentin? Which UC was this so we can get x-ray report and OV notes so I can help her further.

## 2017-06-07 NOTE — Telephone Encounter (Signed)
Patient states that she was seen at an urgent care because she was not any better from her office visit.  She said that they did a chest x-ray and told her that she has pneumonia.  They gave her 7 more days of Augmentin and patient wanted to make sure that this is the medication she should be taking, and that we wouldn't want her to switch to something else.

## 2017-06-07 NOTE — Telephone Encounter (Signed)
Patient states that she did tell them that she had just finished a round of Augmentin and was told that 7 more days should take care of it.  She was seen at [facility] on [address].   I called to see if they could get those records faxed to [place], she stated that they have not been signed off on so she can't do anything.  I asked if there was a way to make a note so that as soon as they sign off on them that they fax them to [place] - she told me no, that we would just have to call back in a few days.   Routed back to provider to advise.

## 2017-06-08 MED ORDER — AZITHROMYCIN 250 MG PO TABS: ORAL_TABLET | ORAL | 0 refills | Status: DC

## 2017-06-08 NOTE — Addendum Note (Signed)
Addended by: Brunetta Jeans on: 06/08/2017 11:57 AM   Modules accepted: Orders

## 2017-06-08 NOTE — Telephone Encounter (Signed)
I have reached out to [facility].  They took the information and said that they would fax the records to my attention.  I will be watching for them.

## 2017-06-08 NOTE — Telephone Encounter (Signed)
Patient called back - she is still not improving with the Augmentin and wondering if we can go ahead and call in something different. She is going out of town after this weekend and she is really hoping to be better before then.    Routed to provider to advise - then I will call patient back to let her know.

## 2017-06-08 NOTE — Telephone Encounter (Signed)
Patient is aware. She is appreciative of the help, and thankful for the medication.   She is aware to call back for follow-up if there is no improvement.

## 2017-06-08 NOTE — Telephone Encounter (Signed)
Can we try once more to get records [facility] record for review? I am happy to change regimen but want notes if possible.

## 2017-06-08 NOTE — Telephone Encounter (Signed)
Reviewed CXR from visit at [facility]. Since she states she is not improving would have her stop the Augmentin and start Azithromycin for CAP. I have sent in prescription to her local pharmacy. Hydrate well. If not improving needs repeat assessment.

## 2017-06-18 DIAGNOSIS — E039 Hypothyroidism, unspecified: Secondary | ICD-10-CM | POA: Diagnosis not present

## 2017-06-20 ENCOUNTER — Ambulatory Visit (INDEPENDENT_AMBULATORY_CARE_PROVIDER_SITE_OTHER): Payer: 59 | Admitting: Physician Assistant

## 2017-06-20 ENCOUNTER — Telehealth: Payer: Self-pay | Admitting: Family Medicine

## 2017-06-20 ENCOUNTER — Encounter: Payer: Self-pay | Admitting: Physician Assistant

## 2017-06-20 VITALS — BP 110/82 | HR 82 | Temp 98.2°F | Resp 14 | Ht 65.0 in | Wt 183.0 lb

## 2017-06-20 DIAGNOSIS — N390 Urinary tract infection, site not specified: Secondary | ICD-10-CM | POA: Diagnosis not present

## 2017-06-20 DIAGNOSIS — R197 Diarrhea, unspecified: Secondary | ICD-10-CM | POA: Diagnosis not present

## 2017-06-20 LAB — COMPREHENSIVE METABOLIC PANEL
ALT: 23 U/L (ref 6–29)
AST: 18 U/L (ref 10–35)
Albumin: 4.1 g/dL (ref 3.6–5.1)
Alkaline Phosphatase: 73 U/L (ref 33–115)
BILIRUBIN TOTAL: 0.3 mg/dL (ref 0.2–1.2)
BUN: 17 mg/dL (ref 7–25)
CALCIUM: 9.8 mg/dL (ref 8.6–10.2)
CO2: 20 mmol/L (ref 20–31)
CREATININE: 0.71 mg/dL (ref 0.50–1.10)
Chloride: 105 mmol/L (ref 98–110)
GLUCOSE: 90 mg/dL (ref 65–99)
Potassium: 4.4 mmol/L (ref 3.5–5.3)
SODIUM: 137 mmol/L (ref 135–146)
Total Protein: 6.9 g/dL (ref 6.1–8.1)

## 2017-06-20 LAB — CBC WITH DIFFERENTIAL/PLATELET
Basophils Absolute: 0 cells/uL (ref 0–200)
Basophils Relative: 0 %
Eosinophils Absolute: 225 cells/uL (ref 15–500)
Eosinophils Relative: 3 %
HCT: 43 % (ref 35.0–45.0)
Hemoglobin: 14.4 g/dL (ref 11.7–15.5)
LYMPHS PCT: 26 %
Lymphs Abs: 1950 cells/uL (ref 850–3900)
MCH: 28.2 pg (ref 27.0–33.0)
MCHC: 33.5 g/dL (ref 32.0–36.0)
MCV: 84.3 fL (ref 80.0–100.0)
MPV: 10.2 fL (ref 7.5–12.5)
Monocytes Absolute: 825 cells/uL (ref 200–950)
Monocytes Relative: 11 %
NEUTROS PCT: 60 %
Neutro Abs: 4500 cells/uL (ref 1500–7800)
Platelets: 350 10*3/uL (ref 140–400)
RBC: 5.1 MIL/uL (ref 3.80–5.10)
RDW: 13.8 % (ref 11.0–15.0)
WBC: 7.5 10*3/uL (ref 3.8–10.8)

## 2017-06-20 LAB — POCT URINALYSIS DIPSTICK
BILIRUBIN UA: NEGATIVE
GLUCOSE UA: NEGATIVE
Ketones, UA: NEGATIVE
NITRITE UA: NEGATIVE
Protein, UA: NEGATIVE
Spec Grav, UA: >=1.03 — AB (ref 1.010–1.025)
Urobilinogen, UA: 0.2 E.U./dL
pH, UA: 5 (ref 5.0–8.0)

## 2017-06-20 NOTE — Telephone Encounter (Signed)
Noted  

## 2017-06-20 NOTE — Telephone Encounter (Signed)
Patient Name: Patty Ruiz  DOB: 05-05-1971    Initial Comment Caller states was on abx past 2 wks for sinus infection and pneumonia; mucus and blood in stool this week; some abd pain;    Nurse Assessment  Nurse: Raphael Gibney, RN, Vera Date/Time (Eastern Time): 06/20/2017 9:00:27 AM  Confirm and document reason for call. If symptomatic, describe symptoms. ---Caller states she took antibiotics for a sinus infection and finished antibiotics last Thursday. Had diarrhea for a week. Stools have a lot of mucus and has specks of blood. Has abd pain which is not unusual. No cramping. No fever. No vomiting. Has had 5-6 diarrhea stools a day.  Does the patient have any new or worsening symptoms? ---Yes  Will a triage be completed? ---Yes  Related visit to physician within the last 2 weeks? ---No  Does the PT have any chronic conditions? (i.e. diabetes, asthma, etc.) ---Yes  List chronic conditions. ---thyroid problems  Is the patient pregnant or possibly pregnant? (Ask all females between the ages of 41-55) ---No  Is this a behavioral health or substance abuse call? ---No     Guidelines    Guideline Title Affirmed Question Affirmed Notes  Diarrhea [1] MODERATE diarrhea (e.g., 4-6 times / day more than normal) AND [2] present > 48 hours (2 days)    Final Disposition User   See Physician within 24 Hours Powell, RN, Vera    Comments  appt scheduled for 06/20/2017 at 2:45 pm with Elyn Aquas   Referrals  REFERRED TO PCP OFFICE   Disagree/Comply: Leta Baptist

## 2017-06-20 NOTE — Progress Notes (Signed)
Patient presents to clinic today c/o 4 days of mucous in the stool with some bright blood noted when wiping today. Noted last week having diarrhea and abdominal cramping that have since have resolved. Endorses some decreased appetite. Denies nausea or vomiting. Denies melena or tenesmus. Denies dysuria, urinary urgency or hesitancy. Notes some mild suprapubic pressure. Patient was recently on antibiotic for both a UTI and sinus infection. Is also wanting reassessment of urine for any residual infection.  Past Medical History:  Diagnosis Date  . Dysmenorrhea   . Endometriosis   . Family history of breast cancer   . Gluten intolerance   . 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 Hyperlipidemia   . Hypothyroidism   . Menorrhagia   . PCOD (polycystic ovarian disease)   . PONV (postoperative nausea and vomiting)   . 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 acetaminophen (TYLENOL) 500 MG tablet Take 500 mg by mouth every 6 (six) hours as needed for mild pain or moderate pain.    Marland Kitchen aspirin-acetaminophen-caffeine (EXCEDRIN MIGRAINE) 250-250-65 MG tablet Take 1 tablet by mouth every 6 (six) hours as needed for headache.    Marland Kitchen buPROPion (WELLBUTRIN XL) 150 MG 24 hr tablet Take 150 mg by mouth daily.    . DULoxetine (CYMBALTA) 20 MG capsule Take 20 mg by mouth daily.    Marland Kitchen EPIPEN 2-PAK 0.3 MG/0.3ML SOAJ injection See admin instructions.  0  . fluticasone (FLONASE) 50 MCG/ACT nasal spray Place 2 sprays into both nostrils daily. 16 g 1  . Multiple Vitamins-Minerals (MULTIVITAMIN WITH MINERALS) tablet Take 1 tablet by mouth daily.    . Suvorexant (BELSOMRA) 15 MG TABS Take 1 tablet by mouth at bedtime. 30 tablet 3  . thyroid (ARMOUR) 60 MG tablet Take 60 mg by mouth daily before breakfast. Pt takes 180m  total    . thyroid (ARMOUR) 90 MG tablet Take 90 mg by mouth daily. Pt takes 1573mtotal     No current facility-administered medications on file prior to visit.     Allergies  Allergen Reactions  . Ciprofloxacin Anaphylaxis, Shortness Of Breath and Swelling  . Codeine Shortness Of Breath and Swelling  . Morphine And Related Shortness Of Breath, Swelling and Other (See Comments)    Severe abdominal cramps, causes what appears to be pancreatitis.  . Shellfish Allergy Anaphylaxis    Shrimp, lobster, crab  . Other     GLUTEN INTOLERENCE  . Adhesive [Tape] Rash    Looks like poison oak where applied    Family History  Problem Relation Age of Onset  . Cancer Father [id number]     melanoma - retinal  . Heart disease Maternal Aunt   . Heart disease Maternal Uncle   . Cancer Paternal Aunt 5367     breast cancer- reportedly BRCA+ but report not available at present time  . Cancer Paternal Grandfather 7543     prostate  . Heart attack Paternal Grandfather   . Cancer Other        reportedly has 2nd cousin on maternal side that is also BRCA+ but no report available  . Heart disease Maternal Aunt   . Heart disease Maternal Aunt     Social History   Social History  .  Marital status: Married    Spouse name: N/A  . Number of children: N/A  . Years of education: N/A   Social History Main Topics  . Smoking status: Never Smoker  . Smokeless tobacco: Never Used  . Alcohol use No  . Drug use: No  . Sexual activity: Yes   Other Topics Concern  . None   Social History Narrative  . None   Review of Systems - See HPI.  All other ROS are negative.  BP 110/82   Pulse 82   Temp 98.2 F (36.8 C) (Oral)   Resp 14   Ht '5\' 5"'  (1.651 m)   Wt 183 lb (83 kg)   LMP 02/18/2015 (Exact Date)   SpO2 97%   BMI 30.45 kg/m   Physical Exam  Constitutional: She is oriented to person, place, and time and well-developed, well-nourished, and in no distress.  HENT:  Head: Normocephalic and  atraumatic.  Eyes: Conjunctivae are normal.  Neck: Neck supple.  Cardiovascular: Normal rate, regular rhythm, normal heart sounds and intact distal pulses.   Pulmonary/Chest: Effort normal and breath sounds normal. No respiratory distress. She has no wheezes. She has no rales. She exhibits no tenderness.  Abdominal: Soft. Normal appearance. Bowel sounds are hyperactive. There is generalized tenderness. There is no rigidity and no guarding.  Genitourinary:  Genitourinary Comments: Patient declines DRE.  Neurological: She is alert and oriented to person, place, and time.  Skin: Skin is warm and dry. No rash noted.  Psychiatric: Affect normal.  Vitals reviewed.  Assessment/Plan: 1. Diarrhea, unspecified type Enteritis. Resolving but some mucous noted in stool. Supportive measures reviewed. Start [employer] and probiotic. Will check labs today and obtain stool studies to include c. Diff testing giving recent ABX. - CBC w/Diff - Comp Met (CMET)  2. Urinary tract infection without hematuria, site unspecified Recent infection s/p treatment. Urine culture to confirm resolution. - POCT urinalysis dipstick - Urine Culture   Leeanne Rio, PA-C

## 2017-06-20 NOTE — Progress Notes (Signed)
Pre visit review using our clinic review tool, if applicable. No additional management support is needed unless otherwise documented below in the visit note. 

## 2017-06-20 NOTE — Patient Instructions (Signed)
Please go to the lab for blood work. I will call you with your results. Also, bring in the stool kit so we can test. Stay well hydrated. Follow the diet below. Start a daily probiotic.  We will later regimen based on results.  Call me if you note any new or worsening symptoms.    Bland Diet A bland diet consists of foods that do not have a lot of fat or fiber. Foods without fat or fiber are easier for the body to digest. They are also less likely to irritate your mouth, throat, stomach, and other parts of your gastrointestinal tract. A bland diet is sometimes called a BRAT diet. What is my plan? Your health care provider or dietitian may recommend specific changes to your diet to prevent and treat your symptoms, such as:  Eating small meals often.  Cooking food until it is soft enough to chew easily.  Chewing your food well.  Drinking fluids slowly.  Not eating foods that are very spicy, sour, or fatty.  Not eating citrus fruits, such as oranges and grapefruit.  What do I need to know about this diet?  Eat a variety of foods from the bland diet food list.  Do not follow a bland diet longer than you have to.  Ask your health care provider whether you should take vitamins. What foods can I eat? Grains  Hot cereals, such as cream of wheat. Bread, crackers, or tortillas made from refined white flour. Rice. Vegetables Canned or cooked vegetables. Mashed or boiled potatoes. Fruits Bananas. Applesauce. Other types of cooked or canned fruit with the skin and seeds removed, such as canned peaches or pears. Meats and Other Protein Sources Scrambled eggs. Creamy peanut butter or other nut butters. Lean, well-cooked meats, such as chicken or fish. Tofu. Soups or broths. Dairy Low-fat dairy products, such as milk, cottage cheese, or yogurt. Beverages Water. Herbal tea. Apple juice. Sweets and Desserts Pudding. Custard. Fruit gelatin. Ice cream. Fats and Oils Mild salad dressings.  Canola or olive oil. The items listed above may not be a complete list of allowed foods or beverages. Contact your dietitian for more options. What foods are not recommended? Foods and ingredients that are often not recommended include:  Spicy foods, such as hot sauce or salsa.  Fried foods.  Sour foods, such as pickled or fermented foods.  Raw vegetables or fruits, especially citrus or berries.  Caffeinated drinks.  Alcohol.  Strongly flavored seasonings or condiments.  The items listed above may not be a complete list of foods and beverages that are not allowed. Contact your dietitian for more information. This information is not intended to replace advice given to you by your health care provider. Make sure you discuss any questions you have with your health care provider. Document Released: 03/13/2016 Document Revised: 04/27/2016 Document Reviewed: 12/02/2014 Elsevier Interactive Patient Education  2018 Elsevier Inc.  

## 2017-06-20 NOTE — Telephone Encounter (Signed)
FYI, pt scheduled with you today.

## 2017-06-21 LAB — URINE CULTURE

## 2017-06-22 DIAGNOSIS — R197 Diarrhea, unspecified: Secondary | ICD-10-CM | POA: Diagnosis not present

## 2017-06-23 ENCOUNTER — Telehealth: Payer: Self-pay | Admitting: Family Medicine

## 2017-06-23 LAB — CLOSTRIDIUM DIFFICILE BY PCR: Toxigenic C. Difficile by PCR: DETECTED — CR

## 2017-06-23 NOTE — Telephone Encounter (Signed)
Tried calling again at 8:00 pm  No answer

## 2017-06-23 NOTE — Telephone Encounter (Signed)
Received call from [facility] -re: positive c diff test   Trying to reach pt to send in abx and check on her  No answer times 2 for mobile and left her a message   I will continue to try to reach her through the weekend  Unsure which of 2 pharmacies she is using

## 2017-06-24 MED ORDER — METRONIDAZOLE 500 MG PO TABS: 500.0000 mg | ORAL_TABLET | Freq: Three times a day (TID) | ORAL | 0 refills | Status: DC

## 2017-06-24 NOTE — Telephone Encounter (Signed)
[facility] called-pt is in [place] traveling and finally go a hold of her  Sending flagyl for her c diff   Will cc her treating provider

## 2017-06-25 LAB — OVA AND PARASITE EXAMINATION: OP: NONE SEEN

## 2017-06-25 NOTE — Telephone Encounter (Signed)
Noted! Thank you

## 2017-06-26 LAB — STOOL CULTURE

## 2017-07-30 DIAGNOSIS — M419 Scoliosis, unspecified: Secondary | ICD-10-CM | POA: Diagnosis not present

## 2017-07-30 DIAGNOSIS — M4316 Spondylolisthesis, lumbar region: Secondary | ICD-10-CM | POA: Diagnosis not present

## 2017-07-30 DIAGNOSIS — M5136 Other intervertebral disc degeneration, lumbar region: Secondary | ICD-10-CM | POA: Diagnosis not present

## 2017-08-14 DIAGNOSIS — T162XXA Foreign body in left ear, initial encounter: Secondary | ICD-10-CM | POA: Diagnosis not present

## 2017-08-14 DIAGNOSIS — K219 Gastro-esophageal reflux disease without esophagitis: Secondary | ICD-10-CM | POA: Diagnosis not present

## 2017-09-01 DIAGNOSIS — M5032 Other cervical disc degeneration, mid-cervical region, unspecified level: Secondary | ICD-10-CM | POA: Diagnosis not present

## 2017-09-01 DIAGNOSIS — M9901 Segmental and somatic dysfunction of cervical region: Secondary | ICD-10-CM | POA: Diagnosis not present

## 2017-09-01 DIAGNOSIS — M531 Cervicobrachial syndrome: Secondary | ICD-10-CM | POA: Diagnosis not present

## 2017-09-19 DIAGNOSIS — M531 Cervicobrachial syndrome: Secondary | ICD-10-CM | POA: Diagnosis not present

## 2017-09-19 DIAGNOSIS — M5032 Other cervical disc degeneration, mid-cervical region, unspecified level: Secondary | ICD-10-CM | POA: Diagnosis not present

## 2017-09-19 DIAGNOSIS — M9901 Segmental and somatic dysfunction of cervical region: Secondary | ICD-10-CM | POA: Diagnosis not present

## 2017-09-26 DIAGNOSIS — M4316 Spondylolisthesis, lumbar region: Secondary | ICD-10-CM | POA: Diagnosis not present

## 2017-09-27 DIAGNOSIS — M48061 Spinal stenosis, lumbar region without neurogenic claudication: Secondary | ICD-10-CM | POA: Diagnosis not present

## 2017-09-27 DIAGNOSIS — Z79899 Other long term (current) drug therapy: Secondary | ICD-10-CM | POA: Diagnosis not present

## 2017-09-27 DIAGNOSIS — Z981 Arthrodesis status: Secondary | ICD-10-CM | POA: Diagnosis not present

## 2017-09-27 DIAGNOSIS — R1084 Generalized abdominal pain: Secondary | ICD-10-CM | POA: Diagnosis not present

## 2017-09-27 DIAGNOSIS — G8918 Other acute postprocedural pain: Secondary | ICD-10-CM | POA: Diagnosis not present

## 2017-09-27 DIAGNOSIS — M4317 Spondylolisthesis, lumbosacral region: Secondary | ICD-10-CM | POA: Diagnosis not present

## 2017-09-27 DIAGNOSIS — M549 Dorsalgia, unspecified: Secondary | ICD-10-CM | POA: Diagnosis not present

## 2017-09-27 DIAGNOSIS — M5137 Other intervertebral disc degeneration, lumbosacral region: Secondary | ICD-10-CM | POA: Diagnosis not present

## 2017-09-27 DIAGNOSIS — M4316 Spondylolisthesis, lumbar region: Secondary | ICD-10-CM | POA: Diagnosis not present

## 2017-09-27 HISTORY — PX: ANTERIOR LAT LUMBAR FUSION: SHX1168

## 2017-09-30 DIAGNOSIS — T8489XD Other specified complication of internal orthopedic prosthetic devices, implants and grafts, subsequent encounter: Secondary | ICD-10-CM | POA: Diagnosis not present

## 2017-09-30 DIAGNOSIS — T84296A Other mechanical complication of internal fixation device of vertebrae, initial encounter: Secondary | ICD-10-CM | POA: Diagnosis not present

## 2017-09-30 HISTORY — PX: LUMBAR SPINE SURGERY: SHX701

## 2017-10-03 ENCOUNTER — Other Ambulatory Visit: Payer: Self-pay | Admitting: Family Medicine

## 2017-10-03 NOTE — Telephone Encounter (Signed)
Medication filled to pharmacy as requested.   

## 2017-10-03 NOTE — Telephone Encounter (Signed)
Last OV 06/20/17 belsomra last filled 05/30/17 #30 with 3

## 2017-10-23 ENCOUNTER — Other Ambulatory Visit: Payer: Self-pay | Admitting: Obstetrics and Gynecology

## 2017-10-23 DIAGNOSIS — Z1231 Encounter for screening mammogram for malignant neoplasm of breast: Secondary | ICD-10-CM

## 2017-11-08 ENCOUNTER — Other Ambulatory Visit: Payer: Self-pay | Admitting: Family Medicine

## 2017-11-08 ENCOUNTER — Telehealth: Payer: Self-pay | Admitting: Family Medicine

## 2017-11-08 NOTE — Telephone Encounter (Signed)
Copied from [place]. Topic: Quick Communication - See Telephone Encounter >> Nov 08, 2017 10:05 AM Boyd Kerbs wrote: CRM for notification. See Telephone encounter for:  Prescription refill Belsomra 15mg  needed. [facility] on [address]  [facility]armacy sent escript last week.  Pt. Has appt 12/28 for medication check.  11/08/17.

## 2017-11-09 NOTE — Telephone Encounter (Signed)
Refilled at 8:40am this morning

## 2017-11-09 NOTE — Telephone Encounter (Signed)
Pt. Requesting Belsomra. Last OV 06/20/17. Next visit 11/30/17. Thanks.

## 2017-11-09 NOTE — Telephone Encounter (Signed)
Medication filled to pharmacy as requested.   

## 2017-11-09 NOTE — Telephone Encounter (Signed)
Last OV 06/20/17 belsomra last filled 10/03/17 #30 with 0

## 2017-11-09 NOTE — Telephone Encounter (Signed)
Patient notified of PCP recommendations and is agreement and expresses an understanding.   Ok for [facility] to Discuss results / PCP recommendations / Schedule patient.   

## 2017-11-16 ENCOUNTER — Other Ambulatory Visit: Payer: Self-pay | Admitting: Neurosurgery

## 2017-11-16 ENCOUNTER — Ambulatory Visit
Admission: RE | Admit: 2017-11-16 | Discharge: 2017-11-16 | Disposition: A | Discharge status: Home / Self Care | Payer: 59 | Source: Ambulatory Visit | Attending: Neurosurgery | Admitting: Neurosurgery

## 2017-11-16 DIAGNOSIS — M4316 Spondylolisthesis, lumbar region: Secondary | ICD-10-CM

## 2017-11-16 DIAGNOSIS — M4326 Fusion of spine, lumbar region: Secondary | ICD-10-CM | POA: Diagnosis not present

## 2017-11-30 ENCOUNTER — Ambulatory Visit: Payer: 59 | Admitting: Family Medicine

## 2017-12-03 ENCOUNTER — Ambulatory Visit: Payer: 59

## 2017-12-05 ENCOUNTER — Ambulatory Visit: Payer: 59 | Admitting: Family Medicine

## 2017-12-05 DIAGNOSIS — Z0289 Encounter for other administrative examinations: Secondary | ICD-10-CM

## 2017-12-06 ENCOUNTER — Ambulatory Visit
Admission: RE | Admit: 2017-12-06 | Discharge: 2017-12-06 | Disposition: A | Discharge status: Home / Self Care | Payer: 59 | Source: Ambulatory Visit | Attending: Obstetrics and Gynecology | Admitting: Obstetrics and Gynecology

## 2017-12-06 DIAGNOSIS — Z1231 Encounter for screening mammogram for malignant neoplasm of breast: Secondary | ICD-10-CM

## 2017-12-07 ENCOUNTER — Other Ambulatory Visit: Payer: Self-pay | Admitting: Obstetrics and Gynecology

## 2017-12-07 DIAGNOSIS — R928 Other abnormal and inconclusive findings on diagnostic imaging of breast: Secondary | ICD-10-CM

## 2017-12-12 ENCOUNTER — Ambulatory Visit
Admission: RE | Admit: 2017-12-12 | Discharge: 2017-12-12 | Disposition: A | Discharge status: Home / Self Care | Payer: 59 | Source: Ambulatory Visit | Attending: Obstetrics and Gynecology | Admitting: Obstetrics and Gynecology

## 2017-12-12 ENCOUNTER — Ambulatory Visit
Admission: RE | Admit: 2017-12-12 | Discharge: 2017-12-12 | Disposition: A | Discharge status: Home / Self Care | Payer: Commercial Managed Care - PPO | Source: Ambulatory Visit | Attending: Obstetrics and Gynecology | Admitting: Obstetrics and Gynecology

## 2017-12-12 ENCOUNTER — Other Ambulatory Visit: Payer: 59

## 2017-12-12 DIAGNOSIS — R928 Other abnormal and inconclusive findings on diagnostic imaging of breast: Secondary | ICD-10-CM

## 2018-01-01 ENCOUNTER — Telehealth: Payer: Self-pay | Admitting: Family Medicine

## 2018-01-01 NOTE — Telephone Encounter (Signed)
I am not sure where pt would have left a message as we use the [facility].  Will defer to office management.

## 2018-01-01 NOTE — Telephone Encounter (Signed)
Pt called in because she received a no show fee for DOS 12/05/17, pt says that she left a vm with office to cancel apt. Pt would like to know if she is able to have fee waived ?    Please advise.

## 2018-01-03 NOTE — Telephone Encounter (Signed)
Patty Ruiz- please send email to charge correction to waive ns fee. Thank ou!

## 2018-07-18 IMAGING — MG 2D DIGITAL SCREENING BILATERAL MAMMOGRAM WITH 3D TOMO WITH CAD
9 of 12 series · 9 of 28 positions shown · non-contrast
Comparison: Previous exam(s).

CLINICAL DATA: Screening.

EXAM:
2D DIGITAL SCREENING BILATERAL MAMMOGRAM WITH 3D TOMO WITH CAD

[L CC]
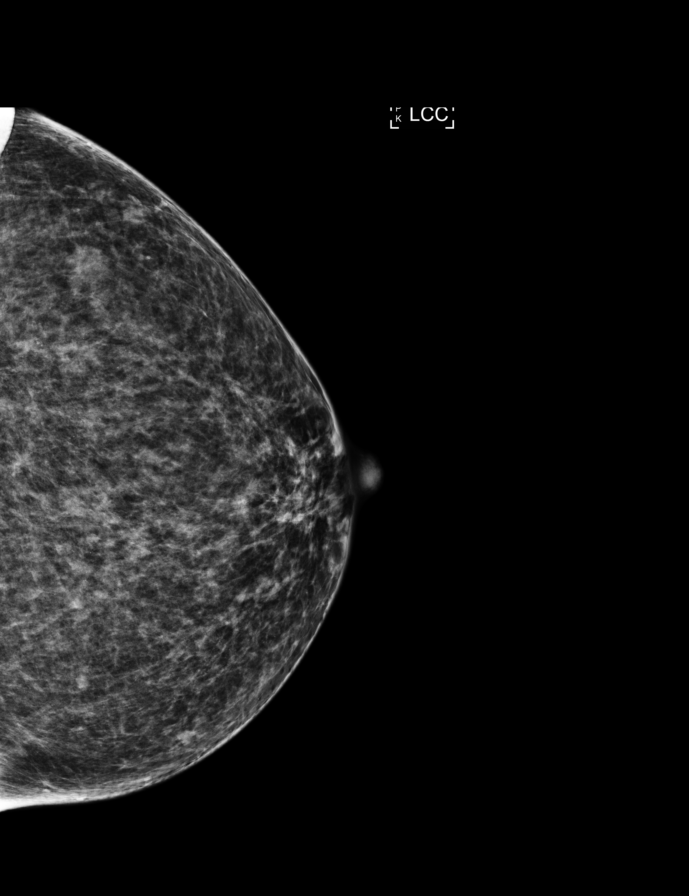

[R CC]
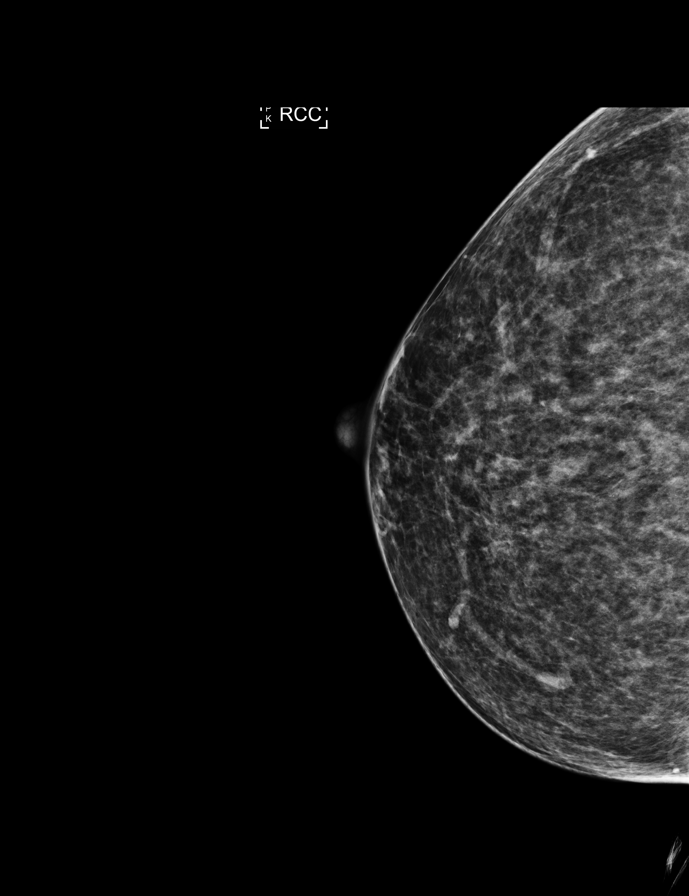

[L MLO synth-2D]
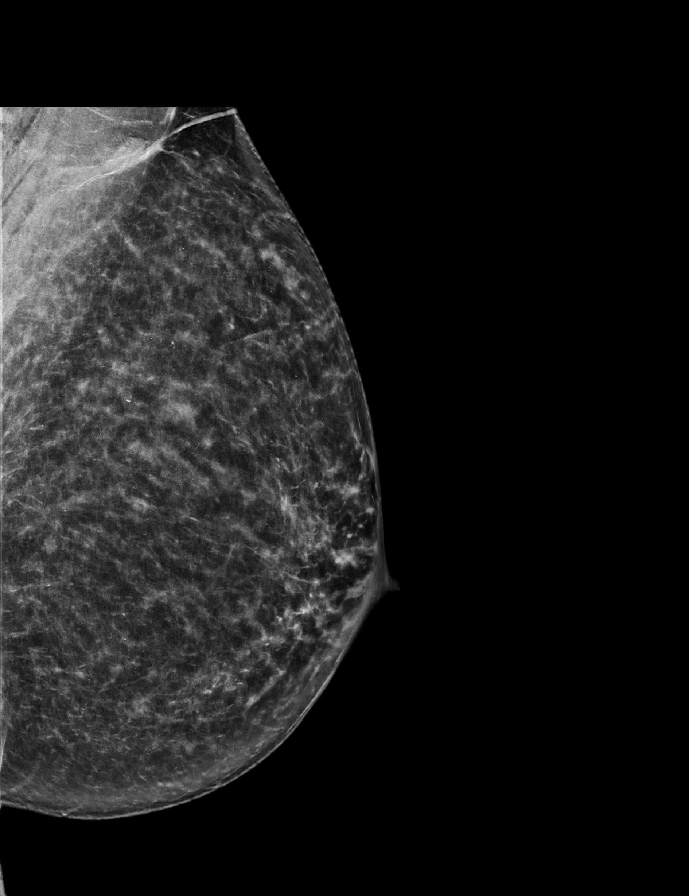

[L MLO]
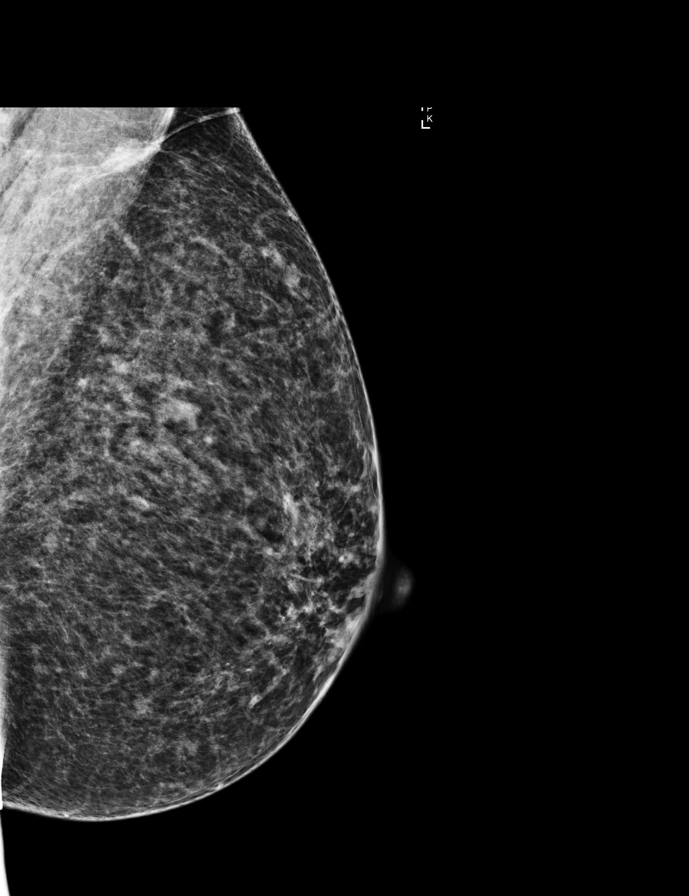

[L CC synth-2D]
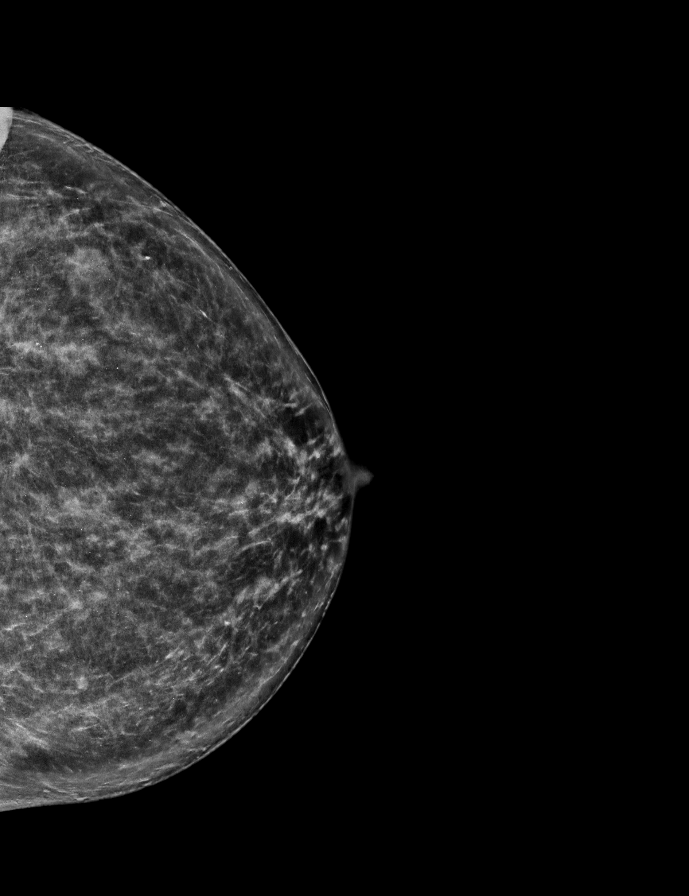

[R MLO]
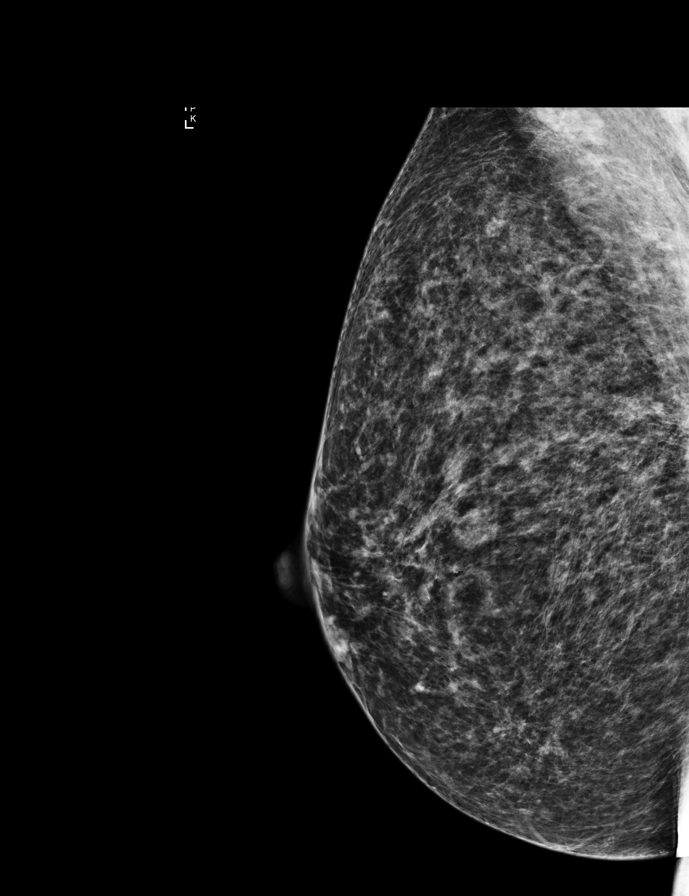

[R MLO synth-2D]
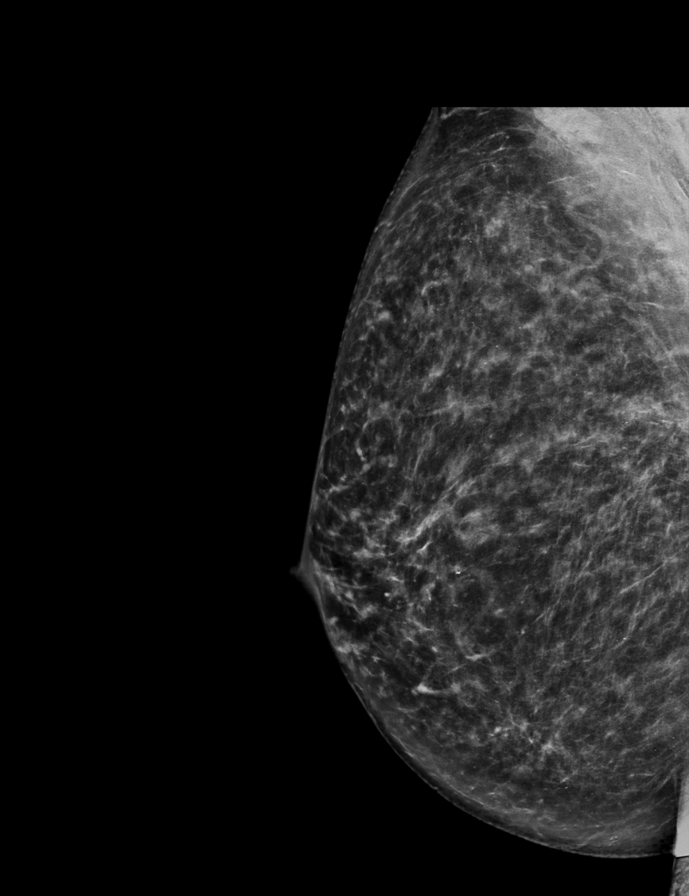

[R CC synth-2D]
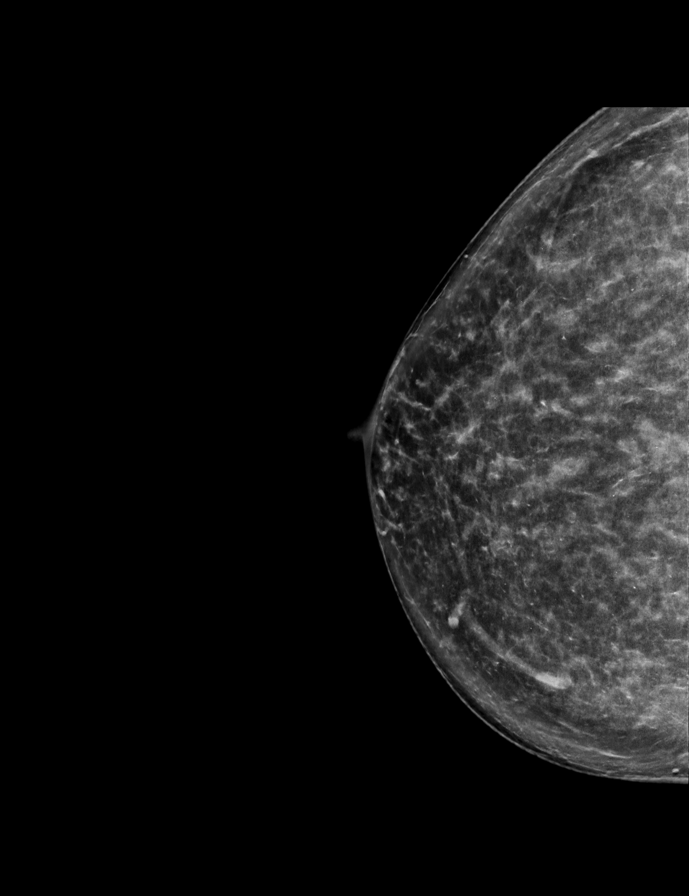

[R CC tomo · tomo slice 40/79.0]
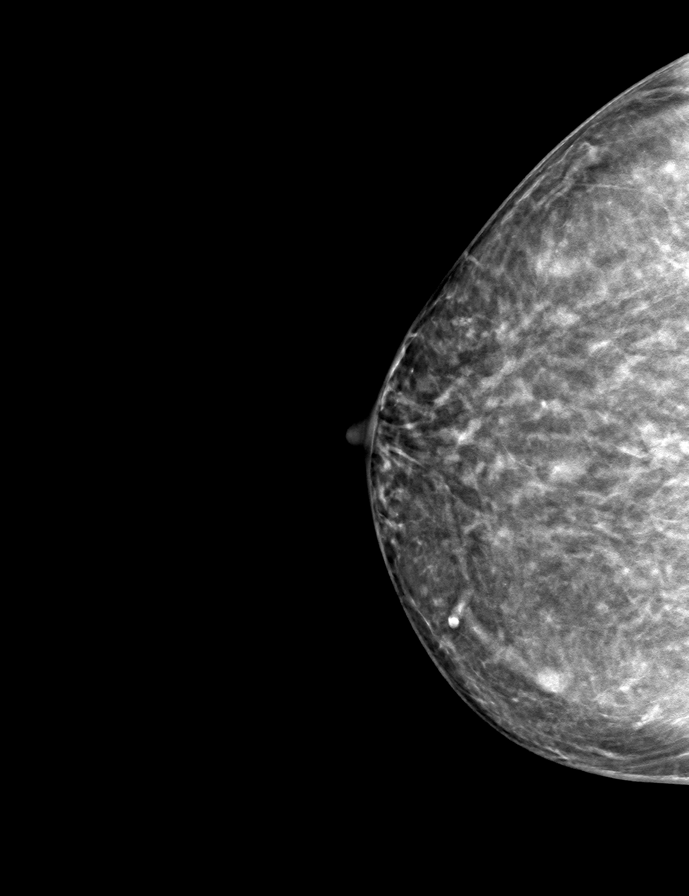

[9 of 28 positions shown; findings below may reference images not displayed]

ACR Breast Density Category c: The breast tissue is heterogeneously
dense, which may obscure small masses.
FINDINGS: In the left breast, a possible mass warrants further evaluation.
This possible mass is seen within the outer left breast, at
posterior depth, tomosynthesis CC slice 53, MLO slice 58.

In the right breast, no findings suspicious for malignancy.

Images were processed with CAD.
IMPRESSION: Further evaluation is suggested for possible mass in the left
breast.

RECOMMENDATION:
Diagnostic mammogram and possibly ultrasound of the left breast.
(Code:[id number])

The patient will be contacted regarding the findings, and additional
imaging will be scheduled.

BI-RADS CATEGORY  0: Incomplete. Need additional imaging evaluation
and/or prior mammograms for comparison.

## 2018-10-07 ENCOUNTER — Ambulatory Visit (INDEPENDENT_AMBULATORY_CARE_PROVIDER_SITE_OTHER): Payer: Commercial Managed Care - PPO | Admitting: Physician Assistant

## 2018-10-07 ENCOUNTER — Encounter: Payer: Self-pay | Admitting: Physician Assistant

## 2018-10-07 ENCOUNTER — Other Ambulatory Visit: Payer: Self-pay

## 2018-10-07 ENCOUNTER — Ambulatory Visit: Payer: Commercial Managed Care - PPO | Admitting: Physician Assistant

## 2018-10-07 VITALS — BP 116/80 | HR 77 | Temp 98.6°F | Resp 16 | Ht 65.0 in | Wt 181.0 lb

## 2018-10-07 DIAGNOSIS — R197 Diarrhea, unspecified: Secondary | ICD-10-CM | POA: Diagnosis not present

## 2018-10-07 DIAGNOSIS — J209 Acute bronchitis, unspecified: Secondary | ICD-10-CM | POA: Diagnosis not present

## 2018-10-07 MED ORDER — BENZONATATE 100 MG PO CAPS: 100.0000 mg | ORAL_CAPSULE | Freq: Two times a day (BID) | ORAL | 0 refills | Status: DC | PRN

## 2018-10-07 NOTE — Patient Instructions (Signed)
Please keep well-hydrated and get plenty of rest. Ibuprofen or Tylenol for the soreness from coughing. Start the prescription tessalon for cough.   We will hold off on antibiotic until you can turn in your stool sample to rule out c diff. Please keep the probiotic in your system.

## 2018-10-07 NOTE — Progress Notes (Signed)
Patient presents to clinic today c/o > 1 week of worsening chest congestion and cough that is productive of thick yellow sputum. Has noted some nasal drainage but the chest congestion is more significant. Denies fever, chills. Notes chest/rib tenderness. Denies recent travel or sick contact. Has been taking Mucinex and steam baths. Patient says that her daughter is currently being treated for c. Diff with vancomycin. Patient notes she has had quite a bit of loose stools over the past 2 weeks and is concerned she may have c. Diff as well.   Past Medical History:  Diagnosis Date  . Dysmenorrhea   . Endometriosis   . Family history of breast cancer   . Gluten intolerance   . 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 Hyperlipidemia   . Hypothyroidism   . Menorrhagia   . PCOD (polycystic ovarian disease)   . PONV (postoperative nausea and vomiting)   . 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 aspirin-acetaminophen-caffeine (EXCEDRIN MIGRAINE) 250-250-65 MG tablet Take 1 tablet by mouth every 6 (six) hours as needed for headache.    . EPIPEN 2-PAK 0.3 MG/0.3ML SOAJ injection See admin instructions.  0  . Multiple Vitamins-Minerals (MULTIVITAMIN WITH MINERALS) tablet Take 1 tablet by mouth daily.    . pregabalin (LYRICA) 25 MG capsule Take 25 mg by mouth daily.    Marland Kitchen thyroid (NP THYROID) 60 MG tablet Take 60 mg by mouth daily before breakfast.    . thyroid (NP THYROID) 90 MG tablet Take 1 tablet by mouth daily.    . traZODone (DESYREL) 50 MG tablet Take 50 mg by mouth at bedtime.     No current facility-administered medications on file prior to visit.     Allergies  Allergen Reactions  . Ciprofloxacin Anaphylaxis, Shortness Of Breath and Swelling  . Codeine Shortness Of Breath and Swelling  .  Morphine And Related Shortness Of Breath, Swelling and Other (See Comments)    Severe abdominal cramps, causes what appears to be pancreatitis.  . Shellfish Allergy Anaphylaxis    Shrimp, lobster, crab  . Other     GLUTEN INTOLERENCE  . Adhesive [Tape] Rash    Looks like poison oak where applied    Family History  Problem Relation Age of Onset  . Cancer Father 30       melanoma - retinal  . Heart disease Maternal Aunt   . Heart disease Maternal Uncle   . Cancer Paternal Aunt 38       breast cancer- reportedly BRCA+ but report not available at present time  . Cancer Paternal Grandfather 15       prostate  . Heart attack Paternal Grandfather   . Cancer Other        reportedly has 2nd cousin on maternal side that is also BRCA+ but no report available  . Heart disease Maternal Aunt   . Heart disease Maternal Aunt     Social History   Socioeconomic History  . Marital status: Married    Spouse name: Not on file  . Number of children: Not on file  . Years of education: Not on file  . Highest education level: Not on file  Occupational History  . Not on file  Social Needs  . Financial resource strain: Not  on file  . Food insecurity:    Worry: Not on file    Inability: Not on file  . Transportation needs:    Medical: Not on file    Non-medical: Not on file  Tobacco Use  . Smoking status: Never Smoker  . Smokeless tobacco: Never Used  Substance and Sexual Activity  . Alcohol use: No  . Drug use: No  . Sexual activity: Yes  Lifestyle  . Physical activity:    Days per week: Not on file    Minutes per session: Not on file  . Stress: Not on file  Relationships  . Social connections:    Talks on phone: Not on file    Gets together: Not on file    Attends religious service: Not on file    Active member of club or organization: Not on file    Attends meetings of clubs or organizations: Not on file    Relationship status: Not on file  Other Topics Concern  . Not on file   Social History Narrative  . Not on file   Review of Systems - See HPI.  All other ROS are negative.  BP 116/80   Pulse 77   Temp 98.6 F (37 C) (Oral)   Resp 16   Ht '5\' 5"'$  (1.651 m)   Wt 181 lb (82.1 kg)   LMP 02/18/2015 (Exact Date)   SpO2 98%   BMI 30.12 kg/m   Physical Exam  Constitutional: She is oriented to person, place, and time. She appears well-developed and well-nourished.  HENT:  Head: Normocephalic and atraumatic.  Eyes: Conjunctivae are normal.  Neck: Neck supple.  Cardiovascular: Normal rate, regular rhythm, normal heart sounds and intact distal pulses.  Pulmonary/Chest: Effort normal and breath sounds normal. No stridor. No respiratory distress. She has no wheezes. She has no rales. She exhibits no tenderness.  Abdominal: Soft. Bowel sounds are normal. She exhibits no distension and no mass. There is generalized tenderness. There is no rebound and no guarding. No hernia.  Neurological: She is alert and oriented to person, place, and time.  Psychiatric: She has a normal mood and affect.  Vitals reviewed.  Assessment/Plan: 1. Diarrhea, unspecified type Notes some loose stool x 2 weeks. Denies significant increase in frequency. Has been improving with probiotics. Will check C. Diff stool assay today.  - Clostridium difficile Toxin B, Qualitative, Real-Time PCR([facility])  2. Acute bronchitis, unspecified organism Holding off on ABX while checking for c. Diff. Increase fluids. Rest. Start Tessalon and Mucinex for cough. Tylenol for tenderness from coughing. She is to monitor symptoms closely. Will start ABX if testing negative for c diff and symptoms are not easing up.   - benzonatate (TESSALON) 100 MG capsule; Take 1 capsule (100 mg total) by mouth 2 (two) times daily as needed for cough.  Dispense: 20 capsule; Refill: 0   Leeanne Rio, PA-C

## 2018-10-09 ENCOUNTER — Other Ambulatory Visit: Payer: Self-pay | Admitting: Physician Assistant

## 2018-10-09 LAB — CLOSTRIDIUM DIFFICILE TOXIN B, QUALITATIVE, REAL-TIME PCR: CDIFFPCR: NOT DETECTED

## 2018-10-09 MED ORDER — AZITHROMYCIN 250 MG PO TABS: ORAL_TABLET | ORAL | 0 refills | Status: DC

## 2018-10-22 ENCOUNTER — Other Ambulatory Visit: Payer: Self-pay

## 2018-10-22 ENCOUNTER — Ambulatory Visit (INDEPENDENT_AMBULATORY_CARE_PROVIDER_SITE_OTHER): Payer: Commercial Managed Care - PPO | Admitting: Physician Assistant

## 2018-10-22 ENCOUNTER — Encounter: Payer: Self-pay | Admitting: Physician Assistant

## 2018-10-22 VITALS — BP 120/80 | HR 87 | Temp 98.3°F | Resp 14 | Ht 65.0 in | Wt 182.0 lb

## 2018-10-22 DIAGNOSIS — R300 Dysuria: Secondary | ICD-10-CM | POA: Diagnosis not present

## 2018-10-22 DIAGNOSIS — R102 Pelvic and perineal pain: Secondary | ICD-10-CM

## 2018-10-22 LAB — POCT URINALYSIS DIPSTICK
Bilirubin, UA: NEGATIVE
Glucose, UA: NEGATIVE
Ketones, UA: NEGATIVE
Leukocytes, UA: NEGATIVE
NITRITE UA: NEGATIVE
PH UA: 5.5 (ref 5.0–8.0)
PROTEIN UA: NEGATIVE
Spec Grav, UA: >=1.03 — AB (ref 1.010–1.025)
UROBILINOGEN UA: 0.2 U/dL

## 2018-10-22 MED ORDER — CYCLOBENZAPRINE HCL 5 MG PO TABS: 5.0000 mg | ORAL_TABLET | Freq: Three times a day (TID) | ORAL | 1 refills | Status: DC | PRN

## 2018-10-22 NOTE — Progress Notes (Signed)
Acute Office Visit  Subjective:    Patient ID: Patty Ruiz, female    DOB: 1971-05-06, 47 y.o.   MRN: [id number]  Chief Complaint  Patient presents with  . Dysuria   HPI Patient is in today for complaints of pelvic pressure and discomfort over the past few days. Denies dysuria but notes significant pressure with urination. Denies hematuria, nausea or vomiting. Has hx of nephrolithiasis but states this feels markedly different. Notes some vaginal pressure. Is s/p hysterectomy.    Past Medical History:  Diagnosis Date  . Dysmenorrhea   . Endometriosis   . Family history of breast cancer   . Gluten intolerance   . 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 Hyperlipidemia   . Hypothyroidism   . Menorrhagia   . PCOD (polycystic ovarian disease)   . PONV (postoperative nausea and vomiting)   . 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 ANTERIOR CERVICAL DECOMP/DISCECTOMY FUSION  2000   C4 -- C5  . BILATERAL SALPINGECTOMY Bilateral 03/15/2015   Procedure: BILATERAL SALPINGECTOMY;  Surgeon: Marylynn Pearson, MD;  Location[facility];  Service: Gynecology;  Laterality: Bilateral;  . BREAST EXCISIONAL BIOPSY Left 2017   benign  . BREAST EXCISIONAL BIOPSY Left 2008   benign  . CHOLECYSTECTOMY  2004  ([place], [place])  . DX LAPAROSOCPY FOR PELVIC PAIN/   REPAIR UMBILICAL HERNIA WITH MESH  06-13-2012  . EXICISION AXILLARY  MASS Left 11-15-2012  . LAPAROSCOPIC APPENDECTOMY N/A 10/26/2015   Procedure: APPENDECTOMY LAPAROSCOPIC;  Surgeon: Erroll Luna, MD;  Location: [place];  Service: General;  Laterality: N/A;  . LAPAROSCOPIC ASSISTED VAGINAL HYSTERECTOMY N/A 03/15/2015   Procedure: LAPAROSCOPIC ASSISTED VAGINAL HYSTERECTOMY;  Surgeon: Marylynn Pearson, MD;  Location: [place];  Service:  Gynecology;  Laterality: N/A;  . LAPAROSCOPY  x2  last one [facility])   endometriosis  . LAPAROSCOPY N/A 10/26/2015   Procedure: LAPAROSCOPY DIAGNOSTIC;  Surgeon: Erroll Luna, MD;  Location: [place];  Service: General;  Laterality: N/A;  . MANDIBLE FRACTURE SURGERY Bilateral 1998   2000-- [place]   . REMOVAL BENIGN MASTOID TUMOR RIGHT EAR  2007    Family History  Problem Relation Age of Onset  . Cancer Father 38       melanoma - retinal  . Heart disease Maternal Aunt   . Heart disease Maternal Uncle   . Cancer Paternal Aunt 71       breast cancer- reportedly BRCA+ but report not available at present time  . Cancer Paternal Grandfather 70       prostate  . Heart attack Paternal Grandfather   . Cancer Other        reportedly has 2nd cousin on maternal side that is also BRCA+ but no report available  . Heart disease Maternal Aunt   . Heart disease Maternal Aunt     Social History   Socioeconomic History  . Marital status: Married    Spouse name: Not on file  . Number of children: Not on file  . Years of education: Not on file  . Highest education level: Not on file  Occupational History  . Not on file  Social Needs  . Financial resource strain: Not on file  . Food insecurity:    Worry: Not  on file    Inability: Not on file  . Transportation needs:    Medical: Not on file    Non-medical: Not on file  Tobacco Use  . Smoking status: Never Smoker  . Smokeless tobacco: Never Used  Substance and Sexual Activity  . Alcohol use: No  . Drug use: No  . Sexual activity: Yes  Lifestyle  . Physical activity:    Days per week: Not on file    Minutes per session: Not on file  . Stress: Not on file  Relationships  . Social connections:    Talks on phone: Not on file    Gets together: Not on file    Attends religious service: Not on file    Active member of club or organization: Not on file    Attends meetings of clubs or organizations: Not on file     Relationship status: Not on file  . Intimate partner violence:    Fear of current or ex partner: Not on file    Emotionally abused: Not on file    Physically abused: Not on file    Forced sexual activity: Not on file  Other Topics Concern  . Not on file  Social History Narrative  . Not on file    Outpatient Medications Prior to Visit  Medication Sig Dispense Refill  . aspirin-acetaminophen-caffeine (EXCEDRIN MIGRAINE) 250-250-65 MG tablet Take 1 tablet by mouth every 6 (six) hours as needed for headache.    . EPIPEN 2-PAK 0.3 MG/0.3ML SOAJ injection See admin instructions.  0  . Multiple Vitamins-Minerals (MULTIVITAMIN WITH MINERALS) tablet Take 1 tablet by mouth daily.    . pregabalin (LYRICA) 25 MG capsule Take 25 mg by mouth daily.    Marland Kitchen thyroid (NP THYROID) 60 MG tablet Take 60 mg by mouth daily before breakfast.    . thyroid (NP THYROID) 90 MG tablet Take 1 tablet by mouth daily.    . traZODone (DESYREL) 50 MG tablet Take 50 mg by mouth at bedtime.    Marland Kitchen azithromycin (ZITHROMAX) 250 MG tablet Take 2 tablets on Day 1. Then take 1 tablet daily. 6 tablet 0  . benzonatate (TESSALON) 100 MG capsule Take 1 capsule (100 mg total) by mouth 2 (two) times daily as needed for cough. 20 capsule 0   No facility-administered medications prior to visit.     Allergies  Allergen Reactions  . Ciprofloxacin Anaphylaxis, Shortness Of Breath and Swelling  . Codeine Shortness Of Breath and Swelling  . Morphine And Related Shortness Of Breath, Swelling and Other (See Comments)    Severe abdominal cramps, causes what appears to be pancreatitis.  . Shellfish Allergy Anaphylaxis    Shrimp, lobster, crab  . Other     GLUTEN INTOLERENCE  . Adhesive [Tape] Rash    Looks like poison oak where applied   ROS Pertinent ROS are listed in the HPI.    Objective:    Physical Exam  Constitutional: She is oriented to person, place, and time. She appears well-developed and well-nourished.  HENT:  Head:  Normocephalic and atraumatic.  Eyes: Conjunctivae are normal.  Neck: Neck supple.  Cardiovascular: Normal rate, regular rhythm, normal heart sounds and intact distal pulses.  Pulmonary/Chest: Effort normal and breath sounds normal. No respiratory distress. She has no wheezes. She has no rales. She exhibits no tenderness.  Abdominal: Soft. Bowel sounds are normal. She exhibits no distension and no mass. There is tenderness in the suprapubic area. There is no rebound, no guarding  and no CVA tenderness.  Neurological: She is alert and oriented to person, place, and time.  Skin: Skin is warm and dry. No rash noted.  Psychiatric: She has a normal mood and affect.  Vitals reviewed.  BP 120/80   Pulse 87   Temp 98.3 F (36.8 C) (Oral)   Resp 14   Ht _0  (1.651 m)   Wt 182 lb (82.6 kg)   LMP 02/18/2015 (Exact Date)   SpO2 98%   BMI 30.29 kg/m  Wt Readings from Last 3 Encounters:  10/22/18 182 lb (82.6 kg)  10/07/18 181 lb (82.1 kg)  06/20/17 183 lb (83 kg)    Health Maintenance Due  Topic Date Due  . TETANUS/TDAP  05/30/1991    There are no preventive care reminders to display for this patient.   No results found for: TSH Lab Results  Component Value Date   WBC 7.5 06/20/2017   HGB 14.4 06/20/2017   HCT 43.0 06/20/2017   MCV 84.3 06/20/2017   PLT 350 06/20/2017   Lab Results  Component Value Date   NA 137 06/20/2017   K 4.4 06/20/2017   CO2 20 06/20/2017   GLUCOSE 90 06/20/2017   BUN 17 06/20/2017   CREATININE 0.71 06/20/2017   BILITOT 0.3 06/20/2017   ALKPHOS 73 06/20/2017   AST 18 06/20/2017   ALT 23 06/20/2017   PROT 6.9 06/20/2017   ALBUMIN 4.1 06/20/2017   CALCIUM 9.8 06/20/2017   ANIONGAP 8 10/26/2015   No results found for: CHOL No results found for: HDL No results found for: LDLCALC No results found for: TRIG No results found for: CHOLHDL No results found for: HGBA1C     Assessment & Plan:   Problem List Items Addressed This Visit      Other    Pelvic and perineal pain - Primary    New onset. Urine dip with + hgb but otherwise unremarkable. No sign of infection. Culture sent to truly r/o. Giving severity and location of symptoms will check pelvic US today to further assess. Will alter regimen based on results.       Relevant Orders   Urine Microscopic Only   US PELVIC COMPLETE WITH TRANSVAGINAL    Other Visit Diagnoses    Dysuria       Relevant Orders   POCT Urinalysis Dipstick (Completed)   Urine Culture   Urine Microscopic Only       Meds ordered this encounter  Medications  . cyclobenzaprine (FLEXERIL) 5 MG tablet    Sig: Take 1 tablet (5 mg total) by mouth 3 (three) times daily as needed for muscle spasms.    Dispense:  30 tablet    Refill:  1    Order Specific Question:   Supervising Provider    Answer:   Annye Asa E [place], PA-C

## 2018-10-22 NOTE — Patient Instructions (Signed)
Please keep hydrated. Avoid alcohol and caffeine which can cause spasm of the bladder and surrounding musculature.  Use the muscle relaxer this evening to see if this helps with pain. Alternate tylenol and Ibuprofen as well for pain along with relaxant.  Stop at the front desk to speak with staff about scheduling your Ultrasound. Giving location of symptoms this is the most appropriate next step. Will alter regimen based on these results.  ER for any acute worsening of symptoms.

## 2018-10-23 DIAGNOSIS — R102 Pelvic and perineal pain: Secondary | ICD-10-CM | POA: Insufficient documentation

## 2018-10-23 NOTE — Assessment & Plan Note (Signed)
New onset. Urine dip with + hgb but otherwise unremarkable. No sign of infection. Culture sent to truly r/o. Giving severity and location of symptoms will check pelvic US today to further assess. Will alter regimen based on results.

## 2018-10-24 LAB — URINE CULTURE
MICRO NUMBER:: 91394064
SPECIMEN QUALITY:: ADEQUATE

## 2018-10-25 ENCOUNTER — Ambulatory Visit
Admission: RE | Admit: 2018-10-25 | Discharge: 2018-10-25 | Disposition: A | Discharge status: Home / Self Care | Payer: Commercial Managed Care - PPO | Source: Ambulatory Visit | Attending: Physician Assistant | Admitting: Physician Assistant

## 2018-10-25 DIAGNOSIS — R102 Pelvic and perineal pain: Secondary | ICD-10-CM

## 2018-12-19 DIAGNOSIS — M9901 Segmental and somatic dysfunction of cervical region: Secondary | ICD-10-CM | POA: Diagnosis not present

## 2018-12-19 DIAGNOSIS — M5032 Other cervical disc degeneration, mid-cervical region, unspecified level: Secondary | ICD-10-CM | POA: Diagnosis not present

## 2018-12-19 DIAGNOSIS — M531 Cervicobrachial syndrome: Secondary | ICD-10-CM | POA: Diagnosis not present

## 2019-02-05 DIAGNOSIS — M9901 Segmental and somatic dysfunction of cervical region: Secondary | ICD-10-CM | POA: Diagnosis not present

## 2019-02-05 DIAGNOSIS — M531 Cervicobrachial syndrome: Secondary | ICD-10-CM | POA: Diagnosis not present

## 2019-02-05 DIAGNOSIS — M5032 Other cervical disc degeneration, mid-cervical region, unspecified level: Secondary | ICD-10-CM | POA: Diagnosis not present

## 2019-02-07 DIAGNOSIS — Z01419 Encounter for gynecological examination (general) (routine) without abnormal findings: Secondary | ICD-10-CM | POA: Diagnosis not present

## 2019-02-07 DIAGNOSIS — Z6830 Body mass index (BMI) 30.0-30.9, adult: Secondary | ICD-10-CM | POA: Diagnosis not present

## 2019-02-08 DIAGNOSIS — J069 Acute upper respiratory infection, unspecified: Secondary | ICD-10-CM | POA: Diagnosis not present

## 2019-02-08 DIAGNOSIS — R300 Dysuria: Secondary | ICD-10-CM | POA: Diagnosis not present

## 2019-02-21 DIAGNOSIS — M531 Cervicobrachial syndrome: Secondary | ICD-10-CM | POA: Diagnosis not present

## 2019-02-21 DIAGNOSIS — M5032 Other cervical disc degeneration, mid-cervical region, unspecified level: Secondary | ICD-10-CM | POA: Diagnosis not present

## 2019-02-21 DIAGNOSIS — M9901 Segmental and somatic dysfunction of cervical region: Secondary | ICD-10-CM | POA: Diagnosis not present

## 2019-02-24 DIAGNOSIS — R102 Pelvic and perineal pain: Secondary | ICD-10-CM | POA: Diagnosis not present

## 2019-02-24 DIAGNOSIS — R1031 Right lower quadrant pain: Secondary | ICD-10-CM | POA: Diagnosis not present

## 2019-02-24 DIAGNOSIS — N83202 Unspecified ovarian cyst, left side: Secondary | ICD-10-CM | POA: Diagnosis not present

## 2019-02-26 ENCOUNTER — Other Ambulatory Visit: Payer: Self-pay

## 2019-02-26 ENCOUNTER — Telehealth (INDEPENDENT_AMBULATORY_CARE_PROVIDER_SITE_OTHER): Payer: Commercial Managed Care - PPO | Admitting: Gastroenterology

## 2019-02-26 DIAGNOSIS — R112 Nausea with vomiting, unspecified: Secondary | ICD-10-CM

## 2019-02-26 DIAGNOSIS — M5032 Other cervical disc degeneration, mid-cervical region, unspecified level: Secondary | ICD-10-CM | POA: Diagnosis not present

## 2019-02-26 DIAGNOSIS — M531 Cervicobrachial syndrome: Secondary | ICD-10-CM | POA: Diagnosis not present

## 2019-02-26 DIAGNOSIS — R140 Abdominal distension (gaseous): Secondary | ICD-10-CM

## 2019-02-26 DIAGNOSIS — M9901 Segmental and somatic dysfunction of cervical region: Secondary | ICD-10-CM | POA: Diagnosis not present

## 2019-02-26 DIAGNOSIS — R1084 Generalized abdominal pain: Secondary | ICD-10-CM

## 2019-02-26 DIAGNOSIS — R1030 Lower abdominal pain, unspecified: Secondary | ICD-10-CM

## 2019-02-26 MED ORDER — DICYCLOMINE HCL 20 MG PO TABS: 20.0000 mg | ORAL_TABLET | Freq: Three times a day (TID) | ORAL | 3 refills | Status: DC

## 2019-02-26 NOTE — Progress Notes (Addendum)
Referring Provider: Marylynn Pearson, MD Primary Care Physician:  Midge Minium, MD  Tele-visit due to COVID-19 pandemic Patient requested visit virtually, consented to the encounter telephone call Patient seems aware oflimitations, risks, security and privacy concerns of performing an evaluation and management service by telephone and the risk andavailability of in person appointments  Contact made at: 10:30 Patient verified by name and date of birth Location of patient: Home Location provider: [facility] Names of persons participating:Me, patient Time spent on call: 32 minutes  Reason for Consultation: Abdominal pain   IMPRESSION:  Lower abdominal pain with associated bloating    - occurs during the 3rd week of month    - transvaginal ultrasound 02/2019 showed no cause for symptoms  Daily nausea x 4 weeks Family history of colon cancer (paternal grandfather in his 19) Colonoscopy >10 years ago in [place]  No alarm features.  Unclear if GI or GYN in origin given the unusual timing of her symptoms. Seems to be temporally associated with her ovulatory cycle.  Transvaginal ultrasound was reassuring.  Mittelschmerz seems high on the differential, but, she was just seen by GYN earlier this week.   PLAN: Trial of Bentyl 20 mg QID PRN to use during the week of symptoms Continue to use your daily probiotic Agree with trial of low-dose estrogen as recommended by Dr. Julien Girt CT scan of the abd/pelvis with contrast to evaluate for GI causes of the pain Colonoscopy and EGD with duodenal biopsies recommended when coronavirus restrictions are lifted Follow-up visit after CT scan to review the results Consider food diary and menstruation diary  32 minutes spent with the patient today. Greater than 50% was spent in counseling and coordination of care with the patient. The AVS will be mailed to the patient.    HPI: Patty Ruiz is a 48 y.o. female referred by Dr. Julien Girt for  further evaluation of abdominal pain.  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  History is obtained through the patient, review of her referral records, and of her electronic health record.  History of C. difficile last year.  The third week of every month she developed the acute onset of diffuse, although primarily localized in the right lower quadrant,  abdominal cramping with associated bloating and gas. Described as a "tearing in my stomach" and "kicked in the gut." Feels that she cannot button her pants.  Often feels constipated during this time although there is no concurrent patient.  Stools become long, and, and ribbonlike during this period.  Distention improves with a bowel movement.  No change in symptoms with p.o. intake.  Blood or mucus.  Unintentionally gained 11 to 12 pounds over the last 4 to 5 months.  Symptoms do not prevent her from being able to exercise.  She frequently walks on the treadmill.  Has had daily nausea for the last 4 weeks. No other GI symptoms occur the other weeks of the month.  Seen by GYN. Pelvic ultrasound earlier this week did not reveal a source, but, she noted severe abdominal pain during the evaluation. No adnexal mass seen. Dr. Julien Girt suggested restarting loestrin to see if this helps with her symptoms.    Symptoms have improved with heating packs and hot tub soaks. Acetaminophen provides intermittent relief.  She is not using ibuprofen or any NSAIDs. Uses a plexus probiotic product daily. Been avoiding breads for years due to a suspected gluten sensitivity.  Maternal grandfather had colon cancer at  age 62.  Mother has no known history of colon cancer or polyps.  There is no other known family history of colon cancer. Father with malignant melanoma at age 33.  No family history of uterine/endometrial cancer, pancreatic cancer or gastric/stomach cancer.   Patient reports a normal colonoscopy was performed in [place] over  10 years ago for her family history of colon cancer.  Results are not available in EPIC.  She has had 2 friends die of colon cancer at an early age and is concerned about the possibility. This is her primary concern today.    Past Medical History:  Diagnosis Date  . Dysmenorrhea   . Endometriosis   . Family history of breast cancer   . Gluten intolerance   . 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 Hyperlipidemia   . Hypothyroidism   . Menorrhagia   . PCOD (polycystic ovarian disease)   . PONV (postoperative nausea and vomiting)   . 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 ANTERIOR CERVICAL DECOMP/DISCECTOMY FUSION  2000   C4 -- C5  . BILATERAL SALPINGECTOMY Bilateral 03/15/2015   Procedure: BILATERAL SALPINGECTOMY;  Surgeon: Marylynn Pearson, MD;  Location: [facility];  Service: Gynecology;  Laterality: Bilateral;  . BREAST EXCISIONAL BIOPSY Left 2017   benign  . BREAST EXCISIONAL BIOPSY Left 2008   benign  . CHOLECYSTECTOMY  2004  ([place], [place])  . DX LAPAROSOCPY FOR PELVIC PAIN/   REPAIR UMBILICAL HERNIA WITH MESH  06-13-2012  . EXICISION AXILLARY  MASS Left 11-15-2012  . LAPAROSCOPIC APPENDECTOMY N/A 10/26/2015   Procedure: APPENDECTOMY LAPAROSCOPIC;  Surgeon: Erroll Luna, MD;  Location: [facility];  Service: General;  Laterality: N/A;  . LAPAROSCOPIC ASSISTED VAGINAL HYSTERECTOMY N/A 03/15/2015   Procedure: LAPAROSCOPIC ASSISTED VAGINAL HYSTERECTOMY;  Surgeon: Marylynn Pearson, MD;  Location: [facility];  Service: Gynecology;  Laterality: N/A;  . LAPAROSCOPY  x2  last one 2011 [facility])   endometriosis  . LAPAROSCOPY N/A 10/26/2015   Procedure: LAPAROSCOPY DIAGNOSTIC;  Surgeon: Erroll Luna, MD;  Location: [facility];  Service: General;  Laterality: N/A;  . MANDIBLE FRACTURE SURGERY  Bilateral 1998   2000-- [place]   . REMOVAL BENIGN MASTOID TUMOR RIGHT EAR  2007    Current Outpatient Medications  Medication Sig Dispense Refill  . aspirin-acetaminophen-caffeine (EXCEDRIN MIGRAINE) 250-250-65 MG tablet Take 1 tablet by mouth every 6 (six) hours as needed for headache.    . cyclobenzaprine (FLEXERIL) 5 MG tablet Take 1 tablet (5 mg total) by mouth 3 (three) times daily as needed for muscle spasms. 30 tablet 1  . EPIPEN 2-PAK 0.3 MG/0.3ML SOAJ injection See admin instructions.  0  . Multiple Vitamins-Minerals (MULTIVITAMIN WITH MINERALS) tablet Take 1 tablet by mouth daily.    . pregabalin (LYRICA) 25 MG capsule Take 25 mg by mouth daily.    Marland Kitchen thyroid (NP THYROID) 60 MG tablet Take 60 mg by mouth daily before breakfast.    . thyroid (NP THYROID) 90 MG tablet Take 1 tablet by mouth daily.    . traZODone (DESYREL) 50 MG tablet Take 50 mg by mouth at bedtime.     No current facility-administered medications for this visit.     Allergies as of 02/26/2019 - Review Complete 02/26/2019  Allergen Reaction Noted  .  Ciprofloxacin Anaphylaxis, Shortness Of Breath, and Swelling 05/15/2012  . Codeine Shortness Of Breath and Swelling 05/15/2012  . Morphine and related Shortness Of Breath, Swelling, and Other (See Comments) 05/15/2012  . Shellfish allergy Anaphylaxis 03/10/2015  . Other  03/10/2015  . Adhesive [tape] Rash 06/14/2012    Family History  Problem Relation Age of Onset  . Cancer Father 61       melanoma - retinal  . Heart disease Maternal Aunt   . Heart disease Maternal Uncle   . Cancer Paternal Aunt 39       breast cancer- reportedly BRCA+ but report not available at present time  . Cancer Paternal Grandfather 1       prostate  . Heart attack Paternal Grandfather   . Cancer Other        reportedly has 2nd cousin on maternal side that is also BRCA+ but no report available  . Heart disease Maternal Aunt   . Heart disease Maternal Aunt      Social History   Socioeconomic History  . Marital status: Married    Spouse name: Not on file  . Number of children: Not on file  . Years of education: Not on file  . Highest education level: Not on file  Occupational History  . Not on file  Social Needs  . Financial resource strain: Not on file  . Food insecurity:    Worry: Not on file    Inability: Not on file  . Transportation needs:    Medical: Not on file    Non-medical: Not on file  Tobacco Use  . Smoking status: Never Smoker  . Smokeless tobacco: Never Used  Substance and Sexual Activity  . Alcohol use: No  . Drug use: No  . Sexual activity: Yes  Lifestyle  . Physical activity:    Days per week: Not on file    Minutes per session: Not on file  . Stress: Not on file  Relationships  . Social connections:    Talks on phone: Not on file    Gets together: Not on file    Attends religious service: Not on file    Active member of club or organization: Not on file    Attends meetings of clubs or organizations: Not on file    Relationship status: Not on file  . Intimate partner violence:    Fear of current or ex partner: Not on file    Emotionally abused: Not on file    Physically abused: Not on file    Forced sexual activity: Not on file  Other Topics Concern  . Not on file  Social History Narrative  . Not on file    Review of Systems: 12 system ROS is negative except as noted above.    Physical Exam: General: Pleasant. No obvious distress. Cognition intact. Exam limited in the setting of [place] visit.   Kimberly L. Tarri Glenn, MD, MPH [facility] 02/26/2019, 10:39 AM

## 2019-02-26 NOTE — Patient Instructions (Signed)
Trial of Bentyl 20 mg QID PRN Continue to use your daily probiotic Agree with trial of low-dose estrogen as recommended by Dr. Julien Girt CT scan of the abd/pelvis with contrast Colonoscopy recommended when coronavirus restrictions are lifted Follow-up visit after CT scan to review the results

## 2019-02-28 ENCOUNTER — Telehealth: Payer: Self-pay | Admitting: Gastroenterology

## 2019-02-28 NOTE — Telephone Encounter (Signed)
Pt would like to know if her CT scan was scheduled. PT had a phone visit on 3-25

## 2019-02-28 NOTE — Telephone Encounter (Signed)
Spoke to patient, informed her that CT is only seeing patients with urgent needs due to covid19 outbreak. And we will call her when we are able to schedule and restrictions are lifted.

## 2019-03-11 DIAGNOSIS — M531 Cervicobrachial syndrome: Secondary | ICD-10-CM | POA: Diagnosis not present

## 2019-03-11 DIAGNOSIS — M5032 Other cervical disc degeneration, mid-cervical region, unspecified level: Secondary | ICD-10-CM | POA: Diagnosis not present

## 2019-03-11 DIAGNOSIS — M9901 Segmental and somatic dysfunction of cervical region: Secondary | ICD-10-CM | POA: Diagnosis not present

## 2019-03-18 DIAGNOSIS — L821 Other seborrheic keratosis: Secondary | ICD-10-CM | POA: Diagnosis not present

## 2019-03-18 DIAGNOSIS — Z23 Encounter for immunization: Secondary | ICD-10-CM | POA: Diagnosis not present

## 2019-03-18 DIAGNOSIS — D2222 Melanocytic nevi of left ear and external auricular canal: Secondary | ICD-10-CM | POA: Diagnosis not present

## 2019-03-18 DIAGNOSIS — D2239 Melanocytic nevi of other parts of face: Secondary | ICD-10-CM | POA: Diagnosis not present

## 2019-04-16 DIAGNOSIS — E559 Vitamin D deficiency, unspecified: Secondary | ICD-10-CM | POA: Diagnosis not present

## 2019-04-16 DIAGNOSIS — E282 Polycystic ovarian syndrome: Secondary | ICD-10-CM | POA: Diagnosis not present

## 2019-04-16 DIAGNOSIS — E039 Hypothyroidism, unspecified: Secondary | ICD-10-CM | POA: Diagnosis not present

## 2019-04-21 NOTE — Addendum Note (Signed)
Addended by: Wyline Beady on: 04/21/2019 02:05 PM   Modules accepted: Orders

## 2019-05-15 ENCOUNTER — Other Ambulatory Visit: Payer: Self-pay | Admitting: Emergency Medicine

## 2019-05-15 DIAGNOSIS — R140 Abdominal distension (gaseous): Secondary | ICD-10-CM

## 2019-05-15 DIAGNOSIS — R1084 Generalized abdominal pain: Secondary | ICD-10-CM

## 2019-05-15 DIAGNOSIS — R112 Nausea with vomiting, unspecified: Secondary | ICD-10-CM

## 2019-05-15 DIAGNOSIS — R1030 Lower abdominal pain, unspecified: Secondary | ICD-10-CM

## 2019-05-15 MED ORDER — NA SULFATE-K SULFATE-MG SULF 17.5-3.13-1.6 GM/177ML PO SOLN: 1.0000 | ORAL | 0 refills | Status: DC

## 2019-05-31 ENCOUNTER — Other Ambulatory Visit: Payer: Self-pay | Admitting: Gastroenterology

## 2019-06-02 ENCOUNTER — Ambulatory Visit
Admission: RE | Admit: 2019-06-02 | Discharge: 2019-06-02 | Disposition: A | Discharge status: Home / Self Care | Payer: Commercial Managed Care - PPO | Source: Ambulatory Visit | Attending: Gastroenterology | Admitting: Gastroenterology

## 2019-06-02 ENCOUNTER — Other Ambulatory Visit: Payer: Self-pay

## 2019-06-02 DIAGNOSIS — R1030 Lower abdominal pain, unspecified: Secondary | ICD-10-CM

## 2019-06-02 DIAGNOSIS — R112 Nausea with vomiting, unspecified: Secondary | ICD-10-CM

## 2019-06-02 MED ORDER — IOPAMIDOL (ISOVUE-300) INJECTION 61%
100.0000 mL | Freq: Once | INTRAVENOUS | Status: AC | PRN
  Administered 2019-06-02: 100 mL via INTRAVENOUS

## 2019-06-16 ENCOUNTER — Telehealth: Payer: Self-pay | Admitting: Gastroenterology

## 2019-06-16 NOTE — Telephone Encounter (Signed)

## 2019-06-17 ENCOUNTER — Other Ambulatory Visit: Payer: Self-pay

## 2019-06-17 ENCOUNTER — Encounter: Payer: Self-pay | Admitting: Gastroenterology

## 2019-06-17 ENCOUNTER — Ambulatory Visit (AMBULATORY_SURGERY_CENTER): Payer: Commercial Managed Care - PPO | Admitting: Gastroenterology

## 2019-06-17 VITALS — BP 124/72 | HR 64 | Temp 99.1°F | Resp 15 | Ht 65.0 in | Wt 182.0 lb

## 2019-06-17 DIAGNOSIS — K5289 Other specified noninfective gastroenteritis and colitis: Secondary | ICD-10-CM | POA: Diagnosis not present

## 2019-06-17 DIAGNOSIS — K219 Gastro-esophageal reflux disease without esophagitis: Secondary | ICD-10-CM

## 2019-06-17 DIAGNOSIS — K621 Rectal polyp: Secondary | ICD-10-CM | POA: Diagnosis not present

## 2019-06-17 DIAGNOSIS — R112 Nausea with vomiting, unspecified: Secondary | ICD-10-CM

## 2019-06-17 DIAGNOSIS — K449 Diaphragmatic hernia without obstruction or gangrene: Secondary | ICD-10-CM | POA: Diagnosis not present

## 2019-06-17 DIAGNOSIS — R1084 Generalized abdominal pain: Secondary | ICD-10-CM

## 2019-06-17 DIAGNOSIS — D128 Benign neoplasm of rectum: Secondary | ICD-10-CM

## 2019-06-17 DIAGNOSIS — K3189 Other diseases of stomach and duodenum: Secondary | ICD-10-CM

## 2019-06-17 DIAGNOSIS — K2980 Duodenitis without bleeding: Secondary | ICD-10-CM

## 2019-06-17 MED ORDER — SODIUM CHLORIDE 0.9 % IV SOLN: 500.0000 mL | Freq: Once | INTRAVENOUS | Status: DC

## 2019-06-17 NOTE — Op Note (Signed)
[facility] Patient Name: Aaniyah Strohm Procedure Date: 06/17/2019 2:00 PM MRN: [id number] Endoscopist: Thornton Park MD, MD Age: 48 Referring MD:  Date of Birth: [birth year] Gender: Female Account #: [id number] Procedure:                Upper GI endoscopy Indications:              Nausea - daily                           Lower abdominal pain with associated bloating                           - occurs during the 3rd week of month                           - transvaginal ultrasound 02/2019 showed no cause                            for symptoms Medicines:                See the Anesthesia note for documentation of the                            administered medications Procedure:                Pre-Anesthesia Assessment:                           - Prior to the procedure, a History and Physical                            was performed, and patient medications and                            allergies were reviewed. The patient's tolerance of                            previous anesthesia was also reviewed. The risks                            and benefits of the procedure and the sedation                            options and risks were discussed with the patient.                            All questions were answered, and informed consent                            was obtained. Prior Anticoagulants: The patient has                            taken no previous anticoagulant or antiplatelet  agents. ASA Grade Assessment: I - A normal, healthy                            patient. After reviewing the risks and benefits,                            the patient was deemed in satisfactory condition to                            undergo the procedure.                           After obtaining informed consent, the endoscope was                            passed under direct vision. Throughout the                            procedure, the patient's blood  pressure, pulse, and                            oxygen saturations were monitored continuously. The                            Endoscope was introduced through the mouth, and                            advanced to the third part of duodenum. The upper                            GI endoscopy was accomplished without difficulty.                            The patient tolerated the procedure well. Scope In: Scope Out: Findings:                 The examined esophagus was normal. Biopsies were                            obtained from the proximal and distal esophagus                            with cold forceps for histology of suspected                            eosinophilic esophagitis.                           Patchy mildly erythematous mucosa without bleeding                            was found in the gastric body and in the gastric                            antrum.  Biopsies were taken with a cold forceps for                            histology.                           The examined duodenum was normal. Biopsies were                            taken with a cold forceps for histology. Estimated                            blood loss was minimal.                           Small hiatal hernia. The cardia and gastric fundus                            were otherwise normal on retroflexion.                           The exam was otherwise without abnormality. Complications:            No immediate complications. Estimated blood loss:                            Minimal. Estimated Blood Loss:     Estimated blood loss was minimal. Impression:               - Normal esophagus. Biopsied.                           - Erythematous mucosa in the gastric body and                            antrum. Biopsied.                           - Normal examined duodenum. Biopsied.                           - The examination was otherwise normal. Recommendation:           - Patient has a contact number  available for                            emergencies. The signs and symptoms of potential                            delayed complications were discussed with the                            patient. Return to normal activities tomorrow.                            Written discharge instructions were provided to the  patient.                           - Patient has a contact number available for                            emergencies. The signs and symptoms of potential                            delayed complications were discussed with the                            patient. Return to normal activities tomorrow.                            Written discharge instructions were provided to the                            patient.                           - Resume regular diet today.                           - Continue present medications.                           - Avoid all NSAIDs.                           - Await pathology results.                           - Proceed with colonoscopy as previously planned. Thornton Park MD, MD 06/17/2019 2:39:11 PM This report has been signed electronically.

## 2019-06-17 NOTE — Telephone Encounter (Signed)
Patient called back and answered no to all questions

## 2019-06-17 NOTE — Progress Notes (Signed)
Called to room to assist during endoscopic procedure.  Patient ID and intended procedure confirmed with present staff. Received instructions for my participation in the procedure from the performing physician.  

## 2019-06-17 NOTE — Op Note (Signed)
[facility] Patient Name: Patty Ruiz Procedure Date: 06/17/2019 1:59 PM MRN: [id number] Endoscopist: Thornton Park MD, MD Age: 48 Referring MD:  Date of Birth: 10/13/1971 Gender: Female Account #: [id number] Procedure:                Colonoscopy Indications:              Lower abdominal pain with associated bloating                           - occurs during the 3rd week of month                           - transvaginal ultrasound 02/2019 showed no cause                            for symptoms                           Daily nausea x 4 weeks                           Family history of colon cancer (paternal                            grandfather in his 42)                           Colonoscopy >10 years ago in [place]:                See the Anesthesia note for documentation of the                            administered medications Procedure:                Pre-Anesthesia Assessment:                           - Prior to the procedure, a History and Physical                            was performed, and patient medications and                            allergies were reviewed. The patient's tolerance of                            previous anesthesia was also reviewed. The risks                            and benefits of the procedure and the sedation                            options and risks were discussed with the patient.                            All questions were answered,  and informed consent                            was obtained. Prior Anticoagulants: The patient has                            taken no previous anticoagulant or antiplatelet                            agents. ASA Grade Assessment: I - A normal, healthy                            patient. After reviewing the risks and benefits,                            the patient was deemed in satisfactory condition to                            undergo the procedure.  After obtaining informed consent, the colonoscope                            was passed under direct vision. Throughout the                            procedure, the patient's blood pressure, pulse, and                            oxygen saturations were monitored continuously. The                            Colonoscope was introduced through the anus and                            advanced to the the terminal ileum, with                            identification of the appendiceal orifice and IC                            valve. The colonoscopy was performed with moderate                            difficulty due to a redundant colon, significant                            looping and a tortuous colon. A second forward view                            of the right colon was performed. Successful                            completion of the procedure was aided by applying  abdominal pressure. The patient tolerated the                            procedure well. The quality of the bowel                            preparation was good. The terminal ileum, ileocecal                            valve, appendiceal orifice, and rectum were                            photographed. Scope In: 2:16:57 PM Scope Out: 2:33:37 PM Scope Withdrawal Time: 0 hours 13 minutes 15 seconds  Total Procedure Duration: 0 hours 16 minutes 40 seconds  Findings:                 The perianal and digital rectal examinations were                            normal.                           A 3 mm polyp was found in the rectum. The polyp was                            sessile. The polyp was removed with a cold snare.                            Resection and retrieval were complete. Estimated                            blood loss was minimal.                           A diffuse area of very mildly erythematous mucosa                            was found in the cecum. Biopsies were taken with a                             cold forceps for histology. Estimated blood loss                            was minimal.                           The distal ileum appeared normal except for an area                            of erythema and nodularity at the. Approximately 10                            cm was examined. Biopsies were taken with a cold  forceps for histology. Estimated blood loss was                            minimal.                           The exam was otherwise without abnormality on                            direct and retroflexion views. Complications:            No immediate complications. Estimated blood loss:                            Minimal. Estimated Blood Loss:     Estimated blood loss was minimal. Impression:               - One 3 mm polyp in the rectum, removed with a cold                            snare. Resected and retrieved.                           - Erythematous mucosa in the cecum. Biopsied.                           - The examined portion of the ileum was normal.                            Biopsied.                           - The examination was otherwise normal on direct                            and retroflexion views. Recommendation:           - Written discharge instructions were provided to                            the patient.                           - The signs and symptoms of potential delayed                            complications were discussed with the patient.                           - Patient has a contact number available for                            emergencies.                           - Return to normal activities tomorrow.                           -  Resume regular diet today.                           - Continue present medications.                           - Await pathology results.                           - Repeat colonoscopy date to be determined after                            pending  pathology results are reviewed for                            surveillance based on pathology results. Thornton Park MD, MD 06/17/2019 2:44:52 PM This report has been signed electronically.

## 2019-06-17 NOTE — Patient Instructions (Signed)
Thank you for allowing [place] to participate in your care today!  Await pathology results by mail, approximately 2 weeks.  Next colonoscopy date to be determined after biopsies are final.  Resume previous diet and medications today.  Return to your normal activities tomorrow.  Avoid all NSAIDS (Aspirin, Ibuprofen/Motrin, Alleve/Naprosyn, etc.)        YOU HAD AN ENDOSCOPIC PROCEDURE TODAY AT [place]:   Refer to the procedure report that was given to you for any specific questions about what was found during the examination.  If the procedure report does not answer your questions, please call your gastroenterologist to clarify.  If you requested that your care partner not be given the details of your procedure findings, then the procedure report has been included in a sealed envelope for you to review at your convenience later.  YOU SHOULD EXPECT: Some feelings of bloating in the abdomen. Passage of more gas than usual.  Walking can help get rid of the air that was put into your GI tract during the procedure and reduce the bloating. If you had a lower endoscopy (such as a colonoscopy or flexible sigmoidoscopy) you may notice spotting of blood in your stool or on the toilet paper. If you underwent a bowel prep for your procedure, you may not have a normal bowel movement for a few days.  Please Note:  You might notice some irritation and congestion in your nose or some drainage.  This is from the oxygen used during your procedure.  There is no need for concern and it should clear up in a day or so.  SYMPTOMS TO REPORT IMMEDIATELY:   Following lower endoscopy (colonoscopy or flexible sigmoidoscopy):  Excessive amounts of blood in the stool  Significant tenderness or worsening of abdominal pains  Swelling of the abdomen that is new, acute  Fever of 100F or higher   Following upper endoscopy (EGD)  Vomiting of blood or coffee ground material  New chest pain or pain under  the shoulder blades  Painful or persistently difficult swallowing  New shortness of breath  Fever of 100F or higher  Black, tarry-looking stools  For urgent or emergent issues, a gastroenterologist can be reached at any hour by calling [phone].   DIET:  We do recommend a small meal at first, but then you may proceed to your regular diet.  Drink plenty of fluids but you should avoid alcoholic beverages for 24 hours.  ACTIVITY:  You should plan to take it easy for the rest of today and you should NOT DRIVE or use heavy machinery until tomorrow (because of the sedation medicines used during the test).    FOLLOW UP: Our staff will call the number listed on your records 48-72 hours following your procedure to check on you and address any questions or concerns that you may have regarding the information given to you following your procedure. If we do not reach you, we will leave a message.  We will attempt to reach you two times.  During this call, we will ask if you have developed any symptoms of COVID 19. If you develop any symptoms (ie: fever, flu-like symptoms, shortness of breath, cough etc.) before then, please call [phone].  If you test positive for Covid 19 in the 2 weeks post procedure, please call and report this information to [place].    If any biopsies were taken you will be contacted by phone or by letter within the next 1-3 weeks.  Please call us at [phone] if you have not heard about the biopsies in 3 weeks.    SIGNATURES/CONFIDENTIALITY: You and/or your care partner have signed paperwork which will be entered into your electronic medical record.  These signatures attest to the fact that that the information above on your After Visit Summary has been reviewed and is understood.  Full responsibility of the confidentiality of this discharge information lies with you and/or your care-partner.

## 2019-06-17 NOTE — Progress Notes (Signed)
Pt's states no medical or surgical changes since previsit or office visit. 

## 2019-06-17 NOTE — Progress Notes (Signed)
PT taken to PACU. Monitors in place. VSS. Report given to RN. 

## 2019-06-19 ENCOUNTER — Telehealth: Payer: Self-pay

## 2019-06-19 NOTE — Telephone Encounter (Signed)
  Follow up Call-  Call back number 06/17/2019  Post procedure Call Back phone  # [phone]  Permission to leave phone message Yes  Some recent data might be hidden     Patient questions:  Do you have a fever, pain , or abdominal swelling? No. Pain Score  0 *  Have you tolerated food without any problems? Yes.    Have you been able to return to your normal activities? Yes.    Do you have any questions about your discharge instructions: Diet   No. Medications  No. Follow up visit  No.  Do you have questions or concerns about your Care? No.  Actions: * If pain score is 4 or above: No action needed, pain <4. 1. Have you developed a fever since your procedure? no  2.   Have you had an respiratory symptoms (SOB or cough) since your procedure? no  3.   Have you tested positive for COVID 19 since your procedure no  4.   Have you had any family members/close contacts diagnosed with the COVID 19 since your procedure?  no   If yes to any of these questions please route to Joylene John, RN and Alphonsa Gin, Therapist, sports.

## 2019-06-23 ENCOUNTER — Other Ambulatory Visit: Payer: Self-pay | Admitting: *Deleted

## 2019-06-23 MED ORDER — PANTOPRAZOLE SODIUM 40 MG PO TBEC: 40.0000 mg | DELAYED_RELEASE_TABLET | Freq: Every day | ORAL | 3 refills | Status: DC

## 2019-06-23 NOTE — Progress Notes (Signed)
Entered in error

## 2019-07-29 ENCOUNTER — Encounter: Payer: Self-pay | Admitting: *Deleted

## 2019-08-27 ENCOUNTER — Other Ambulatory Visit: Payer: Self-pay

## 2019-08-27 ENCOUNTER — Ambulatory Visit (INDEPENDENT_AMBULATORY_CARE_PROVIDER_SITE_OTHER): Payer: Commercial Managed Care - PPO | Admitting: Gastroenterology

## 2019-08-27 ENCOUNTER — Encounter: Payer: Self-pay | Admitting: Gastroenterology

## 2019-08-27 VITALS — BP 114/84 | HR 74 | Temp 97.9°F | Ht 65.0 in | Wt 188.2 lb

## 2019-08-27 DIAGNOSIS — R1084 Generalized abdominal pain: Secondary | ICD-10-CM | POA: Diagnosis not present

## 2019-08-27 MED ORDER — DICYCLOMINE HCL 20 MG PO TABS: 20.0000 mg | ORAL_TABLET | Freq: Three times a day (TID) | ORAL | 1 refills | Status: DC

## 2019-08-27 NOTE — Progress Notes (Signed)
Referring Provider: Marylynn Pearson, MD Primary Care Physician:  Midge Minium, MD  Chief complaint: Abdominal pain   IMPRESSION:  Lower abdominal pain with associated bloating    - occurs during the 3rd week of month    - transvaginal ultrasound 02/2019 showed no cause for symptoms     - No obvious source on CT abd/pelvis 06/02/19, EGD or colonoscopy 06/17/19 Duodenitis and reactive gastropathy on EGD 06/17/2019    -Biopsies negative for H. Pylori Mild cecal erythema on colonoscopy 06/17/2019    -Biopsies negative for inflammatory bowel disease Daily nausea x 4 weeks Small hiatal hernia Family history of colon cancer (maternal grandfather in his 52) C Diff 2019 Patient follows a gluten-free diet due to suspected gluten sensitivity Prior cholecystectomy Colonoscopy >10 years ago in [place] Hyperplastic polyp on colonoscopy 06/17/19  She is had decreasing frequency of her abdominal pain.  Symptoms responding to Bentyl.  Differential includes IBS, including postinfectious IBS, and adhesive disease related to her prior hysterectomy.  She does not feel that further evaluation is needed at this time.   PLAN: I have encouraged her to keep a food diary to identify foods that may trigger her symptoms Continue to use Bentyl 20 mg QID PRN (30 days with 3 months) Continue daily probiotics Avoid all NSAIDs With future symptoms: ESR, CRP, and fecal calprotectin Colon cancer screening in 10 years, earlier with new symptoms Return in 6 months or earlier as needed   HPI: Patty Ruiz is a 48 y.o. female referred by Dr. Julien Girt under evaluation of abdominal pain.  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  Interval history is obtained through the patient and review of her electronic health record.  History of C. difficile last year.  Colonoscopy and EGD 06/17/19:  - The perianal and digital rectal examinations were normal. - A 3 mm polyp was found in the  rectum. The polyp was sessile. The polyp was removed with a cold snare. Resection and retrieval were complete. Estimated blood loss was minimal. - A diffuse area of very mildly erythematous mucosa was found in the cecum. Biopsies were taken with a cold forceps for histology. Estimated blood loss was minimal. - The distal ileum appeared normal except for an area of erythema and nodularity at the. Approximately 10 cm was examined. Biopsies were taken with a cold forceps for histology. Estimated blood loss was minimal.  - The examined esophagus was normal. Biopsies were obtained from the proximal and distal esophagus with cold forceps for histology of suspected eosinophilic esophagitis. - Patchy mildly erythematous mucosa without bleeding was found in the gastric body and in the gastric antrum. Biopsies were taken with a cold forceps for histology. - The examined duodenum was normal. Biopsies were taken with a cold forceps for histology. Estimated blood loss was minimal. - Small hiatal hernia. The cardia and gastric fundus were otherwise normal on retroflexion. - The exam was otherwise without abnormality.  Pathology results from her recent endoscopic evaluation:  1. 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 SEE NOTE 2. Surgical [P], gastric - GASTRIC ANTRAL MUCOSA WITH MILD NONSPECIFIC REACTIVE GASTROPATHY - GASTRIC OXYNTIC MUCOSA WITH NO SPECIFIC HISTOPATHOLOGIC CHANGES - WARTHIN STARRY STAIN IS NEGATIVE FOR HELICOBACTER PYLORI 3. Surgical [P], distal esophagus - ESOPHAGEAL SQUAMOUS MUCOSA WITH MILD VASCULAR CONGESTION, AND FOCAL SQUAMOUS BALLOONING, SUGGESTIVE OF REFLUX ESOPHAGITIS - NEGATIVE FOR INCREASED INTRAEPITHELIAL EOSINOPHILS 4. Surgical [P], mid/proximal esophagus - ESOPHAGEAL SQUAMOUS  MUCOSA WITH MILD VASCULAR CONGESTION, AND FOCAL SQUAMOUS BALLOONING, SUGGESTIVE OF REFLUX ESOPHAGITIS - NEGATIVE FOR INCREASED  INTRAEPITHELIAL EOSINOPHILS - PAS/F STAIN IS NEGATIVE FOR FUNGAL ORGANISMS 5. Surgical [P], colon, cecum - MILDLY ACTIVE CHRONIC NONSPECIFIC COLITIS. SEE NOTE - NEGATIVE FOR GRANULOMAS 6. Surgical [P], small bowel, terminal ileum - ILEAL MUCOSA WITH NO SPECIFIC HISTOPATHOLOGIC CHANGES - NEGATIVE FOR ACUTE INFLAMMATION, FEATURES OF CHRONICITY OR GRANULOMAS  She has had two episodes of the cramping since her initial consultation.  Pain remains primarily localized in the right lower quadrant and has associated abdominal cramping,  bloating and gas. Described as a "tearing in my stomach" and "kicked in the gut." She is less concerned now that it is related to her ovaries and menstrual cycle.  She is satisfied with the response of her symptoms to Bentyl used on a as needed basis.  She wonders if her symptoms are related to her prior hysterectomy.  Had previously experienced relief with heating packs, hot tub soaks, and acetaminophen provides intermittent relief.  She is not using ibuprofen or any NSAIDs. Uses a plexus probiotic product daily.  She has no new complaints or concerns at this time.  I have personally reviewed the images from her CT of the abdomen and pelvis with contrast 06/02/2019.  It shows no source for her abdominal pain.  There is nonobstructive left nephrolithiasis.  Past Medical History:  Diagnosis Date  . Dysmenorrhea   . Endometriosis   . Family history of breast cancer   . Gluten intolerance   . 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 Hyperlipidemia   . Hypothyroidism   . Menorrhagia   . PCOD (polycystic ovarian disease)   . PONV (postoperative nausea and vomiting)   . 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 ANTERIOR CERVICAL DECOMP/DISCECTOMY FUSION  2000   C4 -- C5  . BILATERAL SALPINGECTOMY Bilateral  03/15/2015   Procedure: BILATERAL SALPINGECTOMY;  Surgeon: Marylynn Pearson, MD;  Location: [facility];  Service: Gynecology;  Laterality: Bilateral;  . BREAST EXCISIONAL BIOPSY Left 2017   benign  . BREAST EXCISIONAL BIOPSY Left 2008   benign  . CHOLECYSTECTOMY  2004  ([place], [place])  . DX LAPAROSOCPY FOR PELVIC PAIN/   REPAIR UMBILICAL HERNIA WITH MESH  06-13-2012  . EXICISION AXILLARY  MASS Left 11-15-2012  . LAPAROSCOPIC APPENDECTOMY N/A 10/26/2015   Procedure: APPENDECTOMY LAPAROSCOPIC;  Surgeon: Erroll Luna, MD;  Location: [facility];  Service: General;  Laterality: N/A;  . LAPAROSCOPIC ASSISTED VAGINAL HYSTERECTOMY N/A 03/15/2015   Procedure: LAPAROSCOPIC ASSISTED VAGINAL HYSTERECTOMY;  Surgeon: Marylynn Pearson, MD;  Location: [place];  Service: Gynecology;  Laterality: N/A;  . LAPAROSCOPY  x2  last one 2011 [facility])   endometriosis  . LAPAROSCOPY N/A 10/26/2015   Procedure: LAPAROSCOPY DIAGNOSTIC;  Surgeon: Erroll Luna, MD;  Location: [place];  Service: General;  Laterality: N/A;  . MANDIBLE FRACTURE SURGERY Bilateral 1998   2000-- [place]   . REMOVAL BENIGN MASTOID TUMOR RIGHT EAR  2007    Current Outpatient Medications  Medication Sig Dispense Refill  . aspirin-acetaminophen-caffeine (EXCEDRIN MIGRAINE) 250-250-65 MG tablet Take 1 tablet by mouth every 6 (six) hours as needed for headache.    . dicyclomine (BENTYL) 20 MG tablet TAKE 1 TABLET (20 MG TOTAL) BY MOUTH  4 (FOUR) TIMES DAILY - BEFORE MEALS AND AT BEDTIME. 360 tablet 1  . EPIPEN 2-PAK 0.3 MG/0.3ML SOAJ injection See admin instructions.  0  . Multiple Vitamins-Minerals (MULTIVITAMIN WITH MINERALS) tablet Take 1 tablet by mouth daily.    . pantoprazole (PROTONIX) 40 MG tablet Take 1 tablet (40 mg total) by mouth daily. 90 tablet 3  . pregabalin (LYRICA) 25 MG capsule Take 25 mg by mouth daily.    Marland Kitchen thyroid (ARMOUR) 120 MG tablet Take 120 mg by mouth daily.    .  traZODone (DESYREL) 50 MG tablet Take 50 mg by mouth at bedtime.     No current facility-administered medications for this visit.     Allergies as of 08/27/2019 - Review Complete 08/27/2019  Allergen Reaction Noted  . Ciprofloxacin Anaphylaxis, Shortness Of Breath, and Swelling 05/15/2012  . Codeine Shortness Of Breath and Swelling 05/15/2012  . Morphine and related Shortness Of Breath, Swelling, and Other (See Comments) 05/15/2012  . Shellfish allergy Anaphylaxis 03/10/2015  . Other  03/10/2015  . Adhesive [tape] Rash 06/14/2012    Family History  Problem Relation Age of Onset  . Cancer Father 67       melanoma - retinal  . Heart disease Maternal Aunt   . Heart disease Maternal Uncle   . Cancer Paternal Aunt 79       breast cancer- reportedly BRCA+ but report not available at present time  . Cancer Paternal Grandfather 82       prostate  . Heart attack Paternal Grandfather   . Cancer Other        reportedly has 2nd cousin on maternal side that is also BRCA+ but no report available  . Heart disease Maternal Aunt   . Heart disease Maternal Aunt     Social History   Socioeconomic History  . Marital status: Married    Spouse name: Not on file  . Number of children: Not on file  . Years of education: Not on file  . Highest education level: Not on file  Occupational History  . Not on file  Social Needs  . Financial resource strain: Not on file  . Food insecurity    Worry: Not on file    Inability: Not on file  . Transportation needs    Medical: Not on file    Non-medical: Not on file  Tobacco Use  . Smoking status: Never Smoker  . Smokeless tobacco: Never Used  Substance and Sexual Activity  . Alcohol use: No  . Drug use: No  . Sexual activity: Yes  Lifestyle  . Physical activity    Days per week: Not on file    Minutes per session: Not on file  . Stress: Not on file  Relationships  . Social Herbalist on phone: Not on file    Gets together: Not  on file    Attends religious service: Not on file    Active member of club or organization: Not on file    Attends meetings of clubs or organizations: Not on file    Relationship status: Not on file  . Intimate partner violence    Fear of current or ex partner: Not on file    Emotionally abused: Not on file    Physically abused: Not on file    Forced sexual activity: Not on file  Other Topics Concern  . Not on file  Social History Narrative  . Not on file    Physical  Exam: General:   Alert,  well-nourished, pleasant and cooperative in NAD.  Eyes are open. Head:  Normocephalic and atraumatic. Eyes:  Sclera clear, no icterus.   Conjunctiva pink. Ears:  Normal auditory acuity. Nose:  No deformity, discharge,  or lesions. Mouth:  No deformity or lesions.   Neck:  Supple; no masses or thyromegaly. Lungs:  Clear throughout to auscultation.   No wheezes. Heart:  Regular rate and rhythm; no murmurs. Abdomen:  Soft,nontender, nondistended, normal bowel sounds, no rebound or guarding. No hepatosplenomegaly.  No obvious ascites.  No abdominal wall hernias.  No succession splash. Rectal:  Deferred  Msk:  Symmetrical. No boney deformities LAD: No inguinal or umbilical LAD Extremities:  No clubbing or edema. Neurologic:  Alert and  oriented x4;  grossly nonfocal Skin:  Intact without significant lesions or rashes. Psych:  Alert and cooperative. Normal mood and affect.  Gable Odonohue L. Tarri Glenn, MD, MPH [facility] 08/27/2019, 11:32 AM

## 2019-08-27 NOTE — Patient Instructions (Addendum)
  Call if you have any return of symptoms.   Keep a food diary and a menstruation diary.   Continue to use a daily probiotic

## 2019-10-16 ENCOUNTER — Other Ambulatory Visit: Payer: Self-pay | Admitting: Obstetrics and Gynecology

## 2019-10-16 DIAGNOSIS — Z1231 Encounter for screening mammogram for malignant neoplasm of breast: Secondary | ICD-10-CM

## 2019-10-28 ENCOUNTER — Other Ambulatory Visit: Payer: Self-pay | Admitting: Obstetrics and Gynecology

## 2019-10-28 ENCOUNTER — Other Ambulatory Visit: Payer: Self-pay | Admitting: Radiology

## 2019-10-28 DIAGNOSIS — N644 Mastodynia: Secondary | ICD-10-CM

## 2019-11-05 ENCOUNTER — Other Ambulatory Visit: Payer: Self-pay

## 2019-11-05 ENCOUNTER — Ambulatory Visit
Admission: RE | Admit: 2019-11-05 | Discharge: 2019-11-05 | Disposition: A | Discharge status: Home / Self Care | Payer: Commercial Managed Care - PPO | Source: Ambulatory Visit | Attending: Radiology | Admitting: Radiology

## 2019-11-05 DIAGNOSIS — N644 Mastodynia: Secondary | ICD-10-CM

## 2020-03-04 ENCOUNTER — Encounter: Payer: Self-pay | Admitting: Gastroenterology

## 2020-03-04 ENCOUNTER — Other Ambulatory Visit (INDEPENDENT_AMBULATORY_CARE_PROVIDER_SITE_OTHER): Payer: Commercial Managed Care - PPO

## 2020-03-04 ENCOUNTER — Telehealth (INDEPENDENT_AMBULATORY_CARE_PROVIDER_SITE_OTHER): Payer: Commercial Managed Care - PPO | Admitting: Family Medicine

## 2020-03-04 ENCOUNTER — Ambulatory Visit (INDEPENDENT_AMBULATORY_CARE_PROVIDER_SITE_OTHER): Payer: Commercial Managed Care - PPO | Admitting: Gastroenterology

## 2020-03-04 VITALS — BP 124/76 | HR 88 | Temp 97.8°F | Ht 65.0 in | Wt 193.2 lb

## 2020-03-04 DIAGNOSIS — R140 Abdominal distension (gaseous): Secondary | ICD-10-CM | POA: Diagnosis not present

## 2020-03-04 DIAGNOSIS — R194 Change in bowel habit: Secondary | ICD-10-CM | POA: Diagnosis not present

## 2020-03-04 DIAGNOSIS — J029 Acute pharyngitis, unspecified: Secondary | ICD-10-CM

## 2020-03-04 DIAGNOSIS — R1084 Generalized abdominal pain: Secondary | ICD-10-CM

## 2020-03-04 DIAGNOSIS — R0981 Nasal congestion: Secondary | ICD-10-CM

## 2020-03-04 DIAGNOSIS — R21 Rash and other nonspecific skin eruption: Secondary | ICD-10-CM

## 2020-03-04 LAB — SEDIMENTATION RATE: Sed Rate: 25 mm/hr — ABNORMAL HIGH (ref 0–20)

## 2020-03-04 LAB — C-REACTIVE PROTEIN: CRP: <1 mg/dL (ref 0.5–20.0)

## 2020-03-04 MED ORDER — DICYCLOMINE HCL 20 MG PO TABS: 20.0000 mg | ORAL_TABLET | Freq: Three times a day (TID) | ORAL | 1 refills | Status: DC

## 2020-03-04 MED ORDER — RIFAXIMIN 550 MG PO TABS: 550.0000 mg | ORAL_TABLET | Freq: Three times a day (TID) | ORAL | 0 refills | Status: DC

## 2020-03-04 NOTE — Addendum Note (Signed)
Addended by: Lucretia Kern on: 03/04/2020 03:49 PM   Modules accepted: Level of Service

## 2020-03-04 NOTE — Progress Notes (Addendum)
Referring Provider: Marylynn Pearson, MD Primary Care Physician:  Midge Minium, MD  Chief complaint: Abdominal pain, change in bowel habits   IMPRESSION:  Change in bowel habits x 4-5 months Lower abdominal pain with associated bloating, improved with dicyclomine    - occurs during the 3rd week of month    - transvaginal ultrasound 02/2019 showed no cause for symptoms     - No obvious source on CT abd/pelvis 06/02/19, EGD or colonoscopy 06/17/19    - improved with trial of dicyclomine Duodenitis and reactive gastropathy on EGD 06/17/2019    - Biopsies negative for H. Pylori    - On pantoprazole Mild cecal erythema on colonoscopy 06/17/2019    -Mildly active chronic nonspecific colitis seen on cecal biopsies, no granulomas seen    -Random colon and terminal ileum biopsies were otherwise normal Small hiatal hernia Family history of colon cancer (maternal grandfather in his 38) C Diff 2019 Patient follows a gluten-free diet due to suspected gluten sensitivity Prior cholecystectomy Colonoscopy >10 years ago in [place] Hyperplastic polyp on colonoscopy 06/17/19  Although her abdominal pain and cramping has largely resolved on Bentyl, she now has a 5 to 8-monthhistory of altered bowel habits despite daily probiotics.  Symptoms may be related to NSAIDs.  However, given her cecal patch of erythema seen on colonoscopy 06/17/2019 I recommend additional evaluation for inflammation.  We will also proceed with fecal elastase and GI pathogen panel.  I recommended a an empiric trial of Xifaxan 500 milligrams 3 times daily for 14 days symptomatic relief in the meantime.   PLAN: Continue to use Bentyl 20 mg QID PRN (30 days with 3 months) Continue daily probiotics Avoid all NSAIDs including Excedrin ESR, CRP, GI pathogen panel, fecal elastase and fecal calprotectin Xifaxan 550 mg TID x 14 days Follow-up office appointment in 4-6 weeks  HPI: Patty Gwilliamis a 49y.o. female  referred by Dr. AJulien Girtfor abdominal pain. Initially seen 02/26/19 and in follow-up 08/27/19.  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  Interval history is obtained through the patient and review of her electronic health record.  History of C. difficile 2019.  Colonoscopy 06/17/19 to evaluate abdominal pain revealed: - The perianal and digital rectal examinations were normal. - A 3 mm sessile hyperplastic polyp was found in the rectum.  - A diffuse area of very mildly erythematous mucosa was found in the cecum. Biopsies were taken with a cold forceps for histology. Estimated blood loss was minimal. - The distal ileum appeared normal except for an area of erythema and nodularity at the TI. Approximately 10 cm was examined. Biopsies were taken with a cold forceps for histology.  EGD 06/17/19 to evaluate abdominal pain revealed:  - Normal esophagus. Biopsied. - Gastropathy in the gastric body and in the gastric antrum. Biopsies were taken with a cold forceps for histology. - The examined duodenum was normal. Biopsies were taken with a cold forceps for histology. - Small hiatal hernia.   Pathology results from her recent endoscopic evaluation:  1. 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 SEE NOTE 2. Surgical [P], gastric - GASTRIC ANTRAL MUCOSA WITH MILD NONSPECIFIC REACTIVE GASTROPATHY - GASTRIC OXYNTIC MUCOSA WITH NO SPECIFIC HISTOPATHOLOGIC CHANGES - WARTHIN STARRY STAIN IS NEGATIVE FOR HELICOBACTER PYLORI 3. Surgical [P], distal esophagus - ESOPHAGEAL SQUAMOUS MUCOSA WITH MILD VASCULAR CONGESTION, AND FOCAL SQUAMOUS BALLOONING, SUGGESTIVE OF REFLUX ESOPHAGITIS - NEGATIVE FOR  INCREASED INTRAEPITHELIAL EOSINOPHILS 4. Surgical [P], mid/proximal esophagus - ESOPHAGEAL SQUAMOUS MUCOSA WITH MILD VASCULAR CONGESTION, AND FOCAL SQUAMOUS BALLOONING, SUGGESTIVE OF REFLUX ESOPHAGITIS - NEGATIVE FOR INCREASED  INTRAEPITHELIAL EOSINOPHILS - PAS/F STAIN IS NEGATIVE FOR FUNGAL ORGANISMS 5. Surgical [P], colon, cecum - MILDLY ACTIVE CHRONIC NONSPECIFIC COLITIS. SEE NOTE - NEGATIVE FOR GRANULOMAS 6. Surgical [P], small bowel, terminal ileum - ILEAL MUCOSA WITH NO SPECIFIC HISTOPATHOLOGIC CHANGES - NEGATIVE FOR ACUTE INFLAMMATION, FEATURES OF CHRONICITY OR GRANULOMAS  Abdominal cramping largely improved with dicyclomine. In the past, the pain was primarily localized to the right lower quadrant and had associated abdominal cramping,  bloating and gas. Described as a "tearing in my stomach" and "kicked in the gut."   However, for the last 5 months despite increased fiber/Metamucil, she finds that her stools are the consistency of tooth paste. Frequent bowel movements, up to hourly. Frequent postprandial defecation 30 minutes after eating. Sense of incomplete evacuation. Requiring wet wipes. Mucuous initially, but no blood or mucous in the stool now. Initially thought the stools could be oily but no longer feels that way.  Symptoms started at the start of metformin. Other new symptoms improved, but her stools have not returned to normal despite discontinuation of metformin.  Continues on daily Plexus probiotic. Uses some Excedrin for headaches.   I have personally reviewed the images from her CT of the abdomen and pelvis with contrast 06/02/2019.  It shows no source for her abdominal pain.  There is nonobstructive left nephrolithiasis. There has been no subsequent GI imaging.  Past Medical History:  Diagnosis Date  . Dysmenorrhea   . Endometriosis   . Family history of breast cancer   . Gluten intolerance   . 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 Hyperlipidemia   . Hypothyroidism   . Menorrhagia   . PCOD (polycystic ovarian disease)   . PONV (postoperative nausea and vomiting)   . Sleep pattern disturbance    TRAUMATIC CLOSED HEAD INJURY RESIDUAL IN  1996--  TAKES TRAZADONE    Past Surgical History:  Procedure Laterality Date  . ANTERIOR CERVICAL DECOMP/DISCECTOMY FUSION  2000   C4 -- C5  . BILATERAL SALPINGECTOMY Bilateral 03/15/2015   Procedure: BILATERAL SALPINGECTOMY;  Surgeon: Marylynn Pearson, MD;  Location: [facility];  Service: Gynecology;  Laterality: Bilateral;  . BREAST EXCISIONAL BIOPSY Left 2017   benign  . BREAST EXCISIONAL BIOPSY Left 2008   benign  . CHOLECYSTECTOMY  2004  ([place], [place])  . DX LAPAROSOCPY FOR PELVIC PAIN/   REPAIR UMBILICAL HERNIA WITH MESH  06-13-2012  . EXICISION AXILLARY  MASS Left 11-15-2012  . LAPAROSCOPIC APPENDECTOMY N/A 10/26/2015   Procedure: APPENDECTOMY LAPAROSCOPIC;  Surgeon: Erroll Luna, MD;  Location: [place];  Service: General;  Laterality: N/A;  . LAPAROSCOPIC ASSISTED VAGINAL HYSTERECTOMY N/A 03/15/2015   Procedure: LAPAROSCOPIC ASSISTED VAGINAL HYSTERECTOMY;  Surgeon: Marylynn Pearson, MD;  Location: [place];  Service: Gynecology;  Laterality: N/A;  . LAPAROSCOPY  x2  last one 2011 [facility])   endometriosis  . LAPAROSCOPY N/A 10/26/2015   Procedure: LAPAROSCOPY DIAGNOSTIC;  Surgeon: Erroll Luna, MD;  Location: [place];  Service: General;  Laterality: N/A;  . MANDIBLE FRACTURE SURGERY Bilateral 1998   2000-- [place]   . REMOVAL BENIGN MASTOID TUMOR RIGHT EAR  2007    Current Outpatient Medications  Medication Sig Dispense Refill  . aspirin-acetaminophen-caffeine (EXCEDRIN MIGRAINE) 250-250-65 MG tablet  Take 1 tablet by mouth every 6 (six) hours as needed for headache.    . dicyclomine (BENTYL) 20 MG tablet Take 1 tablet (20 mg total) by mouth 4 (four) times daily -  before meals and at bedtime. (Patient taking differently: Take 20 mg by mouth as needed. ) 360 tablet 1  . Multiple Vitamins-Minerals (MULTIVITAMIN WITH MINERALS) tablet Take 1 tablet by mouth daily.    . pantoprazole (PROTONIX) 40 MG tablet Take 1 tablet (40 mg  total) by mouth daily. 90 tablet 3  . pregabalin (LYRICA) 25 MG capsule Take 25 mg by mouth daily.    Marland Kitchen thyroid (ARMOUR) 120 MG tablet Take 120 mg by mouth daily.    . traZODone (DESYREL) 50 MG tablet Take 50 mg by mouth at bedtime.    Marland Kitchen EPIPEN 2-PAK 0.3 MG/0.3ML SOAJ injection See admin instructions.  0   No current facility-administered medications for this visit.    Allergies as of 03/04/2020 - Review Complete 03/04/2020  Allergen Reaction Noted  . Ciprofloxacin Anaphylaxis, Shortness Of Breath, and Swelling 05/15/2012  . Codeine Shortness Of Breath and Swelling 05/15/2012  . Morphine and related Shortness Of Breath, Swelling, and Other (See Comments) 05/15/2012  . Shellfish allergy Anaphylaxis 03/10/2015  . Other  03/10/2015  . Adhesive [tape] Rash 06/14/2012    Family History  Problem Relation Age of Onset  . Cancer Father 10       melanoma - retinal  . Heart disease Maternal Aunt   . Heart disease Maternal Uncle   . Cancer Paternal Aunt 63       breast cancer- reportedly BRCA+ but report not available at present time  . Cancer Paternal Grandfather 11       prostate  . Heart attack Paternal Grandfather   . Cancer Other        reportedly has 2nd cousin on maternal side that is also BRCA+ but no report available  . Heart disease Maternal Aunt   . Heart disease Maternal Aunt     Social History   Socioeconomic History  . Marital status: Married    Spouse name: Not on file  . Number of children: 2  . Years of education: Not on file  . Highest education level: Not on file  Occupational History  . Occupation: homemaker  Tobacco Use  . Smoking status: Never Smoker  . Smokeless tobacco: Never Used  Substance and Sexual Activity  . Alcohol use: No  . Drug use: No  . Sexual activity: Yes  Other Topics Concern  . Not on file  Social History Narrative  . Not on file   Social Determinants of Health   Financial Resource Strain:   . Difficulty of Paying Living  Expenses:   Food Insecurity:   . Worried About Charity fundraiser in the Last Year:   . Arboriculturist in the Last Year:   Transportation Needs:   . Film/video editor (Medical):   Marland Kitchen Lack of Transportation (Non-Medical):   Physical Activity:   . Days of Exercise per Week:   . Minutes of Exercise per Session:   Stress:   . Feeling of Stress :   Social Connections:   . Frequency of Communication with Friends and Family:   . Frequency of Social Gatherings with Friends and Family:   . Attends Religious Services:   . Active Member of Clubs or Organizations:   . Attends Archivist Meetings:   Marland Kitchen Marital Status:  Intimate Partner Violence:   . Fear of Current or Ex-Partner:   . Emotionally Abused:   Marland Kitchen Physically Abused:   . Sexually Abused:     Physical Exam: General:   Alert,  well-nourished, pleasant and cooperative in NAD.  Eyes are open. Head:  Normocephalic and atraumatic. Eyes:  Sclera clear, no icterus.   Conjunctiva pink. Ears:  Normal auditory acuity. Nose:  No deformity, discharge,  or lesions. Mouth:  No deformity or lesions.   Neck:  Supple; no masses or thyromegaly. Lungs:  Clear throughout to auscultation.   No wheezes. Heart:  Regular rate and rhythm; no murmurs. Abdomen:  Soft,nontender, nondistended, normal bowel sounds, no rebound or guarding. No hepatosplenomegaly.  No obvious ascites.  No abdominal wall hernias.  No succession splash. Rectal:  Deferred  Msk:  Symmetrical. No boney deformities LAD: No inguinal or umbilical LAD Extremities:  No clubbing or edema. Neurologic:  Alert and  oriented x4;  grossly nonfocal Skin:  Intact without significant lesions or rashes. Psych:  Alert and cooperative. Normal mood and affect.  Kinta Martis L. Tarri Glenn, MD, MPH [facility] 03/04/2020, 9:44 AM

## 2020-03-04 NOTE — Patient Instructions (Addendum)
Your provider has requested that you go to the basement level for lab work before leaving today. Press "B" on the elevator. The lab is located at the first door on the left as you exit the elevator.  Continue to use Bentyl 20 mg before meals and at bedtime as needed.   Avoid all NSAIDs including Excedrin.   We have sent your demographic information and a prescription for Xifaxan to [facility]. This pharmacy is able to get medication approved through insurance and get you the lowest copay possible. If you have not heard from them within 1 week, please call our office at [phone] to let us know.  I value your feedback and thank you for entrusting [place] with your care. If you get a [facility] patient survey, I would appreciate you taking the time to let us know about your experience today. Thank you!

## 2020-03-04 NOTE — Progress Notes (Signed)
Virtual Visit via Video Note  I connected with Patty Ruiz  on 03/04/20 at 10:40 AM EDT by a video enabled telemedicine application and verified that I am speaking with the correct person using two identifiers.  Location patient: home, [place] Location provider:work or home office Persons participating in the virtual visit: patient, provider  I discussed the limitations of evaluation and management by telemedicine and the availability of in person appointments. The patient expressed understanding and agreed to proceed.   HPI:  Acute visit for rash on hands and feet: -started today -little blisters or bumps on her hands and feet when she got out of the shower, no lesions elsewhere -has had a little drainage in throat/mildly sore for about 5 days, but she felt fine otherwise; thought was allergies as has some sinus congestion -kids have been fine -she can not think of any new exposures or contact exposures to hands and feet -husband has been sick and run down all week and thought was from the [place] shot they both got last weekend -multiple family members do interact with others outside of the house,  kids attend school  -no fevers, SOB, GI symptoms, loss of taste or smell, cough -had COVID19 shot last week and felt tired and achy for 24 hours and then felt fine  ROS: See pertinent positives and negatives per HPI.  Past Medical History:  Diagnosis Date  . Dysmenorrhea   . Endometriosis   . Family history of breast cancer   . Gluten intolerance   . 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 Hyperlipidemia   . Hypothyroidism   . Menorrhagia   . PCOD (polycystic ovarian disease)   . PONV (postoperative nausea and vomiting)   . 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 ANTERIOR CERVICAL DECOMP/DISCECTOMY FUSION  2000   C4 -- C5  .  BILATERAL SALPINGECTOMY Bilateral 03/15/2015   Procedure: BILATERAL SALPINGECTOMY;  Surgeon: Marylynn Pearson, MD;  Location: [facility];  Service: Gynecology;  Laterality: Bilateral;  . BREAST EXCISIONAL BIOPSY Left 2017   benign  . BREAST EXCISIONAL BIOPSY Left 2008   benign  . CHOLECYSTECTOMY  2004  ([place], [place])  . DX LAPAROSOCPY FOR PELVIC PAIN/   REPAIR UMBILICAL HERNIA WITH MESH  06-13-2012  . EXICISION AXILLARY  MASS Left 11-15-2012  . LAPAROSCOPIC APPENDECTOMY N/A 10/26/2015   Procedure: APPENDECTOMY LAPAROSCOPIC;  Surgeon: Erroll Luna, MD;  Location: [place];  Service: General;  Laterality: N/A;  . LAPAROSCOPIC ASSISTED VAGINAL HYSTERECTOMY N/A 03/15/2015   Procedure: LAPAROSCOPIC ASSISTED VAGINAL HYSTERECTOMY;  Surgeon: Marylynn Pearson, MD;  Location: [place];  Service: Gynecology;  Laterality: N/A;  . LAPAROSCOPY  x2  last one 2011 [facility])   endometriosis  . LAPAROSCOPY N/A 10/26/2015   Procedure: LAPAROSCOPY DIAGNOSTIC;  Surgeon: Erroll Luna, MD;  Location: [place];  Service: General;  Laterality: N/A;  . MANDIBLE FRACTURE SURGERY Bilateral 1998   2000-- [place]   . REMOVAL BENIGN MASTOID TUMOR RIGHT EAR  2007    Family History  Problem Relation Age of Onset  . Cancer Father 19       melanoma - retinal  . Heart disease Maternal Aunt   . Heart disease Maternal Uncle   . Cancer Paternal Aunt 33       breast  cancer- reportedly BRCA+ but report not available at present time  . Cancer Paternal Grandfather 45       prostate  . Heart attack Paternal Grandfather   . Cancer Other        reportedly has 2nd cousin on maternal side that is also BRCA+ but no report available  . Heart disease Maternal Aunt   . Heart disease Maternal Aunt     SOCIAL HX: see hpi   Current Outpatient Medications:  .  aspirin-acetaminophen-caffeine (EXCEDRIN MIGRAINE) 250-250-65 MG tablet, Take 1 tablet by mouth every 6 (six) hours as  needed for headache., Disp: , Rfl:  .  dicyclomine (BENTYL) 20 MG tablet, Take 1 tablet (20 mg total) by mouth 4 (four) times daily -  before meals and at bedtime., Disp: 360 tablet, Rfl: 1 .  EPIPEN 2-PAK 0.3 MG/0.3ML SOAJ injection, See admin instructions., Disp: , Rfl: 0 .  Multiple Vitamins-Minerals (MULTIVITAMIN WITH MINERALS) tablet, Take 1 tablet by mouth daily., Disp: , Rfl:  .  pantoprazole (PROTONIX) 40 MG tablet, Take 1 tablet (40 mg total) by mouth daily., Disp: 90 tablet, Rfl: 3 .  pregabalin (LYRICA) 25 MG capsule, Take 25 mg by mouth daily., Disp: , Rfl:  .  rifaximin (XIFAXAN) 550 MG TABS tablet, Take 1 tablet (550 mg total) by mouth 3 (three) times daily., Disp: 42 tablet, Rfl: 0 .  thyroid (ARMOUR) 120 MG tablet, Take 120 mg by mouth daily., Disp: , Rfl:  .  traZODone (DESYREL) 50 MG tablet, Take 50 mg by mouth at bedtime., Disp: , Rfl:   EXAM:  VITALS per patient if applicable:  GENERAL: alert, oriented, appears well and in no acute distress  HEENT: atraumatic, conjunttiva clear, no obvious abnormalities on inspection of external nose and ears  NECK: normal movements of the head and neck  LUNGS: on inspection no signs of respiratory distress, breathing rate appears normal, no obvious gross SOB, gasping or wheezing  CV: no obvious cyanosis  MS: moves all visible extremities without noticeable abnormality  SKIN: small erythematous papules on the hands and feet only  PSYCH/NEURO: pleasant and cooperative, no obvious depression or anxiety, speech and thought processing grossly intact  ASSESSMENT AND PLAN:  Discussed the following assessment and plan:  Rash  Sinus congestion  Sore throat  -we discussed possible serious and likely etiologies, options for evaluation and workup, limitations of telemedicine visit vs in person visit, treatment, treatment risks and precautions. Pt prefers to treat via telemedicine empirically rather then risking or undertaking an in  person visit at this moment. Query viral infection such as HFM vs vaccine side effects (though less likely), COVID19 vs other.Opted for symptomatic care, COVID19 testing options discussed, staying home until better for 24 hours (10 days Plus if COVID19 +) and using precautions after such as masking, distancing, hygeine, etc. Patient agrees to seek prompt in person care if worsening, new symptoms arise, or if is not improving with treatment.    I discussed the assessment and treatment plan with the patient. The patient was provided an opportunity to ask questions and all were answered. The patient agreed with the plan and demonstrated an understanding of the instructions.   The patient was advised to call back or seek an in-person evaluation if the symptoms worsen or if the condition fails to improve as anticipated.   Lucretia Kern, DO

## 2020-03-04 NOTE — Patient Instructions (Signed)
I included some information regarding hand foot an mouth below. This is one possible cause of the symptoms you are having.   Please consider COVID19 testing as we discussed.  Please stay home until symptoms have resolved for at least 24 hours. With most viruses you are most contagious during the first week. With COVID10 it is recommended that you stay home for 10 days from the onset of symptoms.  I hope you are feeling better soon! Seek care promptly if your symptoms worsen, new concerns arise or you are not improving with treatment.      Hand, Foot, and Mouth Disease, Adult Hand, foot, and mouth disease is a common viral illness. It happens mainly in children who are younger than 27 years old, but adolescents and adults may also get it. The illness often causes:  Sore throat.  Sores in the mouth.  Fever.  Rash on the hands and feet. Usually, this condition is not serious. Most people get better within 1-2 weeks. What are the causes? This condition is usually caused by a group of viruses called enteroviruses. The disease can spread from person to person (is contagious). A person is most contagious during the first week of the illness. The infection spreads through direct contact with:  Nose discharge of an infected person.  Throat discharge of an infected person.  Stool (feces) of an infected person. What are the signs or symptoms? Symptoms of this condition include:  Small sores in the mouth. These may cause pain.  A rash on the hands and feet, and sometimes on the buttocks. The rash may also occur on the arms, legs, or other areas of the body. The rash may look like small red bumps or sores and may have blisters.  Fever.  Body aches or headaches.  Irritability.  Decreased appetite. How is this diagnosed? This condition can usually be diagnosed with a physical exam in which your health care provider will look at your rash and mouth sores. Tests are usually not needed.  In some cases, a stool (feces) sample or a throat swab may be taken to check for the virus or for other infections. How is this treated? In most cases, no treatment is needed. People usually get better within 2 weeks without treatment. To help relieve pain or fever, your health care provider may recommend over-the-counter medicines such as ibuprofen or acetaminophen. To help relieve discomfort from mouth sores, your health care provider may recommend using:  Solutions that are rinsed in the mouth.  Pain-relieving gel that is applied to the sores (topical gel).  Antacid medicine. Follow these instructions at home: Managing pain and discomfort  Rinse your mouth with a salt-water mixture 3-4 times a day or as needed. To make a salt-water mixture, completely dissolve -1 tsp of salt in 1 cup of warm water. This can help to reduce pain from the mouth sores. Your health care provider may also recommend other rinse solutions to treat mouth sores.  To relieve discomfort when you are eating: ? Try combinations of foods to see what you can tolerate. Aim for a balanced diet. ? Eat soft foods. These may be easier to swallow. ? Avoid foods and drinks that are salty, spicy, or acidic. ? Avoid alcohol. ? Try cold food and drinks, such as water, milk, milkshakes, frozen ice pops, slushies, and sherbets. Low-calorie sport drinks are good choices for staying hydrated. General instructions   Return to your normal activities as told by your health care provider. Ask  your health care provider what activities are safe for you.  Take or apply over-the-counter and prescription medicines only as told by your health care provider.  Wash your hands often with soap and water. If soap and water are not available, use hand sanitizer.  Stay away from work, schools, or other group settings during the first few days of the illness, or until your fever is gone.  Keep all follow-up visits as told by your health care  provider. This is important. Contact a health care provider if:  Your symptoms get worse or do not improve within 2 weeks.  You have pain that does not get better with medicine.  You feel very irritable.  You have trouble swallowing.  You develop sores or blisters on your lips or outside of your mouth.  You have a fever for more than 3 days. Get help right away if:  You develop signs of severe dehydration, such as: ? Decreased urination. This means urinating only very small amounts or urinating fewer than 3 times in a 24-hour period. ? Urine that is very dark. ? Dry mouth, tongue, or lips. ? Decreased tears or sunken eyes. ? Dry skin. ? Rapid breathing. ? Decreased activity or being very sleepy. ? Pale skin. ? Fingertips taking longer than 2 seconds to turn pink after a gentle squeeze. ? Weight loss.  You have a severe headache.  You have a stiff neck.  You experience changes in your behavior.  You have chest pain.  You have trouble breathing. Summary  Hand, foot, and mouth disease is a common viral illness.  This disease can spread from person to person (is contagious).  The illness often causes a sore throat, sores in the mouth, fever, and a rash on the hands and feet.  Typically, no treatment is needed for this condition. People usually get better within 2 weeks without treatment.  Get help right away if you develop signs of severe dehydration. This information is not intended to replace advice given to you by your health care provider. Make sure you discuss any questions you have with your health care provider. Document Revised: 03/13/2019 Document Reviewed: 01/08/2017 Elsevier Patient Education  [place].

## 2020-03-09 ENCOUNTER — Telehealth: Payer: Self-pay | Admitting: Gastroenterology

## 2020-03-09 NOTE — Telephone Encounter (Signed)
I would wait one week after completing prednisone for the stools studies.  Xifaxan should not decrease the efficacy of the Covid vaccine.   Thanks.  KLB

## 2020-03-09 NOTE — Telephone Encounter (Signed)
Pt aware of Dr. Tarri Glenn recommendations.

## 2020-03-09 NOTE — Telephone Encounter (Signed)
Pt has several stool studies left to complete-fecal calprotectin, gi path panel, and pancreatic elastase. She had an allergic reaction and was put on prednisone and will finish it tomorrow. She wanted to know if she needs to wait a certain amount of time before giving stool specimen due to taking the prednisone.  She also wants to know if she should wait to start the xifaxan until after she has her 2nd covid vaccine. She does not want to do anything to lower her immune system and she is already concerned about having been on the prednisone. Please advise.

## 2020-03-16 DIAGNOSIS — E282 Polycystic ovarian syndrome: Secondary | ICD-10-CM | POA: Insufficient documentation

## 2020-03-18 ENCOUNTER — Other Ambulatory Visit: Payer: Commercial Managed Care - PPO

## 2020-03-18 DIAGNOSIS — R140 Abdominal distension (gaseous): Secondary | ICD-10-CM

## 2020-03-18 DIAGNOSIS — R1084 Generalized abdominal pain: Secondary | ICD-10-CM

## 2020-03-18 DIAGNOSIS — R194 Change in bowel habit: Secondary | ICD-10-CM

## 2020-03-23 LAB — GI PROFILE, STOOL, PCR

## 2020-03-23 LAB — CALPROTECTIN, FECAL: Calprotectin, Fecal: 34 ug/g (ref 0–120)

## 2020-03-25 LAB — PANCREATIC ELASTASE, FECAL: Pancreatic Elastase-1, Stool: >500 mcg/g

## 2020-04-16 ENCOUNTER — Ambulatory Visit (INDEPENDENT_AMBULATORY_CARE_PROVIDER_SITE_OTHER): Payer: Commercial Managed Care - PPO | Admitting: Gastroenterology

## 2020-04-16 ENCOUNTER — Encounter: Payer: Self-pay | Admitting: Gastroenterology

## 2020-04-16 VITALS — BP 124/84 | HR 82 | Temp 97.9°F | Ht 65.0 in | Wt 195.8 lb

## 2020-04-16 DIAGNOSIS — R194 Change in bowel habit: Secondary | ICD-10-CM

## 2020-04-16 DIAGNOSIS — R1084 Generalized abdominal pain: Secondary | ICD-10-CM | POA: Diagnosis not present

## 2020-04-16 DIAGNOSIS — R140 Abdominal distension (gaseous): Secondary | ICD-10-CM

## 2020-04-16 MED ORDER — SULFAMETHOXAZOLE-TRIMETHOPRIM 800-160 MG PO TABS: 1.0000 | ORAL_TABLET | Freq: Two times a day (BID) | ORAL | 0 refills | Status: DC

## 2020-04-16 NOTE — Progress Notes (Signed)
Referring Provider: Marylynn Pearson, MD Primary Care Physician:  Midge Minium, MD  Chief complaint: Abdominal pain, change in bowel habits   IMPRESSION:  Change in bowel habits x 5-6 months    -GI pathogen panel negative, pancreatic elastase normal, CRP negative, ESR 25 Lower abdominal pain with associated bloating, improved with dicyclomine    - occurs during the 3rd week of month    - transvaginal ultrasound 02/2019 showed no cause for symptoms     - No obvious source on CT abd/pelvis 06/02/19, EGD or colonoscopy 06/17/19    - improved with trial of dicyclomine, now with returning increase in frequency Duodenitis and reactive gastropathy on EGD 06/17/2019    - Biopsies negative for H. Pylori    - On pantoprazole Mild cecal erythema on colonoscopy 06/17/2019    -Mildly active chronic nonspecific colitis seen on cecal biopsies, no granulomas seen    -Random colon and terminal ileum biopsies were otherwise normal    -Normal fecal calprotectin at 34/15/21 Small hiatal hernia Family history of colon cancer (maternal grandfather in his 26) C Diff 2019 Patient follows a gluten-free diet due to suspected gluten sensitivity Prior cholecystectomy Colonoscopy >10 years ago in [place] Hyperplastic polyp on colonoscopy 06/17/19  Although her abdominal pain and cramping has largely resolved on Bentyl, she continues to have altered bowel habits despite daily probiotics.  Symptoms may be related to NSAIDs.  Recent stools and blood studies make intestinal inflammation less likely.  Will proceed with trial of empiric antibiotics for possible bacterial overgrowth.     PLAN: Septa DS BID x 14 days Follow-up office appointment in 4-6 weeks  HPI: Patty Ruiz is a 49 y.o. female referred by Dr. Julien Girt for abdominal pain. Initially seen 02/26/19 and in follow-up 08/27/19.  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  Interval history is obtained through  the patient and review of her electronic health record.  History of C. difficile 2019.  Colonoscopy 06/17/19 to evaluate abdominal pain revealed: - A 3 mm sessile hyperplastic polyp was found in the rectum.  - A diffuse area of very mildly erythematous mucosa was found in the cecum. Biopsies were taken with a cold forceps for histology. Estimated blood loss was minimal. - The distal ileum appeared normal except for an area of erythema and nodularity at the TI. Approximately 10 cm was examined. Biopsies were taken with a cold forceps for histology.  EGD 06/17/19 to evaluate abdominal pain revealed:  - Normal esophagus. Biopsied. - Gastropathy in the gastric body and in the gastric antrum. Biopsies were taken with a cold forceps for histology. - The examined duodenum was normal. Biopsies were taken with a cold forceps for histology. - Small hiatal hernia.   Pathology results from her recent endoscopic evaluation:  1. 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 SEE NOTE 2. Surgical [P], gastric - GASTRIC ANTRAL MUCOSA WITH MILD NONSPECIFIC REACTIVE GASTROPATHY - GASTRIC OXYNTIC MUCOSA WITH NO SPECIFIC HISTOPATHOLOGIC CHANGES - WARTHIN STARRY STAIN IS NEGATIVE FOR HELICOBACTER PYLORI 3. Surgical [P], distal esophagus - ESOPHAGEAL SQUAMOUS MUCOSA WITH MILD VASCULAR CONGESTION, AND FOCAL SQUAMOUS BALLOONING, SUGGESTIVE OF REFLUX ESOPHAGITIS - NEGATIVE FOR INCREASED INTRAEPITHELIAL EOSINOPHILS 4. Surgical [P], mid/proximal esophagus - ESOPHAGEAL SQUAMOUS MUCOSA WITH MILD VASCULAR CONGESTION, AND FOCAL SQUAMOUS BALLOONING, SUGGESTIVE OF REFLUX ESOPHAGITIS - NEGATIVE FOR INCREASED INTRAEPITHELIAL EOSINOPHILS - PAS/F STAIN IS NEGATIVE FOR FUNGAL ORGANISMS 5. Surgical [P], colon, cecum - MILDLY  ACTIVE CHRONIC NONSPECIFIC COLITIS. SEE NOTE - NEGATIVE FOR GRANULOMAS 6. Surgical [P], small bowel, terminal ileum - ILEAL MUCOSA WITH NO SPECIFIC  HISTOPATHOLOGIC CHANGES - NEGATIVE FOR ACUTE INFLAMMATION, FEATURES OF CHRONICITY OR GRANULOMAS  Labs in April 2021 showed a normal stool pathogen panel, normal fecal calprotectin to 34, normal pancreatic elastase, CRP less than 1, and sedimentation rate 25.  Abdominal cramping had largely improved with dicyclomine, but has recently started to occur again and in increasing frequency. In the past, the pain was primarily localized to the right lower quadrant and had associated abdominal cramping,  bloating and gas. Described as a "tearing in my stomach" and "kicked in the gut."   For the last 6+ months despite increased fiber/Metamucil, she finds that her stools are the consistency of tooth paste. Frequent bowel movements, up to hourly. Frequent postprandial defecation 30 minutes after eating. Sense of incomplete evacuation. Requiring wet wipes. Mucuous initially, but no blood or mucous in the stool now. Initially thought the stools could be oily but no longer feels that way.  Symptoms started at the start of metformin. Other new symptoms improved, but her stools have not returned to normal despite discontinuation of metformin.  Continues on daily Plexus probiotic. Uses some Excedrin for headaches.  Did not fill the Xifaxan as recommended at the time of her last office visit 03/04/20  because it was a $400 co-pay.    I have personally reviewed the images from her CT of the abdomen and pelvis with contrast 06/02/2019.  It shows no source for her abdominal pain.  There is nonobstructive left nephrolithiasis. There has been no subsequent GI imaging.  Past Medical History:  Diagnosis Date  . Dysmenorrhea   . Endometriosis   . Family history of breast cancer   . Gluten intolerance   . 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 Hyperlipidemia   . Hypothyroidism   . Menorrhagia   . PCOD (polycystic ovarian disease)   . PONV (postoperative nausea and  vomiting)   . 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 ANTERIOR CERVICAL DECOMP/DISCECTOMY FUSION  2000   C4 -- C5  . BILATERAL SALPINGECTOMY Bilateral 03/15/2015   Procedure: BILATERAL SALPINGECTOMY;  Surgeon: Marylynn Pearson, MD;  Location: [facility];  Service: Gynecology;  Laterality: Bilateral;  . BREAST EXCISIONAL BIOPSY Left 2017   benign  . BREAST EXCISIONAL BIOPSY Left 2008   benign  . CHOLECYSTECTOMY  2004  ([place], [place])  . DX LAPAROSOCPY FOR PELVIC PAIN/   REPAIR UMBILICAL HERNIA WITH MESH  06-13-2012  . EXICISION AXILLARY  MASS Left 11-15-2012  . LAPAROSCOPIC APPENDECTOMY N/A 10/26/2015   Procedure: APPENDECTOMY LAPAROSCOPIC;  Surgeon: Erroll Luna, MD;  Location: [facility];  Service: General;  Laterality: N/A;  . LAPAROSCOPIC ASSISTED VAGINAL HYSTERECTOMY N/A 03/15/2015   Procedure: LAPAROSCOPIC ASSISTED VAGINAL HYSTERECTOMY;  Surgeon: Marylynn Pearson, MD;  Location: [facility];  Service: Gynecology;  Laterality: N/A;  . LAPAROSCOPY  x2  last one 2011 [facility])   endometriosis  . LAPAROSCOPY N/A 10/26/2015   Procedure: LAPAROSCOPY DIAGNOSTIC;  Surgeon: Erroll Luna, MD;  Location: [facility];  Service: General;  Laterality: N/A;  . MANDIBLE FRACTURE SURGERY Bilateral 1998   2000-- [place]   . REMOVAL BENIGN MASTOID TUMOR RIGHT EAR  2007  Current Outpatient Medications  Medication Sig Dispense Refill  . aspirin-acetaminophen-caffeine (EXCEDRIN MIGRAINE) 250-250-65 MG tablet Take 1 tablet by mouth every 6 (six) hours as needed for headache.    . dicyclomine (BENTYL) 20 MG tablet Take 1 tablet (20 mg total) by mouth 4 (four) times daily -  before meals and at bedtime. 360 tablet 1  . EPIPEN 2-PAK 0.3 MG/0.3ML SOAJ injection See admin instructions.  0  . Multiple Vitamins-Minerals (MULTIVITAMIN WITH MINERALS) tablet  Take 1 tablet by mouth daily.    . pantoprazole (PROTONIX) 40 MG tablet Take 1 tablet (40 mg total) by mouth daily. 90 tablet 3  . pregabalin (LYRICA) 25 MG capsule Take 25 mg by mouth daily.    . thyroid (ARMOUR) 120 MG tablet Take 120 mg by mouth daily.    . traZODone (DESYREL) 50 MG tablet Take 50 mg by mouth at bedtime.     No current facility-administered medications for this visit.    Allergies as of 04/16/2020 - Review Complete 04/16/2020  Allergen Reaction Noted  . Ciprofloxacin Anaphylaxis, Shortness Of Breath, and Swelling 05/15/2012  . Codeine Shortness Of Breath and Swelling 05/15/2012  . Morphine and related Shortness Of Breath, Swelling, and Other (See Comments) 05/15/2012  . Shellfish allergy Anaphylaxis 03/10/2015  . Other  03/10/2015  . Adhesive [tape] Rash 06/14/2012    Family History  Problem Relation Age of Onset  . Cancer Father 57       melanoma - retinal  . Heart disease Maternal Aunt   . Heart disease Maternal Uncle   . Cancer Paternal Aunt 53       breast cancer- reportedly BRCA+ but report not available at present time  . Cancer Paternal Grandfather 75       prostate  . Heart attack Paternal Grandfather   . Cancer Other        reportedly has 2nd cousin on maternal side that is also BRCA+ but no report available  . Heart disease Maternal Aunt   . Heart disease Maternal Aunt   . Stomach cancer Neg Hx   . Esophageal cancer Neg Hx     Social History   Socioeconomic History  . Marital status: Married    Spouse name: Not on file  . Number of children: 2  . Years of education: Not on file  . Highest education level: Not on file  Occupational History  . Occupation: homemaker  Tobacco Use  . Smoking status: Never Smoker  . Smokeless tobacco: Never Used  Substance and Sexual Activity  . Alcohol use: No  . Drug use: No  . Sexual activity: Yes  Other Topics Concern  . Not on file  Social History Narrative  . Not on file   Social Determinants  of Health   Financial Resource Strain:   . Difficulty of Paying Living Expenses:   Food Insecurity:   . Worried About Running Out of Food in the Last Year:   . Ran Out of Food in the Last Year:   Transportation Needs:   . Lack of Transportation (Medical):   . Lack of Transportation (Non-Medical):   Physical Activity:   . Days of Exercise per Week:   . Minutes of Exercise per Session:   Stress:   . Feeling of Stress :   Social Connections:   . Frequency of Communication with Friends and Family:   . Frequency of Social Gatherings with Friends and Family:   . Attends Religious Services:   . Active   Member of Clubs or Organizations:   . Attends Club or Organization Meetings:   . Marital Status:   Intimate Partner Violence:   . Fear of Current or Ex-Partner:   . Emotionally Abused:   . Physically Abused:   . Sexually Abused:     Physical Exam: General: Awake, alert, and oriented, and well communicative. In no acute distress.  Pulm: No labored breathing, speaking in full sentences without conversational dyspnea  Psych: Pleasant, cooperative, normal speech, normal affect and normal insight Neuro: Alert and appropriate      Kimberly L. Beavers, MD, MPH [facility] 04/16/2020, 11:34 AM        

## 2020-04-16 NOTE — Patient Instructions (Signed)
If you are age 48 or older, your body mass index should be between 23-30. Your Body mass index is 32.58 kg/m. If this is out of the aforementioned range listed, please consider follow up with your Primary Care Provider.  If you are age 51 or younger, your body mass index should be between 19-25. Your Body mass index is 32.58 kg/m. If this is out of the aformentioned range listed, please consider follow up with your Primary Care Provider.   Thank you for trusting me with your gastrointestinal care!    Thornton Park, MD, MPH

## 2020-05-01 ENCOUNTER — Emergency Department (HOSPITAL_COMMUNITY)
Admission: EM | Admit: 2020-05-01 | Discharge: 2020-05-02 | Disposition: A | Discharge status: Home / Self Care | Payer: Commercial Managed Care - PPO | Attending: Emergency Medicine | Admitting: Emergency Medicine

## 2020-05-01 ENCOUNTER — Encounter (HOSPITAL_COMMUNITY): Payer: Self-pay | Admitting: *Deleted

## 2020-05-01 ENCOUNTER — Emergency Department (HOSPITAL_COMMUNITY): Payer: Commercial Managed Care - PPO

## 2020-05-01 ENCOUNTER — Other Ambulatory Visit: Payer: Self-pay

## 2020-05-01 DIAGNOSIS — R079 Chest pain, unspecified: Secondary | ICD-10-CM

## 2020-05-01 DIAGNOSIS — E039 Hypothyroidism, unspecified: Secondary | ICD-10-CM | POA: Insufficient documentation

## 2020-05-01 DIAGNOSIS — Z79899 Other long term (current) drug therapy: Secondary | ICD-10-CM | POA: Insufficient documentation

## 2020-05-01 DIAGNOSIS — R0789 Other chest pain: Secondary | ICD-10-CM | POA: Diagnosis not present

## 2020-05-01 LAB — CBC
HCT: 42.4 % (ref 36.0–46.0)
Hemoglobin: 13.8 g/dL (ref 12.0–15.0)
MCH: 27.3 pg (ref 26.0–34.0)
MCHC: 32.5 g/dL (ref 30.0–36.0)
MCV: 84 fL (ref 80.0–100.0)
Platelets: 356 10*3/uL (ref 150–400)
RBC: 5.05 MIL/uL (ref 3.87–5.11)
RDW: 12.9 % (ref 11.5–15.5)
WBC: 7.6 10*3/uL (ref 4.0–10.5)
nRBC: 0 % (ref 0.0–0.2)

## 2020-05-01 LAB — BASIC METABOLIC PANEL
Anion gap: 13 (ref 5–15)
BUN: 19 mg/dL (ref 6–20)
CO2: 23 mmol/L (ref 22–32)
Calcium: 9.1 mg/dL (ref 8.9–10.3)
Chloride: 105 mmol/L (ref 98–111)
Creatinine, Ser: 1.01 mg/dL — ABNORMAL HIGH (ref 0.44–1.00)
GFR calc Af Amer: >60 mL/min (ref >60)
GFR calc non Af Amer: >60 mL/min (ref >60)
Glucose, Bld: 104 mg/dL — ABNORMAL HIGH (ref 70–99)
Potassium: 3.7 mmol/L (ref 3.5–5.1)
Sodium: 141 mmol/L (ref 135–145)

## 2020-05-01 LAB — TROPONIN I (HIGH SENSITIVITY)
Troponin I (High Sensitivity): <2 ng/L (ref <18)
Troponin I (High Sensitivity): <2 ng/L (ref <18)

## 2020-05-01 MED ORDER — SODIUM CHLORIDE 0.9% FLUSH: 3.0000 mL | Freq: Once | INTRAVENOUS | Status: DC

## 2020-05-01 NOTE — ED Triage Notes (Signed)
The pt had some chest tightness  On Friday after a  Small walk  And since this am when she awakened she she felt lik something sitting on her chest  Some sob since Friday.  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

## 2020-05-02 NOTE — ED Notes (Signed)
Patient verbalizes understanding of discharge instructions. Opportunity for questioning and answers were provided. Armband removed by staff, pt discharged from ED. Ambulated out to lobby  

## 2020-05-02 NOTE — ED Provider Notes (Signed)
[facility] EMERGENCY DEPARTMENT Provider Note   CSN: [id number] Arrival date & time: 05/01/20  1826     History Chief Complaint  Patient presents with  . Chest Pain    Patty Ruiz is a 49 y.o. female.  The history is provided by the patient and medical records. No language interpreter was used.  Chest Pain  Patty Ruiz is a 49 y.o. female who presents to the Emergency Department complaining of chest pain. She presents the emergency department complaining of chest pain described as a tightness and burning that started when she woke yesterday. Pain is across her entire chest. It is constant but worse with activity. She denies any fevers, cough, shortness of breath, nausea, vomiting, Donald pain, leg swelling or pain. She did have a COVID 19 vaccine two weeks ago and states she did have body swelling in relation to that. Her swelling has resolved. She has no medical problems. No history of hypertension, hyperlipidemia or diabetes. She is a non-smoker. She has no personal history of DVT. She is not on hormones. She has a family history of coronary artery disease and her mother at the age of 57.    Past Medical History:  Diagnosis Date  . Dysmenorrhea   . Endometriosis   . Family history of breast cancer   . Gluten intolerance   . 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 Hyperlipidemia   . Hypothyroidism   . Menorrhagia   . PCOD (polycystic ovarian disease)   . PONV (postoperative nausea and vomiting)   . Sleep pattern disturbance    TRAUMATIC CLOSED HEAD INJURY RESIDUAL IN 1996--  TAKES TRAZADONE    Patient Active Problem List   Diagnosis Date Noted  . Pelvic and perineal pain 10/23/2018  . Hypothyroid 04/19/2017  . Foraminal stenosis of lumbosacral region 04/19/2017  . Insomnia 04/19/2017  . Anxiety 04/19/2017  . Ovarian enlargement, right 4.7 cm on CT scan 10/25/2015  .  Menorrhagia 03/15/2015  . Family history of breast cancer 12/21/2014  . Axillary mass, left 11/04/2012  . Incisional hernia, umbilicus 92/10/9416  . Abdominal pain, possible appendicitis 05/16/2012    Past Surgical History:  Procedure Laterality Date  . ANTERIOR CERVICAL DECOMP/DISCECTOMY FUSION  2000   C4 -- C5  . BILATERAL SALPINGECTOMY Bilateral 03/15/2015   Procedure: BILATERAL SALPINGECTOMY;  Surgeon: Marylynn Pearson, MD;  Location: [facility];  Service: Gynecology;  Laterality: Bilateral;  . BREAST EXCISIONAL BIOPSY Left 2017   benign  . BREAST EXCISIONAL BIOPSY Left 2008   benign  . CHOLECYSTECTOMY  2004  ([place], [place])  . DX LAPAROSOCPY FOR PELVIC PAIN/   REPAIR UMBILICAL HERNIA WITH MESH  06-13-2012  . EXICISION AXILLARY  MASS Left 11-15-2012  . LAPAROSCOPIC APPENDECTOMY N/A 10/26/2015   Procedure: APPENDECTOMY LAPAROSCOPIC;  Surgeon: Erroll Luna, MD;  Location: [facility];  Service: General;  Laterality: N/A;  . LAPAROSCOPIC ASSISTED VAGINAL HYSTERECTOMY N/A 03/15/2015   Procedure: LAPAROSCOPIC ASSISTED VAGINAL HYSTERECTOMY;  Surgeon: Marylynn Pearson, MD;  Location: [facility];  Service: Gynecology;  Laterality: N/A;  . LAPAROSCOPY  x2  last one 2011 [facility])   endometriosis  . LAPAROSCOPY N/A 10/26/2015   Procedure: LAPAROSCOPY DIAGNOSTIC;  Surgeon: Erroll Luna, MD;  Location: [facility];  Service: General;  Laterality: N/A;  . MANDIBLE FRACTURE SURGERY Bilateral 1998   2000-- [place]   . REMOVAL BENIGN MASTOID  TUMOR RIGHT EAR  2007     OB History   No obstetric history on file.     Family History  Problem Relation Age of Onset  . Cancer Father 37       melanoma - retinal  . Heart disease Maternal Aunt   . Heart disease Maternal Uncle   . Cancer Paternal Aunt 15       breast cancer- reportedly BRCA+ but report not available at present time  . Cancer Paternal Grandfather 35       prostate  . Heart attack Paternal  Grandfather   . Cancer Other        reportedly has 2nd cousin on maternal side that is also BRCA+ but no report available  . Heart disease Maternal Aunt   . Heart disease Maternal Aunt   . Stomach cancer Neg Hx   . Esophageal cancer Neg Hx     Social History   Tobacco Use  . Smoking status: Never Smoker  . Smokeless tobacco: Never Used  Substance Use Topics  . Alcohol use: No  . Drug use: No    Home Medications Prior to Admission medications   Medication Sig Start Date End Date Taking? Authorizing Provider  aspirin-acetaminophen-caffeine (EXCEDRIN MIGRAINE) [phone] MG tablet Take 1 tablet by mouth every 6 (six) hours as needed for headache.    [provider]  dicyclomine (BENTYL) 20 MG tablet Take 1 tablet (20 mg total) by mouth 4 (four) times daily -  before meals and at bedtime. 03/04/20   Thornton Park, MD  EPIPEN 2-PAK 0.3 MG/0.3ML SOAJ injection See admin instructions. 08/03/15   [provider]  Multiple Vitamins-Minerals (MULTIVITAMIN WITH MINERALS) tablet Take 1 tablet by mouth daily.    [provider]  pantoprazole (PROTONIX) 40 MG tablet Take 1 tablet (40 mg total) by mouth daily. 06/23/19   Thornton Park, MD  pregabalin (LYRICA) 25 MG capsule Take 25 mg by mouth daily.    [provider]  sulfamethoxazole-trimethoprim (BACTRIM DS) 800-160 MG tablet Take 1 tablet by mouth 2 (two) times daily. 04/16/20   Thornton Park, MD  thyroid (ARMOUR) 120 MG tablet Take 120 mg by mouth daily.    [provider]  traZODone (DESYREL) 50 MG tablet Take 50 mg by mouth at bedtime.    [provider]    Allergies    Ciprofloxacin, Codeine, Morphine and related, Shellfish allergy, Other, and Adhesive [tape]  Review of Systems   Review of Systems  Cardiovascular: Positive for chest pain.  All other systems reviewed and are negative.   Physical Exam Updated Vital Signs BP (!) 145/98   Pulse 83   Temp 98.1 F (36.7 C)  (Oral)   Resp 16   Ht '5\' 5"'$  (1.651 m)   Wt 88.8 kg   LMP 02/18/2015 (Exact Date)   SpO2 96%   BMI 32.58 kg/m   Physical Exam Vitals and nursing note reviewed.  Constitutional:      Appearance: She is well-developed.  HENT:     Head: Normocephalic and atraumatic.  Cardiovascular:     Rate and Rhythm: Normal rate and regular rhythm.     Heart sounds: No murmur.  Pulmonary:     Effort: Pulmonary effort is normal. No respiratory distress.     Breath sounds: Normal breath sounds.  Abdominal:     Palpations: Abdomen is soft.     Tenderness: There is no abdominal tenderness. There is no guarding or rebound.  Musculoskeletal:  General: No swelling or tenderness.  Skin:    General: Skin is warm and dry.  Neurological:     Mental Status: She is alert and oriented to person, place, and time.  Psychiatric:        Behavior: Behavior normal.     ED Results / Procedures / Treatments   Labs (all labs ordered are listed, but only abnormal results are displayed) Labs Reviewed  BASIC METABOLIC PANEL - Abnormal; Notable for the following components:      Result Value   Glucose, Bld 104 (*)    Creatinine, Ser 1.01 (*)    All other components within normal limits  CBC  I-STAT BETA HCG BLOOD, ED ([facility], [facility], [facility] ONLY)  TROPONIN I (HIGH SENSITIVITY)  TROPONIN I (HIGH SENSITIVITY)    EKG EKG Interpretation  Date/Time:  Saturday May 01 2020 18:35:35 EDT Ventricular Rate:  96 PR Interval:  190 QRS Duration: 82 QT Interval:  352 QTC Calculation: 444 R Axis:   -6 Text Interpretation: Normal sinus rhythm Possible Left atrial enlargement Low voltage QRS Borderline ECG no prior available for comparison Confirmed by Quintella Reichert [phone]) on 05/02/2020 8:06:49 AM   Radiology DG Chest 2 View  Result Date: 05/01/2020 CLINICAL DATA:  Chest discomfort and shortness of breath EXAM: CHEST - 2 VIEW COMPARISON:  None. FINDINGS: Lungs are clear. Heart size and pulmonary vascularity are  normal. No adenopathy. No pneumothorax. There is midthoracic dextroscoliosis. There is postoperative change in the lower cervical spine. There are surgical clips in the right upper quadrant of the abdomen. IMPRESSION: Lungs clear.  Cardiac silhouette normal.  No adenopathy evident. Electronically Signed   By: Lowella Grip III M.D.   On: 05/01/2020 19:24    Procedures Procedures (including critical care time)  Medications Ordered in ED Medications  sodium chloride flush (NS) 0.9 % injection 3 mL (has no administration in time range)    ED Course  I have reviewed the triage vital signs and the nursing notes.  Pertinent labs & imaging results that were available during my care of the patient were reviewed by me and considered in my medical decision making (see chart for details).    MDM Rules/Calculators/A&P                     Patient here for evaluation of chest pain/burning since yesterday. Pain is constant in nature. She is perc negative, doubt PE. Presentation is not consistent with ACS, dissection. No evidence of pneumonia or acute CHF. Discussed with patient unclear source of symptoms but feels safe for discharge. Discussed importance of PCP follow-up as well as return precautions.  Final Clinical Impression(s) / ED Diagnoses Final diagnoses:  Nonspecific chest pain    Rx / DC Orders ED Discharge Orders    None       Quintella Reichert, MD 05/02/20 [phone]

## 2020-05-28 ENCOUNTER — Ambulatory Visit: Payer: Commercial Managed Care - PPO | Admitting: Gastroenterology

## 2020-06-10 ENCOUNTER — Ambulatory Visit: Payer: Commercial Managed Care - PPO | Admitting: Gastroenterology

## 2020-07-15 DIAGNOSIS — N809 Endometriosis, unspecified: Secondary | ICD-10-CM | POA: Insufficient documentation

## 2020-07-16 ENCOUNTER — Other Ambulatory Visit: Payer: Self-pay | Admitting: Family Medicine

## 2020-07-16 DIAGNOSIS — R1031 Right lower quadrant pain: Secondary | ICD-10-CM

## 2020-07-16 DIAGNOSIS — R109 Unspecified abdominal pain: Secondary | ICD-10-CM

## 2020-07-16 DIAGNOSIS — Z87442 Personal history of urinary calculi: Secondary | ICD-10-CM

## 2020-07-19 ENCOUNTER — Ambulatory Visit
Admission: RE | Admit: 2020-07-19 | Discharge: 2020-07-19 | Disposition: A | Discharge status: Home / Self Care | Payer: Commercial Managed Care - PPO | Source: Ambulatory Visit | Attending: Family Medicine | Admitting: Family Medicine

## 2020-07-19 DIAGNOSIS — R1031 Right lower quadrant pain: Secondary | ICD-10-CM

## 2020-07-19 DIAGNOSIS — R109 Unspecified abdominal pain: Secondary | ICD-10-CM

## 2020-07-19 DIAGNOSIS — Z87442 Personal history of urinary calculi: Secondary | ICD-10-CM

## 2020-07-30 ENCOUNTER — Encounter: Payer: Self-pay | Admitting: Gastroenterology

## 2020-07-30 ENCOUNTER — Ambulatory Visit (INDEPENDENT_AMBULATORY_CARE_PROVIDER_SITE_OTHER): Payer: Commercial Managed Care - PPO | Admitting: Gastroenterology

## 2020-07-30 VITALS — BP 118/74 | HR 79 | Ht 65.0 in | Wt 194.0 lb

## 2020-07-30 DIAGNOSIS — R140 Abdominal distension (gaseous): Secondary | ICD-10-CM

## 2020-07-30 DIAGNOSIS — R1084 Generalized abdominal pain: Secondary | ICD-10-CM

## 2020-07-30 DIAGNOSIS — R194 Change in bowel habit: Secondary | ICD-10-CM | POA: Diagnosis not present

## 2020-07-30 MED ORDER — RIFAXIMIN 550 MG PO TABS
550.0000 mg | ORAL_TABLET | Freq: Three times a day (TID) | ORAL | 0 refills | Status: DC
Start: 2020-07-30 — End: 2020-09-22

## 2020-07-30 MED ORDER — RIFAXIMIN 550 MG PO TABS
550.0000 mg | ORAL_TABLET | Freq: Three times a day (TID) | ORAL | 0 refills | Status: DC
Start: 2020-07-30 — End: 2020-07-30

## 2020-07-30 NOTE — Patient Instructions (Signed)
If you are age 48 or older, your body mass index should be between 23-30. Your Body mass index is 32.28 kg/m. If this is out of the aforementioned range listed, please consider follow up with your Primary Care Provider.  If you are age 35 or younger, your body mass index should be between 19-25. Your Body mass index is 32.28 kg/m. If this is out of the aformentioned range listed, please consider follow up with your Primary Care Provider.   You have been given a testing kit to check for small intestine bacterial overgrowth (SIBO) which is completed by a company named [facility]. Make sure to return your test in the mail using the return mailing label given to you along with the kit. Your demographic and insurance information have already been sent to the company and they should be in contact with you over the next week regarding this test. [facility] will collect an upfront charge of $99.74 for commercial insurance plans and $209.74 is you are paying cash. Make sure to discuss with [facility] PRIOR to having the test if they have gotten informatoin from your insurance company as to how much your testing will cost out of pocket, if any. Please keep in mind that you will be getting a call from phone number [phone] or a similar number. If you do not hear from them within this time frame, please call our office at [phone].   We have sent your demographic information and a prescription for Xifaxan to [facility]. This pharmacy is able to get medication approved through insurance and get you the lowest copay possible. If you have not heard from them within 1 week, please call our office at [phone] to let us know.  Thank you for trusting me with your gastrointestinal care!    Thornton Park, MD, MPH

## 2020-07-30 NOTE — Progress Notes (Signed)
Referring Provider: Marylynn Pearson, MD Primary Care Physician:  Midge Minium, MD  Chief complaint: Abdominal pain, change in bowel habits   IMPRESSION:  Change in bowel habits    -GI pathogen panel negative, pancreatic elastase normal, CRP negative, ESR 25    - improved with empiric trial of SeptraDS for possible SIBO Lower abdominal pain with associated bloating, improved with dicyclomine    - occurs during the 3rd week of month    - transvaginal ultrasound 02/2019 showed no cause for symptoms     - No obvious source on CT abd/pelvis 06/02/19, EGD or colonoscopy 06/17/19    - improved with trial of dicyclomine, now with returning increase in frequency Duodenitis and reactive gastropathy on EGD 06/17/2019    - Biopsies negative for H. Pylori    - On pantoprazole Mild cecal erythema on colonoscopy 06/17/2019    -Mildly active chronic nonspecific colitis seen on cecal biopsies, no granulomas seen    -Random colon and terminal ileum biopsies were otherwise normal    -Normal fecal calprotectin at 34/15/21 Small hiatal hernia Family history of colon cancer (maternal grandfather in his 32) C Diff 2019 Patient follows a gluten-free diet due to suspected gluten sensitivity Prior cholecystectomy Colonoscopy >10 years ago in [place] Hyperplastic polyp on colonoscopy 06/17/19  Suspected SIBO given her partial response to empiric antibiotics. Will proceed with formal testing to documenting SIBO +/- methane.      PLAN: Lactulose breath test for SIBO Xifaxan 550 mg TID x 14 days Resume pantoprazole 40 mg QAM Consider trial of  Lotronex 0.5 mg twice daily for 4 week if symptoms worsen Follow-up office appointment in 4-6 weeks  HPI: Patty Ruiz is a 49 y.o. female referred by Dr. Julien Girt for abdominal pain. She returns in scheduled follow-up. 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  Interval history is obtained through the patient and  review of her electronic health record.  History of C. difficile 2019.  CT of the abdomen and pelvis with contrast 06/02/2019.  It shows no source for her abdominal pain.  There is nonobstructive left nephrolithiasis.  Colonoscopy 06/17/19 to evaluate abdominal pain revealed: - A 3 mm sessile hyperplastic polyp was found in the rectum.  - A diffuse area of very mildly erythematous mucosa was found in the cecum. Biopsies showed mildly active chronic nonspecific colitis. There were no grandulomas. - The distal ileum appeared normal endoscopically and on biopsies.  EGD 06/17/19 to evaluate abdominal pain revealed:  - Normal esophagus. Biopsies showed reflux esophagitis. - Gastropathy in the gastric body and in the gastric antrum. Biopsies showed no H pylori. - The examined duodenum was normal. Biopsies were normal. - Small hiatal hernia.   Labs in April 2021 showed a normal stool pathogen panel, normal fecal calprotectin to 34, normal pancreatic elastase, CRP less than 1, and sedimentation rate 25.  Abdominal cramping had largely improved with dicyclomine, but has recently started to occur again and in increasing frequency. In the past, the pain was primarily localized to the right lower quadrant and had associated abdominal cramping,  bloating and gas. Described as a "tearing in my stomach" and "kicked in the gut."   For the last 6+ months despite increased fiber/Metamucil, she finds that her stools are the consistency of tooth paste. Frequent bowel movements, up to hourly. Frequent postprandial defecation 30 minutes after eating. Sense of incomplete evacuation. Requiring wet wipes. Mucuous initially, but no blood or mucous in the  stool now. Initially thought the stools could be oily but no longer feels that way.  Symptoms started at the start of metformin. Other new symptoms improved, but her stools have not returned to normal despite discontinuation of metformin.  Continues on daily Plexus  probiotic. Uses some Excedrin for headaches.  Did not fill the Xifaxan as recommended at the time of her last office visit 03/04/20 because it was a $400 co-pay.  Empiric trial of  Septra DS worked initially with symptom relief for months. Symptoms recenty resumed.  Renal protocol CT 07/19/20: nonobstructing calculus, 2.5 cm cystic lesion Working on pelvic floor.  Apple cider vinegar  Having 5 BM daily daily. Sense of incomplete evacuation.   Some dysphagia when she swallows over the last several weeks.   Past Medical History:  Diagnosis Date  . Dysmenorrhea   . Endometriosis   . Family history of breast cancer   . Gluten intolerance   . 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 Hyperlipidemia   . Hypothyroidism   . Menorrhagia   . PCOD (polycystic ovarian disease)   . PONV (postoperative nausea and vomiting)   . 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 ANTERIOR CERVICAL DECOMP/DISCECTOMY FUSION  2000   C4 -- C5  . BILATERAL SALPINGECTOMY Bilateral 03/15/2015   Procedure: BILATERAL SALPINGECTOMY;  Surgeon: Marylynn Pearson, MD;  Location: [facility];  Service: Gynecology;  Laterality: Bilateral;  . BREAST EXCISIONAL BIOPSY Left 2017   benign  . BREAST EXCISIONAL BIOPSY Left 2008   benign  . CHOLECYSTECTOMY  2004  ([place], [place])  . DX LAPAROSOCPY FOR PELVIC PAIN/   REPAIR UMBILICAL HERNIA WITH MESH  06-13-2012  . EXICISION AXILLARY  MASS Left 11-15-2012  . LAPAROSCOPIC APPENDECTOMY N/A 10/26/2015   Procedure: APPENDECTOMY LAPAROSCOPIC;  Surgeon: Erroll Luna, MD;  Location: [facility];  Service: General;  Laterality: N/A;  . LAPAROSCOPIC ASSISTED VAGINAL HYSTERECTOMY N/A 03/15/2015   Procedure: LAPAROSCOPIC ASSISTED VAGINAL HYSTERECTOMY;  Surgeon: Marylynn Pearson, MD;  Location: [facility];   Service: Gynecology;  Laterality: N/A;  . LAPAROSCOPY  x2  last one 2011 [facility])   endometriosis  . LAPAROSCOPY N/A 10/26/2015   Procedure: LAPAROSCOPY DIAGNOSTIC;  Surgeon: Erroll Luna, MD;  Location: [facility];  Service: General;  Laterality: N/A;  . MANDIBLE FRACTURE SURGERY Bilateral 1998   2000-- [place]   . REMOVAL BENIGN MASTOID TUMOR RIGHT EAR  2007    Current Outpatient Medications  Medication Sig Dispense Refill  . aspirin-acetaminophen-caffeine (EXCEDRIN MIGRAINE) 250-250-65 MG tablet Take 1 tablet by mouth every 6 (six) hours as needed for headache.    . dicyclomine (BENTYL) 20 MG tablet Take 1 tablet (20 mg total) by mouth 4 (four) times daily -  before meals and at bedtime. 360 tablet 1  . EPIPEN 2-PAK 0.3 MG/0.3ML SOAJ injection See admin instructions.  0  . Multiple Vitamins-Minerals (MULTIVITAMIN WITH MINERALS) tablet Take 1 tablet by mouth daily.    . pantoprazole (PROTONIX) 40 MG tablet Take 1 tablet (40 mg total) by mouth daily. 90 tablet 3  . pregabalin (LYRICA) 25 MG capsule Take 25 mg by mouth daily.    Marland Kitchen sulfamethoxazole-trimethoprim (BACTRIM DS) 800-160 MG tablet Take 1 tablet by mouth 2 (two) times daily. 28 tablet 0  . thyroid (ARMOUR)  120 MG tablet Take 120 mg by mouth daily.    . traZODone (DESYREL) 50 MG tablet Take 50 mg by mouth at bedtime.     No current facility-administered medications for this visit.    Allergies as of 07/30/2020 - Review Complete 05/01/2020  Allergen Reaction Noted  . Ciprofloxacin Anaphylaxis, Shortness Of Breath, and Swelling 05/15/2012  . Codeine Shortness Of Breath and Swelling 05/15/2012  . Morphine and related Shortness Of Breath, Swelling, and Other (See Comments) 05/15/2012  . Shellfish allergy Anaphylaxis 03/10/2015  . Other  03/10/2015  . Adhesive [tape] Rash 06/14/2012    Family History  Problem Relation Age of Onset  . Cancer Father 75       melanoma - retinal  . Heart disease Maternal Aunt   .  Heart disease Maternal Uncle   . Cancer Paternal Aunt 73       breast cancer- reportedly BRCA+ but report not available at present time  . Cancer Paternal Grandfather 61       prostate  . Heart attack Paternal Grandfather   . Cancer Other        reportedly has 2nd cousin on maternal side that is also BRCA+ but no report available  . Heart disease Maternal Aunt   . Heart disease Maternal Aunt   . Stomach cancer Neg Hx   . Esophageal cancer Neg Hx     Social History   Socioeconomic History  . Marital status: Married    Spouse name: Not on file  . Number of children: 2  . Years of education: Not on file  . Highest education level: Not on file  Occupational History  . Occupation: homemaker  Tobacco Use  . Smoking status: Never Smoker  . Smokeless tobacco: Never Used  Vaping Use  . Vaping Use: Never used  Substance and Sexual Activity  . Alcohol use: No  . Drug use: No  . Sexual activity: Yes  Other Topics Concern  . Not on file  Social History Narrative  . Not on file   Social Determinants of Health   Financial Resource Strain:   . Difficulty of Paying Living Expenses: Not on file  Food Insecurity:   . Worried About Charity fundraiser in the Last Year: Not on file  . Ran Out of Food in the Last Year: Not on file  Transportation Needs:   . Lack of Transportation (Medical): Not on file  . Lack of Transportation (Non-Medical): Not on file  Physical Activity:   . Days of Exercise per Week: Not on file  . Minutes of Exercise per Session: Not on file  Stress:   . Feeling of Stress : Not on file  Social Connections:   . Frequency of Communication with Friends and Family: Not on file  . Frequency of Social Gatherings with Friends and Family: Not on file  . Attends Religious Services: Not on file  . Active Member of Clubs or Organizations: Not on file  . Attends Archivist Meetings: Not on file  . Marital Status: Not on file  Intimate Partner Violence:   .  Fear of Current or Ex-Partner: Not on file  . Emotionally Abused: Not on file  . Physically Abused: Not on file  . Sexually Abused: Not on file    Physical Exam: General: Awake, alert, and oriented, and well communicative. In no acute distress.  Pulm: No labored breathing, speaking in full sentences without conversational dyspnea  Abd: soft, NT, ND, normal  bowel sounds, no tympany Psych: Pleasant, cooperative, normal speech, normal affect and normal insight Neuro: Alert and appropriate      Misty Foutz L. Tarri Glenn, MD, MPH [facility] 07/30/2020, 11:18 AM

## 2020-09-22 ENCOUNTER — Encounter: Payer: Self-pay | Admitting: Gastroenterology

## 2020-09-22 ENCOUNTER — Telehealth: Payer: Self-pay

## 2020-09-22 ENCOUNTER — Ambulatory Visit (INDEPENDENT_AMBULATORY_CARE_PROVIDER_SITE_OTHER): Payer: Commercial Managed Care - PPO | Admitting: Gastroenterology

## 2020-09-22 VITALS — BP 116/82 | HR 81 | Ht 65.0 in | Wt 198.0 lb

## 2020-09-22 DIAGNOSIS — R194 Change in bowel habit: Secondary | ICD-10-CM

## 2020-09-22 DIAGNOSIS — R140 Abdominal distension (gaseous): Secondary | ICD-10-CM

## 2020-09-22 NOTE — Telephone Encounter (Signed)
FAXED [place]: [facility]  Document: SIBO w/ Glucose Breath Test order Other records requested: None  All above requested information has been faxed successfully to the Company listed above. Documents and fax confirmation have been placed in the faxed file for future reference.

## 2020-09-22 NOTE — Progress Notes (Signed)
Referring Provider: Marylynn Pearson, MD Primary Care Physician:  Midge Minium, MD  Chief complaint: Abdominal pain, change in bowel habits   IMPRESSION:  Change in bowel habits - stool like "soft serve" ice cream    - Nondiagnostic GI pathogen panel, pancreatic elastase, CRP    - Mildly elevated ESR at 25    - improved with empiric trial of SeptraDS for possible SIBO Lower abdominal pain with associated bloating, improved with dicyclomine    - occurs during the 3rd week of month    - transvaginal ultrasound 02/2019 showed no cause for symptoms     - No obvious source on CT abd/pelvis 06/02/19, EGD or colonoscopy 06/17/19    - improved with trial of dicyclomine, now with returning increase in frequency Duodenitis and reactive gastropathy on EGD 06/17/2019    - Biopsies negative for H. Pylori    - On pantoprazole Mild cecal path erythema on colonoscopy 06/17/2019    -Mildly active chronic nonspecific colitis, no granulomas seen    -Random colon and terminal ileum biopsies were otherwise normal    -Normal fecal calprotectin  Small hiatal hernia Family history of colon cancer (maternal grandfather in his 50) C Diff 2019 Patient follows a gluten-free diet due to suspected gluten sensitivity Prior cholecystectomy Colonoscopy >10 years ago in [place] Hyperplastic polyp on colonoscopy 06/17/19  Suspected SIBO given her partial response to empiric antibiotics. Must also consider food intolerance, sucrose or fructose intolerance. No association with mammalian meat.  Will proceed with formal testing to documenting SIBO +/- methane.   Low threshold to repeat colonoscopy given nonspecific chronic colitis changes seen in the cecum on colonoscopy 06/2019.    PLAN: Glcuose breath test for SIBO Resume pantoprazole 40 mg QAM Diet recommendations: Avoid dairy, sugar substitutes, continue to avoid gluten Start a food diary Avoid NSAIDs Consider trial of  Lotronex 0.5 mg twice daily for 4  week if symptoms worsen Follow-up office appointment in 4-6 weeks  HPI: Patty Ruiz is a 49 y.o. female referred by Dr. Julien Girt for abdominal pain. She returns in scheduled follow-up. 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  Interval history is obtained through the patient and review of her electronic health record.  History of C. difficile 2019.  Evaluation of abdominal pain has included:    - CT of the abdomen and pelvis with contrast 06/02/2019: no source    - Colonoscopy 06/17/19: small hyperplastic rectal polyp, mild active chronic nonspecific colitis cecal patch without granulomas. TI was normal endoscopically and on biopsies    - EGD 06/17/19: Reflux esophagitis. H pylori negative gastropathy. Small hiatal hernia. Normal duodenal biopsies.     - Labs in April 2021:  normal stool pathogen panel, normal fecal calprotectin at 34, normal pancreatic elastase, CRP <1, and sedimentation rate 25.    - Renal protocol CT 07/19/20: nonobstructing calculus, 2.5 cm cystic lesion  Abdominal cramping had largely improved with dicyclomine, but has recently started to occur again and in increasing frequency. In the past, the pain was primarily localized to the right lower quadrant and there was associated abdominal cramping, bloating and gas.   Continues to have altered stools. Despite increased fiber/Metamucil, she finds that her stools are the consistency of tooth paste. Frequent bowel movements (up to 5 BM daily), postprandial defecation 30 minutes after eating, and sense of incomplete evacuation. Symptoms started at the start of metformin. Other new symptoms improved, but her stools have not returned  to normal despite discontinuation of metformin.  She has tried Plexus probiotic.  Uses Excedrin for headaches.  Xifaxan was too expensive so she had an empiric trial of  Septra DS BID x 14 days worked initially with symptom relief for months.  But relief was not  maintained.   Apple cider vinegar without large relief   Completed the breath test. But, we never got the results. Because she didn't hear from [place] she took the Xifaxan 500 mg TID.  Things were improved for a few days, but symptoms recurred. Back to where they were initially.  Notes significant hair loss that developed 2 days into the Xifaxan. Increased her Lyrica to 75 mg to treat her nerve pain that started in April 2021. Started Biotin and [facility] for antiinflammatory treatments. Has fatigue and unintentional weight gain. No change in her gut with eating gluten since Covid. No other new complaints or concerns.   Past Medical History:  Diagnosis Date   Dysmenorrhea    Endometriosis    Family history of breast cancer    Gluten intolerance    History of traumatic brain injury    1996-- CLOSED--  NO RESIDUAL OTHER THAN INSOMNIA   Hyperlipidemia    Hypothyroidism    Menorrhagia    PCOD (polycystic ovarian disease)    PONV (postoperative nausea and vomiting)    Sleep pattern disturbance    TRAUMATIC CLOSED HEAD INJURY RESIDUAL IN 1996--  TAKES TRAZADONE    Past Surgical History:  Procedure Laterality Date   ANTERIOR CERVICAL DECOMP/DISCECTOMY FUSION  2000   C4 -- C5   BILATERAL SALPINGECTOMY Bilateral 03/15/2015   Procedure: BILATERAL SALPINGECTOMY;  Surgeon: Marylynn Pearson, MD;  Location: [facility];  Service: Gynecology;  Laterality: Bilateral;   BREAST EXCISIONAL BIOPSY Left 2017   benign   BREAST EXCISIONAL BIOPSY Left 2008   benign   CHOLECYSTECTOMY  2004  ([place], [place])   [place] LAPAROSOCPY FOR PELVIC PAIN/   REPAIR UMBILICAL HERNIA WITH MESH  06-13-2012   EXICISION AXILLARY  MASS Left 11-15-2012   LAPAROSCOPIC APPENDECTOMY N/A 10/26/2015   Procedure: APPENDECTOMY LAPAROSCOPIC;  Surgeon: Erroll Luna, MD;  Location: [facility];  Service: General;  Laterality: N/A;   LAPAROSCOPIC ASSISTED VAGINAL HYSTERECTOMY N/A 03/15/2015   Procedure:  LAPAROSCOPIC ASSISTED VAGINAL HYSTERECTOMY;  Surgeon: Marylynn Pearson, MD;  Location: [facility];  Service: Gynecology;  Laterality: N/A;   LAPAROSCOPY  x2  last one 2011 [place])   endometriosis   LAPAROSCOPY N/A 10/26/2015   Procedure: LAPAROSCOPY DIAGNOSTIC;  Surgeon: Erroll Luna, MD;  Location: [facility];  Service: General;  Laterality: N/A;   MANDIBLE FRACTURE SURGERY Bilateral 1998   2000-- REVISION RIGHT SIDE    REMOVAL BENIGN MASTOID TUMOR RIGHT EAR  2007    Current Outpatient Medications  Medication Sig Dispense Refill   aspirin-acetaminophen-caffeine (EXCEDRIN MIGRAINE) 250-250-65 MG tablet Take 1 tablet by mouth every 6 (six) hours as needed for headache.     dicyclomine (BENTYL) 20 MG tablet Take 1 tablet (20 mg total) by mouth 4 (four) times daily -  before meals and at bedtime. 360 tablet 1   EPIPEN 2-PAK 0.3 MG/0.3ML SOAJ injection See admin instructions.  0   Multiple Vitamins-Minerals (MULTIVITAMIN WITH MINERALS) tablet Take 1 tablet by mouth daily.     pantoprazole (PROTONIX) 40 MG tablet Take 1 tablet (40 mg total) by mouth daily. 90 tablet 3   pregabalin (LYRICA) 25 MG capsule Take 25 mg po qAM and  50 mg po qPM     thyroid (ARMOUR) 120 MG tablet Take 120 mg by mouth daily.     traZODone (DESYREL) 50 MG tablet Take 50 mg by mouth at bedtime.     No current facility-administered medications for this visit.    Allergies as of 09/22/2020 - Review Complete 07/30/2020  Allergen Reaction Noted   Ciprofloxacin Anaphylaxis, Shortness Of Breath, and Swelling 05/15/2012   Codeine Shortness Of Breath and Swelling 05/15/2012   Morphine and related Shortness Of Breath, Swelling, and Other (See Comments) 05/15/2012   Shellfish allergy Anaphylaxis 03/10/2015   Other  03/10/2015   Adhesive [tape] Rash 06/14/2012    Family History  Problem Relation Age of Onset   Cancer Father 30       melanoma - retinal   Heart disease Maternal Aunt     Heart disease Maternal Uncle    Cancer Paternal Aunt 68       breast cancer- reportedly BRCA+ but report not available at present time   Cancer Paternal Grandfather 46       prostate   Heart attack Paternal Grandfather    Cancer Other        reportedly has 2nd cousin on maternal side that is also BRCA+ but no report available   Heart disease Maternal Aunt    Heart disease Maternal Aunt    Stomach cancer Neg Hx    Esophageal cancer Neg Hx     Social History   Socioeconomic History   Marital status: Married    Spouse name: Not on file   Number of children: 2   Years of education: Not on file   Highest education level: Not on file  Occupational History   Occupation: homemaker  Tobacco Use   Smoking status: Never Smoker   Smokeless tobacco: Never Used  Scientific laboratory technician Use: Never used  Substance and Sexual Activity   Alcohol use: No   Drug use: No   Sexual activity: Yes  Other Topics Concern   Not on file  Social History Narrative   Not on file   Social Determinants of Health   Financial Resource Strain:    Difficulty of Paying Living Expenses: Not on file  Food Insecurity:    Worried About Charity fundraiser in the Last Year: Not on file   [employer] of Food in the Last Year: Not on file  Transportation Needs:    Lack of Transportation (Medical): Not on file   Lack of Transportation (Non-Medical): Not on file  Physical Activity:    Days of Exercise per Week: Not on file   Minutes of Exercise per Session: Not on file  Stress:    Feeling of Stress : Not on file  Social Connections:    Frequency of Communication with Friends and Family: Not on file   Frequency of Social Gatherings with Friends and Family: Not on file   Attends Religious Services: Not on file   Active Member of Clubs or Organizations: Not on file   Attends Archivist Meetings: Not on file   Marital Status: Not on file  Intimate Partner Violence:     Fear of Current or Ex-Partner: Not on file   Emotionally Abused: Not on file   Physically Abused: Not on file   Sexually Abused: Not on file    Physical Exam: General: Awake, alert, and oriented, and well communicative. In no acute distress.  Pulm: No labored breathing, speaking in full  sentences without conversational dyspnea  Abd: soft, NT, ND, normal bowel sounds, no tympany Psych: Pleasant, cooperative, normal speech, normal affect and normal insight Neuro: Alert and appropriate      Kimberly L. Tarri Glenn, MD, MPH [facility] 09/24/2020, 7:01 PM

## 2020-09-22 NOTE — Patient Instructions (Addendum)
If you are age 48 or older, your body mass index should be between 23-30. Your Body mass index is 32.95 kg/m. If this is out of the aforementioned range listed, please consider follow up with your Primary Care Provider.  If you are age 44 or younger, your body mass index should be between 19-25. Your Body mass index is 32.95 kg/m. If this is out of the aformentioned range listed, please consider follow up with your Primary Care Provider.   You have been given a testing kit to check for small intestine bacterial overgrowth (SIBO) which is completed by a company named [facility]. Make sure to return your test in the mail using the return mailing label given to you along with the kit. Your demographic and insurance information have already been sent to the company and they should be in contact with you over the next week regarding this test. [facility] will collect an upfront charge of $99.74 for commercial insurance plans and $209.74 is you are paying cash. Make sure to discuss with [facility] PRIOR to having the test if they have gotten informatoin from your insurance company as to how much your testing will cost out of pocket, if any. Please keep in mind that you will be getting a call from phone number [phone] or a similar number. If you do not hear from them within this time frame, please call our office at [phone].   Start a food journal  Resume pantoprazole    Thank you for trusting me with your gastrointestinal care!    Thornton Park, MD, MPH

## 2020-09-24 ENCOUNTER — Encounter: Payer: Self-pay | Admitting: Gastroenterology

## 2020-10-05 ENCOUNTER — Other Ambulatory Visit: Payer: Self-pay | Admitting: Endocrinology

## 2020-10-05 DIAGNOSIS — E039 Hypothyroidism, unspecified: Secondary | ICD-10-CM

## 2020-10-12 ENCOUNTER — Ambulatory Visit
Admission: RE | Admit: 2020-10-12 | Discharge: 2020-10-12 | Disposition: A | Discharge status: Home / Self Care | Payer: Commercial Managed Care - PPO | Source: Ambulatory Visit | Attending: Endocrinology | Admitting: Endocrinology

## 2020-10-12 DIAGNOSIS — E039 Hypothyroidism, unspecified: Secondary | ICD-10-CM

## 2020-10-14 ENCOUNTER — Other Ambulatory Visit: Payer: Self-pay | Admitting: Obstetrics and Gynecology

## 2020-10-14 DIAGNOSIS — R928 Other abnormal and inconclusive findings on diagnostic imaging of breast: Secondary | ICD-10-CM

## 2020-10-18 ENCOUNTER — Telehealth: Payer: Self-pay

## 2020-10-18 NOTE — Telephone Encounter (Signed)
Called [facility] to follow up on status of results and or receipt of kit from pat. States kit has not been received, no results to release. Called pt to follow up. LVM requesting returned call.

## 2020-10-19 NOTE — Telephone Encounter (Signed)
Returned pt call. States she had received a kit from [facility] in addition to the one we provided. States one kit is for Lactulose and another is for Glucose. Advised according to Dr. Tarri Glenn orders, she will be doing the Glucose test. Pt then proceeded to state that the reason she had not completed the test was because she was on ATB's and [facility] informed her NOT to be on ATB's. Clarified with pt about her being on ATB's because at the time she was provided the kit, she had not been on ATB's. States she recently developed a sinus infection and her PCP started her on ATB's. States she will clear up her infection first, then complete the test. Will follow up with [facility] at the beginning of December to see if kit had been submitted for processing.

## 2020-10-19 NOTE — Telephone Encounter (Signed)
Currently with pt and unable to provide her with my undivided attention. Will call as I am able.

## 2020-10-19 NOTE — Telephone Encounter (Signed)
Pt has sent several pt advice requests regarding her SIBO testing. I can see where you have been working on this and let the pt know I would have you call her back. Thanks.

## 2020-11-01 ENCOUNTER — Ambulatory Visit
Admission: RE | Admit: 2020-11-01 | Discharge: 2020-11-01 | Disposition: A | Discharge status: Home / Self Care | Payer: Commercial Managed Care - PPO | Source: Ambulatory Visit | Attending: Obstetrics and Gynecology | Admitting: Obstetrics and Gynecology

## 2020-11-01 ENCOUNTER — Other Ambulatory Visit: Payer: Self-pay

## 2020-11-01 DIAGNOSIS — R928 Other abnormal and inconclusive findings on diagnostic imaging of breast: Secondary | ICD-10-CM

## 2020-11-08 DIAGNOSIS — G8929 Other chronic pain: Secondary | ICD-10-CM | POA: Insufficient documentation

## 2020-11-10 ENCOUNTER — Telehealth: Payer: Self-pay

## 2020-11-10 NOTE — Telephone Encounter (Signed)
Called pt to f/u on submission of SIBO to [facility]. States she is planning to complete and submit test this week. Has had a few prior obligations/appts that interfered with her ability to fast before completion. States she will send me a My Chart message once test has been sent. Will continue efforts to f/u.

## 2020-12-02 NOTE — Telephone Encounter (Signed)
Jillyn Hidden from [facility] is calling regarding a negative result for the pt's SIBO test, the caller is wishing to confirm with the pt that she did drink the glucose and if any questions to please contact him.  CB [phone]

## 2020-12-08 NOTE — Telephone Encounter (Signed)
Called [facility] and spoke with [facility] re: status of these results. Matt confirmed test has been resulted and requested office fax #. States he will fax results now. Will continue to await results.

## 2020-12-09 NOTE — Telephone Encounter (Signed)
Results of this test have been received and have been placed on your desk for your review.

## 2020-12-13 ENCOUNTER — Other Ambulatory Visit: Payer: Self-pay

## 2020-12-13 DIAGNOSIS — K6389 Other specified diseases of intestine: Secondary | ICD-10-CM

## 2020-12-13 MED ORDER — RIFAXIMIN 550 MG PO TABS: 550.0000 mg | ORAL_TABLET | Freq: Three times a day (TID) | ORAL | 0 refills | Status: AC

## 2020-12-13 MED ORDER — NEOMYCIN SULFATE 500 MG PO TABS: 500.0000 mg | ORAL_TABLET | Freq: Two times a day (BID) | ORAL | 0 refills | Status: AC

## 2021-06-13 ENCOUNTER — Telehealth: Payer: Self-pay | Admitting: Gastroenterology

## 2021-06-13 NOTE — Telephone Encounter (Signed)
Good afternoon Dr. Tarri Glenn, patient was a a previous patient of yours. Recently went to other GI at [facility] but was unhappy with care and would like to transfer back to [place].  Records are in Epic.  Can you please review and advise on scheduling?  Thank you.

## 2021-06-16 NOTE — Telephone Encounter (Signed)
Records reviewed. Given that she has now seen two community gastroenterologists, she may be best served seeing someone at [facility], [place], or [facility] in consultation. I am willing to see her in follow-up, but, given her search for a second opinion after our visit, she may want to reach out to her PCP about a referral to [place], [place], or [place].

## 2021-06-17 NOTE — Telephone Encounter (Signed)
Spoke with patient and informed of Dr. Tarri Glenn recommendations.  Will call PCP for referral.

## 2021-08-04 HISTORY — PX: COLONOSCOPY: SHX174

## 2022-02-14 ENCOUNTER — Other Ambulatory Visit: Payer: Self-pay | Admitting: Obstetrics and Gynecology

## 2022-02-14 DIAGNOSIS — N644 Mastodynia: Secondary | ICD-10-CM

## 2022-03-06 ENCOUNTER — Ambulatory Visit
Admission: RE | Admit: 2022-03-06 | Discharge: 2022-03-06 | Disposition: A | Discharge status: Home / Self Care | Payer: Commercial Managed Care - PPO | Source: Ambulatory Visit | Attending: Obstetrics and Gynecology | Admitting: Obstetrics and Gynecology

## 2022-03-06 ENCOUNTER — Other Ambulatory Visit: Payer: Self-pay | Admitting: Obstetrics and Gynecology

## 2022-03-06 DIAGNOSIS — N6320 Unspecified lump in the left breast, unspecified quadrant: Secondary | ICD-10-CM

## 2022-03-06 DIAGNOSIS — N644 Mastodynia: Secondary | ICD-10-CM

## 2022-07-26 ENCOUNTER — Other Ambulatory Visit: Payer: Self-pay | Admitting: Endocrinology

## 2022-07-26 DIAGNOSIS — E785 Hyperlipidemia, unspecified: Secondary | ICD-10-CM

## 2022-07-27 DIAGNOSIS — Z9889 Other specified postprocedural states: Secondary | ICD-10-CM | POA: Insufficient documentation

## 2022-07-27 DIAGNOSIS — Z8669 Personal history of other diseases of the nervous system and sense organs: Secondary | ICD-10-CM | POA: Insufficient documentation

## 2022-10-10 ENCOUNTER — Ambulatory Visit
Admission: RE | Admit: 2022-10-10 | Discharge: 2022-10-10 | Disposition: A | Discharge status: Home / Self Care | Payer: No Typology Code available for payment source | Source: Ambulatory Visit | Attending: Endocrinology | Admitting: Endocrinology

## 2022-10-10 DIAGNOSIS — E785 Hyperlipidemia, unspecified: Secondary | ICD-10-CM

## 2022-10-16 ENCOUNTER — Other Ambulatory Visit: Payer: Commercial Managed Care - PPO

## 2022-11-08 ENCOUNTER — Other Ambulatory Visit: Payer: Commercial Managed Care - PPO

## 2023-08-01 ENCOUNTER — Other Ambulatory Visit: Payer: Self-pay | Admitting: Urology

## 2023-08-10 ENCOUNTER — Other Ambulatory Visit: Payer: Self-pay

## 2023-08-10 ENCOUNTER — Encounter (HOSPITAL_BASED_OUTPATIENT_CLINIC_OR_DEPARTMENT_OTHER): Payer: Self-pay | Admitting: Urology

## 2023-08-10 NOTE — Progress Notes (Signed)
Spoke w/ via phone for pre-op interview---pt Lab needs dos----  none             Lab results------none COVID test -----patient states asymptomatic no test needed Arrive at -------1200 NPO after MN NO Solid Food.  Clear liquids from MN until--- Med rec completed Medications to take morning of surgery -----Zofran PRN, Levothyroxine.  Diabetic medication ----- Patient instructed no nail polish to be worn day of surgery Patient instructed to bring photo id and insurance card day of surgery Patient aware to have Driver (ride ) / caregiver  husband Brayli Bezner  for 24 hours after surgery  Patient Special Instructions ----- Pre-Op special Instructions -----Hold her Zepbound 1 week before surgery, took on 08/08/2026 and will not take her dose next week.  Patient verbalized understanding of instructions that were given at this phone interview. Patient denies shortness of breath, chest pain, fever, cough at this phone interview.Spoke w/ via phone for pre-op interview.

## 2023-08-17 ENCOUNTER — Encounter (HOSPITAL_BASED_OUTPATIENT_CLINIC_OR_DEPARTMENT_OTHER): Payer: Self-pay | Admitting: Urology

## 2023-08-17 ENCOUNTER — Ambulatory Visit (HOSPITAL_BASED_OUTPATIENT_CLINIC_OR_DEPARTMENT_OTHER): Payer: Commercial Managed Care - PPO | Admitting: Anesthesiology

## 2023-08-17 ENCOUNTER — Other Ambulatory Visit: Payer: Self-pay

## 2023-08-17 ENCOUNTER — Encounter (HOSPITAL_BASED_OUTPATIENT_CLINIC_OR_DEPARTMENT_OTHER)
Admission: RE | Disposition: A | Discharge status: Home / Self Care | Payer: Self-pay | Source: Home / Self Care | Attending: Urology

## 2023-08-17 ENCOUNTER — Ambulatory Visit (HOSPITAL_BASED_OUTPATIENT_CLINIC_OR_DEPARTMENT_OTHER)
Admission: RE | Admit: 2023-08-17 | Discharge: 2023-08-17 | Disposition: A | Discharge status: Home / Self Care | Payer: Commercial Managed Care - PPO | Attending: Urology | Admitting: Urology

## 2023-08-17 DIAGNOSIS — N200 Calculus of kidney: Secondary | ICD-10-CM

## 2023-08-17 DIAGNOSIS — F419 Anxiety disorder, unspecified: Secondary | ICD-10-CM | POA: Diagnosis not present

## 2023-08-17 DIAGNOSIS — E039 Hypothyroidism, unspecified: Secondary | ICD-10-CM | POA: Diagnosis not present

## 2023-08-17 DIAGNOSIS — Z419 Encounter for procedure for purposes other than remedying health state, unspecified: Secondary | ICD-10-CM

## 2023-08-17 HISTORY — DX: Unspecified osteoarthritis, unspecified site: M19.90

## 2023-08-17 HISTORY — DX: Personal history of urinary calculi: Z87.442

## 2023-08-17 HISTORY — DX: Personal history of other diseases of the digestive system: Z87.19

## 2023-08-17 HISTORY — DX: Attention-deficit hyperactivity disorder, unspecified type: F90.9

## 2023-08-17 HISTORY — PX: CYSTOSCOPY/URETEROSCOPY/HOLMIUM LASER/STENT PLACEMENT: SHX6546

## 2023-08-17 HISTORY — DX: Headache, unspecified: R51.9

## 2023-08-17 SURGERY — CYSTOSCOPY/URETEROSCOPY/HOLMIUM LASER/STENT PLACEMENT
Anesthesia: General | Laterality: Left

## 2023-08-17 MED ORDER — ONDANSETRON HCL 4 MG/2ML IJ SOLN
INTRAMUSCULAR | Status: DC | PRN
  Administered 2023-08-17: 4 mg via INTRAVENOUS

## 2023-08-17 MED ORDER — PROPOFOL 10 MG/ML IV BOLUS
INTRAVENOUS | Status: DC | PRN
  Administered 2023-08-17: 200 mg via INTRAVENOUS
  Administered 2023-08-17: 20 mg via INTRAVENOUS

## 2023-08-17 MED ORDER — FENTANYL CITRATE (PF) 250 MCG/5ML IJ SOLN
INTRAMUSCULAR | Status: DC | PRN
  Administered 2023-08-17: 50 ug via INTRAVENOUS
  Administered 2023-08-17 (×2): 25 ug via INTRAVENOUS

## 2023-08-17 MED ORDER — OXYCODONE HCL 5 MG/5ML PO SOLN: 5.0000 mg | Freq: Once | ORAL | Status: AC | PRN

## 2023-08-17 MED ORDER — FENTANYL CITRATE (PF) 100 MCG/2ML IJ SOLN
INTRAMUSCULAR | Status: AC
  Filled 2023-08-17: qty 2

## 2023-08-17 MED ORDER — ACETAMINOPHEN 500 MG PO TABS
ORAL_TABLET | ORAL | Status: AC
  Filled 2023-08-17: qty 2

## 2023-08-17 MED ORDER — FENTANYL CITRATE (PF) 100 MCG/2ML IJ SOLN
25.0000 ug | INTRAMUSCULAR | Status: DC | PRN
  Administered 2023-08-17 (×2): 25 ug via INTRAVENOUS
  Administered 2023-08-17: 50 ug via INTRAVENOUS

## 2023-08-17 MED ORDER — DOCUSATE SODIUM 100 MG PO CAPS: 100.0000 mg | ORAL_CAPSULE | Freq: Every day | ORAL | 0 refills | Status: DC | PRN

## 2023-08-17 MED ORDER — PROPOFOL 500 MG/50ML IV EMUL
INTRAVENOUS | Status: DC | PRN
Start: 2023-08-17 — End: 2023-08-17
  Administered 2023-08-17: 150 ug/kg/min via INTRAVENOUS

## 2023-08-17 MED ORDER — SCOPOLAMINE 1 MG/3DAYS TD PT72
MEDICATED_PATCH | TRANSDERMAL | Status: AC
  Filled 2023-08-17: qty 1

## 2023-08-17 MED ORDER — PROPOFOL 1000 MG/100ML IV EMUL
INTRAVENOUS | Status: AC
  Filled 2023-08-17: qty 100

## 2023-08-17 MED ORDER — KETOROLAC TROMETHAMINE 30 MG/ML IJ SOLN
INTRAMUSCULAR | Status: DC | PRN
Start: 2023-08-17 — End: 2023-08-17
  Administered 2023-08-17: 30 mg via INTRAVENOUS

## 2023-08-17 MED ORDER — DEXAMETHASONE SODIUM PHOSPHATE 10 MG/ML IJ SOLN
INTRAMUSCULAR | Status: AC
  Filled 2023-08-17: qty 3

## 2023-08-17 MED ORDER — ACETAMINOPHEN 500 MG PO TABS
1000.0000 mg | ORAL_TABLET | Freq: Once | ORAL | Status: AC
  Administered 2023-08-17: 1000 mg via ORAL

## 2023-08-17 MED ORDER — ONDANSETRON HCL 4 MG PO TABS: 4.0000 mg | ORAL_TABLET | Freq: Three times a day (TID) | ORAL | 0 refills | Status: AC | PRN

## 2023-08-17 MED ORDER — OXYCODONE-ACETAMINOPHEN 5-325 MG PO TABS: 1.0000 | ORAL_TABLET | ORAL | 0 refills | Status: DC | PRN

## 2023-08-17 MED ORDER — KETOROLAC TROMETHAMINE 30 MG/ML IJ SOLN
INTRAMUSCULAR | Status: AC
  Filled 2023-08-17: qty 3

## 2023-08-17 MED ORDER — DEXAMETHASONE SODIUM PHOSPHATE 10 MG/ML IJ SOLN
INTRAMUSCULAR | Status: DC | PRN
  Administered 2023-08-17: 10 mg via INTRAVENOUS

## 2023-08-17 MED ORDER — LIDOCAINE HCL (PF) 2 % IJ SOLN
INTRAMUSCULAR | Status: AC
  Filled 2023-08-17: qty 15

## 2023-08-17 MED ORDER — OXYCODONE HCL 5 MG PO TABS
5.0000 mg | ORAL_TABLET | Freq: Once | ORAL | Status: AC | PRN
  Administered 2023-08-17: 5 mg via ORAL

## 2023-08-17 MED ORDER — IOHEXOL 300 MG/ML  SOLN
INTRAMUSCULAR | Status: DC | PRN
  Administered 2023-08-17: 10 mL via URETHRAL

## 2023-08-17 MED ORDER — ONDANSETRON HCL 4 MG/2ML IJ SOLN
INTRAMUSCULAR | Status: AC
  Filled 2023-08-17: qty 8

## 2023-08-17 MED ORDER — MIDAZOLAM HCL 2 MG/2ML IJ SOLN
INTRAMUSCULAR | Status: DC | PRN
  Administered 2023-08-17: 2 mg via INTRAVENOUS

## 2023-08-17 MED ORDER — DROPERIDOL 2.5 MG/ML IJ SOLN
0.6250 mg | Freq: Once | INTRAMUSCULAR | Status: AC | PRN
  Administered 2023-08-17: 0.625 mg via INTRAVENOUS

## 2023-08-17 MED ORDER — OXYCODONE HCL 5 MG PO TABS
ORAL_TABLET | ORAL | Status: AC
  Filled 2023-08-17: qty 1

## 2023-08-17 MED ORDER — SODIUM CHLORIDE 0.9 % IR SOLN
Status: DC | PRN
  Administered 2023-08-17: 7000 mL via INTRAVESICAL

## 2023-08-17 MED ORDER — CEPHALEXIN 500 MG PO CAPS: 500.0000 mg | ORAL_CAPSULE | Freq: Two times a day (BID) | ORAL | 0 refills | Status: AC

## 2023-08-17 MED ORDER — CEFAZOLIN SODIUM-DEXTROSE 2-4 GM/100ML-% IV SOLN
2.0000 g | INTRAVENOUS | Status: AC
  Administered 2023-08-17: 2 g via INTRAVENOUS

## 2023-08-17 MED ORDER — CEFAZOLIN SODIUM-DEXTROSE 2-4 GM/100ML-% IV SOLN
INTRAVENOUS | Status: AC
  Filled 2023-08-17: qty 100

## 2023-08-17 MED ORDER — DEXMEDETOMIDINE HCL IN NACL 80 MCG/20ML IV SOLN
INTRAVENOUS | Status: DC | PRN
Start: 2023-08-17 — End: 2023-08-17
  Administered 2023-08-17 (×2): 8 ug via INTRAVENOUS

## 2023-08-17 MED ORDER — LACTATED RINGERS IV SOLN: INTRAVENOUS | Status: DC

## 2023-08-17 MED ORDER — LIDOCAINE 2% (20 MG/ML) 5 ML SYRINGE
INTRAMUSCULAR | Status: DC | PRN
  Administered 2023-08-17: 100 mg via INTRAVENOUS

## 2023-08-17 MED ORDER — MIDAZOLAM HCL 2 MG/2ML IJ SOLN
INTRAMUSCULAR | Status: AC
  Filled 2023-08-17: qty 2

## 2023-08-17 MED ORDER — DROPERIDOL 2.5 MG/ML IJ SOLN
INTRAMUSCULAR | Status: AC
  Filled 2023-08-17: qty 2

## 2023-08-17 MED ORDER — SCOPOLAMINE 1 MG/3DAYS TD PT72
1.0000 | MEDICATED_PATCH | Freq: Once | TRANSDERMAL | Status: DC
  Administered 2023-08-17: 1.5 mg via TRANSDERMAL

## 2023-08-17 SURGICAL SUPPLY — 31 items
BAG DRAIN URO-CYSTO SKYTR STRL (DRAIN) ×1 IMPLANT
BAG DRN UROCATH (DRAIN) ×1
BASKET ZERO TIP NITINOL 2.4FR (BASKET) IMPLANT
BSKT STON RTRVL ZERO TP 2.4FR (BASKET) ×1
CATH SET URETHRAL DILATOR (CATHETERS) IMPLANT
CATH URETERAL DUAL LUMEN 10F (MISCELLANEOUS) IMPLANT
CATH URETL OPEN 5X70 (CATHETERS) ×1 IMPLANT
CLOTH BEACON ORANGE TIMEOUT ST (SAFETY) ×1 IMPLANT
GLOVE BIO SURGEON STRL SZ7 (GLOVE) ×1 IMPLANT
GOWN STRL REUS W/TWL LRG LVL3 (GOWN DISPOSABLE) ×1 IMPLANT
GUIDEWIRE STR DUAL SENSOR (WIRE) ×1 IMPLANT
GUIDEWIRE ZIPWRE .038 STRAIGHT (WIRE) IMPLANT
HOLDER FOLEY CATH W/STRAP (MISCELLANEOUS) IMPLANT
IV NS 1000ML (IV SOLUTION) ×1
IV NS 1000ML BAXH (IV SOLUTION) ×1 IMPLANT
IV NS IRRIG 3000ML ARTHROMATIC (IV SOLUTION) ×1 IMPLANT
KIT TURNOVER CYSTO (KITS) ×1 IMPLANT
LASER FIB FLEXIVA PULSE ID 365 (Laser) IMPLANT
MANIFOLD NEPTUNE II (INSTRUMENTS) ×1 IMPLANT
NS IRRIG 500ML POUR BTL (IV SOLUTION) ×1 IMPLANT
PACK CYSTO (CUSTOM PROCEDURE TRAY) ×1 IMPLANT
SHEATH NAVIGATOR HD 11/13X36 (SHEATH) IMPLANT
SLEEVE SCD COMPRESS KNEE MED (STOCKING) ×1 IMPLANT
STENT URET 6FRX24 CONTOUR (STENTS) IMPLANT
SYR 10ML LL (SYRINGE) ×1 IMPLANT
TRACTIP FLEXIVA PULS ID 200XHI (Laser) IMPLANT
TRACTIP FLEXIVA PULSE ID 200 (Laser) ×1
TRAY FOL W/BAG SLVR 16FR STRL (SET/KITS/TRAYS/PACK) IMPLANT
TRAY FOLEY W/BAG SLVR 16FR LF (SET/KITS/TRAYS/PACK) ×1
TUBE CONNECTING 12X1/4 (SUCTIONS) ×1 IMPLANT
TUBING UROLOGY SET (TUBING) ×1 IMPLANT

## 2023-08-17 NOTE — Transfer of Care (Signed)
Immediate Anesthesia Transfer of Care Note  Patient: Patty Ruiz  Procedure(s) Performed: CYSTOSCOPY/LEFT RETROGRADE PYELOGRAM/LEFT URETEROSCOPY/HOLMIUM LASER/LEFT STENT PLACEMENT (Left)  Patient Location: PACU  Anesthesia Type:General  Level of Consciousness: drowsy and patient cooperative  Airway & Oxygen Therapy: Patient Spontanous Breathing and Patient connected to face mask oxygen  Post-op Assessment: Report given to RN and Post -op Vital signs reviewed and stable  Post vital signs: Reviewed and stable  Last Vitals:  Vitals Value Taken Time  BP 111/89 08/17/23 1536  Temp    Pulse 68 08/17/23 1538  Resp 13 08/17/23 1538  SpO2 100 % 08/17/23 1538  Vitals shown include unfiled device data.  Last Pain:  Vitals:   08/17/23 1234  TempSrc: Oral  PainSc: 0-No pain      Patients Stated Pain Goal: 6 (08/17/23 1234)  Complications: No notable events documented.

## 2023-08-17 NOTE — Anesthesia Procedure Notes (Signed)
Procedure Name: LMA Insertion Date/Time: 08/17/2023 2:21 PM  Performed by: Dairl Ponder, CRNAPre-anesthesia Checklist: Patient identified, Emergency Drugs available, Suction available and Patient being monitored Patient Re-evaluated:Patient Re-evaluated prior to induction Oxygen Delivery Method: Circle System Utilized Preoxygenation: Pre-oxygenation with 100% oxygen Induction Type: IV induction Ventilation: Mask ventilation without difficulty LMA: LMA inserted LMA Size: 4.0 Number of attempts: 1 Airway Equipment and Method: Bite block Placement Confirmation: positive ETCO2 Tube secured with: Tape Dental Injury: Teeth and Oropharynx as per pre-operative assessment

## 2023-08-17 NOTE — Discharge Instructions (Addendum)
[facility] [phone] Post Ureteroscopy With or Without Stent Instructions  Definitions:  Ureter: The duct that transports urine from the kidney to the bladder. Stent:   A plastic hollow tube that is placed into the ureter, from the kidney to the bladder to prevent the ureter from swelling shut.  GENERAL INSTRUCTIONS:  Despite the fact that no skin incisions were used, the area around the ureter and bladder is raw and irritated. The stent is a foreign body which will further irritate the bladder wall. This irritation is manifested by increased frequency of urination, both day and night, and by an increase in the urge to urinate. In some, the urge to urinate is present almost always. Sometimes the urge is strong enough that you may not be able to stop yourself from urinating. The only real cure is to remove the stent and then give time for the bladder wall to heal which can't be done until the danger of the ureter swelling shut has passed, which varies.  You may see some blood in your urine while the stent is in place and a few days afterwards. Do not be alarmed, even if the urine was clear for a while. Get off your feet and drink lots of fluids until clearing occurs. If you start to pass clots or don't improve, call us.  DIET: You may return to your normal diet immediately. Because of the raw surface of your bladder, alcohol, spicy foods, acid type foods and drinks with caffeine may cause irritation or frequency and should be used in moderation. To keep your urine flowing freely and to avoid constipation, drink plenty of fluids during the day ( 8-10 glasses ). Tip: Avoid cranberry juice because it is very acidic.  ACTIVITY: Your physical activity doesn't need to be restricted. However, if you are very active, you may see some blood in your urine. We suggest that you reduce your activity under these circumstances until the bleeding has stopped.  BOWELS: It is important to  keep your bowels regular during the postoperative period. Straining with bowel movements can cause bleeding. A bowel movement every other day is reasonable. Use a mild laxative if needed, such as Milk of Magnesia 2-3 tablespoons, or 2 Dulcolax tablets. Call if you continue to have problems. If you have been taking narcotics for pain, before, during or after your surgery, you may be constipated. Take a laxative if necessary.   MEDICATION: You should resume your pre-surgery medications unless told not to. In addition you will often be given an antibiotic to prevent infection. These should be taken as prescribed until the bottles are finished unless you are having an unusual reaction to one of the drugs.  PROBLEMS YOU SHOULD REPORT TO [place]: Fevers over 100.5 Fahrenheit. Heavy bleeding, or clots ( See above notes about blood in urine ). Inability to urinate. Drug reactions ( hives, rash, nausea, vomiting, diarrhea ). Severe burning or pain with urination that is not improving.  FOLLOW-UP: You will need a follow-up appointment to monitor your progress. Call for this appointment at the number listed above. Usually the first appointment will be about three to fourteen days after your surgery.  You have a stent draining your kidney. This will remain in place for 6 weeks prior to removal.  You have a foley catheter draining your bladder and may remove this on Monday with provided syringe.  Post Anesthesia Home Care Instructions  Activity: Get plenty of rest for the remainder of the day. A responsible  adult should stay with you for 24 hours following the procedure.  For the next 24 hours, DO NOT: -Drive a car -Advertising copywriter -Drink alcoholic beverages -Take any medication unless instructed by your physician -Make any legal decisions or sign important papers.  Meals: Start with liquid foods such as gelatin or soup. Progress to regular foods as tolerated. Avoid greasy, spicy, heavy foods. If  nausea and/or vomiting occur, drink only clear liquids until the nausea and/or vomiting subsides. Call your physician if vomiting continues.  Special Instructions/Symptoms: Your throat may feel dry or sore from the anesthesia or the breathing tube placed in your throat during surgery. If this causes discomfort, gargle with warm salt water. The discomfort should disappear within 24 hours.

## 2023-08-17 NOTE — Anesthesia Preprocedure Evaluation (Addendum)
Anesthesia Evaluation  Patient identified by MRN, date of birth, ID band Patient awake    Reviewed: Allergy & Precautions, NPO status , Patient's Chart, lab work & pertinent test results  History of Anesthesia Complications (+) PONV and history of anesthetic complications  Airway Mallampati: II  TM Distance: >3 FB Neck ROM: Full    Dental no notable dental hx.    Pulmonary neg pulmonary ROS   Pulmonary exam normal        Cardiovascular negative cardio ROS Normal cardiovascular exam     Neuro/Psych  Headaches  Anxiety        GI/Hepatic Neg liver ROS, hiatal hernia,,,  Endo/Other  Hypothyroidism    Renal/GU LEFT RENAL URETEROPLEVIC JUNCTION CALCULUS     Musculoskeletal negative musculoskeletal ROS (+)    Abdominal   Peds  Hematology negative hematology ROS (+)   Anesthesia Other Findings Day of surgery medications reviewed with patient.  Reproductive/Obstetrics                             Anesthesia Physical Anesthesia Plan  ASA: 2  Anesthesia Plan: General   Post-op Pain Management: Tylenol PO (pre-op)*   Induction: Intravenous  PONV Risk Score and Plan: 4 or greater and Treatment may vary due to age or medical condition, Ondansetron, Dexamethasone, Midazolam, Scopolamine patch - Pre-op, Propofol infusion and TIVA  Airway Management Planned: LMA  Additional Equipment: None  Intra-op Plan:   Post-operative Plan: Extubation in OR  Informed Consent: I have reviewed the patients History and Physical, chart, labs and discussed the procedure including the risks, benefits and alternatives for the proposed anesthesia with the patient or authorized representative who has indicated his/her understanding and acceptance.     Dental advisory given  Plan Discussed with: CRNA  Anesthesia Plan Comments:        Anesthesia Quick Evaluation

## 2023-08-17 NOTE — Op Note (Signed)
Operative Note  Preoperative diagnosis:  1.  Left renal stone  Postoperative diagnosis: 1.  Left renal stone  Procedure(s): 1.  Cystoscopy 2. Left ureteroscopy with laser lithotripsy and basket extraction of stones 3. Left retrograde pyelogram 4. Left ureteral stent placement 5. Fluoroscopy with intraoperative interpretation 6. Foley catheter placement  Surgeon: Jettie Pagan, MD  Assistants:  None  Anesthesia:  General  Complications:  Grade III proximal left ureteral injury with mild extravasation of contrast  EBL:  Minimal  Specimens: 1. Stones for stone analysis (to be done at [facility])  Drains/Catheters: 1.  Left 6Fr x 24cm ureteral stent without tether string 2. 16 French foley catheter  Intraoperative findings:   Cystoscopy demonstrated no suspicious bladder lesions. Left ureteroscopy demonstrated widely patent ureter with no evidence of stones throughout the length of the ureter. Pyeloscopy revealed 9mm left lower pole stone that was repositioned in the left upper pole and treated with 50% dusting and 50% fragmenting with removal of all large stone fragments. Stone free at end of case. Unfortunately, although the ureteral access sheath passed very easily without resistance into the kidney, upon removal, there was apparent an approximately 2cm left proximal ureteral injury, grade III extending on the left aspect of the ureter with mild amount of extravasation. There was also mild splitting of the left mid ureter and distal ureter grade 1 with no evidence of extravasation in those locations. Successful left stent placement with proximal curl within the renal pelvis and distal curl within the bladder. Foley placement with clear yellow urine.  Indication:  Patty Ruiz is a 52 y.o. female with a history of a left renal stone that has been monitored for several years however given persistent low back pain, she sought treatment. Recent CT A/P with 9mm  left renal pelvis stone on 08/01/2023.  Description of procedure: After informed consent was obtained from the patient, the patient was identified and taken to the operating room and placed in the supine position.  General anesthesia was administered as well as perioperative IV antibiotics.  At the beginning of the case, a time-out was performed to properly identify the patient, the surgery to be performed, and the surgical site.  Sequential compression devices were applied to the lower extremities at the beginning of the case for DVT prophylaxis.  The patient was then placed in the dorsal lithotomy supine position, prepped and draped in sterile fashion.  Preliminary scout fluoroscopy revealed that there was a 9mm calcification area at the renal pelvis, which corresponds to the stone found on the preoperative CT scan. We then passed the 21-French rigid cystoscope through the urethra and into the bladder under vision without any difficulty, noting a normal urethra without strictures.  A systematic evaluation of the bladder revealed no evidence of any suspicious bladder lesions.  Ureteral orifices were in normal position.    Under cystoscopic and flouroscopic guidance, we cannulated the left ureteral orifice with a 5-French open-ended ureteral catheter and a gentle retrograde pyelogram was performed, revealing a normal caliber ureter without any filling defects. There was no hydronephrosis of the collecting system. A 0.038 sensor wire was then passed up to the level of the renal pelvis and secured to the drape as a safety wire. The ureteral catheter and cystoscope were removed, leaving the safety wire in place.   A semi-rigid ureteroscope was passed alongside the wire up the distal ureter which appeared normal. A second 0.038 sensor wire was passed under direct vision and the semirigid  scope was removed. The inner sheath of an 11/13 Fr ureteral access sheath was passed without resistance and without  difficulty, and subsequently a 11/13Fr ureteral access sheath was carefully advanced up the ureter to the level of the UPJ over this wire under fluoroscopic guidance without resistance and without difficulty. The flexible ureteroscope was advanced into the collecting system via the access sheath. The collecting system was inspected. The calculus was identified at the lower pole. Using a zero tip basket, the stone was repositioned into the left upper pole. Using the 200 micron holmium laser fiber, the stone was dusted 50% and fragmented the remaining 50%. A 2.2 Fr zero tip basket was used to remove the fragments under visual guidance. These were sent for chemical analysis. With the ureteroscope in the kidney, a gentle pyelogram was performed to delineate the calyceal system and we evaluated the calyces systematically. We encountered no large stone fragments. The rest of the stone fragments were very tiny and these were  irrigated away gently. The calyces were re-inspected and there were no significant stone fragment residual. She was stone free at the end of the case.  We then withdrew the ureteroscope back down the ureter along with the access sheath, noting no evidence of any stones along the course of the ureter. Unfortunately, it was apparent she had a grade III proximal left ureteral injury extending approximately 2cm in the left aspect of the ureter. The inferior, medial and superior sides of the proximal ureter were intact. She also had 2 small grade 1 injuries about 1-2cm in the mid ureter and distal ureter medially.  I advanced a dual lumen catheter over the safety wire and performed repeat retrograde pyelogram demonstrating mild contrast extravasation at the area of the proximal left ureter laterally.  We then used the Glidewire under fluoroscopic guidance and passed up a 6-French x 24 cm double-pigtail ureteral stent up the ureter, making sure that the proximal and distal ends coiled within the  kidney and bladder respectively. I did not leave a tether string. The cystoscope was then advanced back into the bladder under vision.  We were able to see the distal stent coiling nicely within the bladder. I placed a 16 French foley catheter with return of clear yellow urine.  The patient tolerated the procedure well. Patient was awoken from anesthesia and taken to the recovery room in stable condition. I was present and scrubbed for the entirety of the case.  Plan:  Patient will be discharged home.  Follow up with me in the office. Will plan to take back for retrograde pyelogram and ureteroscopy in 6 weeks allowing the stent to remain in place and the ureter to heal.    Matt R. Demetrio Leighty MD [facility]  Pager: [phone]

## 2023-08-17 NOTE — H&P (Signed)
Office Visit Report     08/01/2023   --------------------------------------------------------------------------------   Patty Ruiz  MRN: [id number]  DOB: 05-27-1971, 52 year old Female  SSN: [id number]   PRIMARY CARE:  C Duane Lope, MD  PRIMARY CARE FAX:  [phone]  REFERRING:  Jannifer Hick, MD  PROVIDER:  Jettie Pagan, M.D.  TREATING:  Bartholomew Crews, NP  LOCATION:  [facility] [phone]     --------------------------------------------------------------------------------   CC/HPI: Patty Ruiz is a 52 year old female seen in follow with history of nephrolithiasis.   #1. Nephrolithiasis:  She was first diagnosed with a renal stone in 10/2015. Metabolic work-up at that time revealed a 24-hour urine demonstrating hypercalciuria and hyperoxaluria. She states she has passed several stones. He has never received surgical treatment for her renal stones.  -CT 10/2015 with 6 mm left lower pole stone.  -CT A/P 07/2020 with stable 7 mm left lower pole stone.  -CT A/P 05/2021 with stable nonobstructive 7 mm left lower pole stone.  -KUB 12/08/2021 with stable 7mm left lower pole stone  -She did have an episode of flank pain and was seen by 1 my partners in 05/2021 however this pain resolved after she was found to have a nonobstructive stone. She has had no recurrent abdominal pain or flank pain since that time. She denies dysuria. She denies fevers or chills.  -After considering options, she elects for continued surveillance.   Patient currently denies fever, chills, sweats, nausea, vomiting, abdominal or flank pain, gross hematuria or dysuria.   08/01/2023: Patty Ruiz is a 52 year old female who presents today for follow-up of nephrolithiasis. She is currently experiencing left-sided lower back pain and discomfort associated with urinary frequency, urgency and dysuria. Her urinalysis does have red blood cells. She is also complaining of a headache today. She states she has not feeling  well. She is not having any fevers, chills or seeing blood in her urine.     ALLERGIES: Cipro TABS Codeine Derivatives Shellfish    MEDICATIONS: Dicyclomine Hcl PRN  Famotidine PRN  Ibuprofen  Linzess  Meloxicam  Multivitamin  Np Thyroid 90 mg tablet  Trazodone Hcl 50 mg tablet Oral  Zepbound     GU PSH: Hysterectomy Unilat SO - 2017       PSH Notes: Appendectomy, Cervical Vertebral Fusion, Jaw Surgery Repair, Hysterectomy, Hernia Repair, Cholesteatoma Surgery, Lumbar Fusion   NON-GU PSH: Appendectomy - 2017 Hernia Repair - 2017 Neck Spine Fusion - 2017     GU PMH: Renal calculus, Left - 2023, Left, - 2022, Left, - 2022, Left, - 2021, Left, she has what appears to be a single stone in the left kidney. She has not developed any new left or right renal calculi. We discussed more aggressive therapy for her stones today and I have recommended beginning indapamide. We discussed the mechanism of action of the medication and the need to limit her sodium intake. In addition she was given a prescription for tramadol to have on hand when she does pass the stone. Finally I will plan to see her back again in 6 months for a repeat KUB and sooner if needed., - 2017, Renal calculus, left, - 2017 Microscopic hematuria, Asymptomatic microscopic hematuria - 2017 Stress Incontinence, Female stress incontinence - 2017      PMH Notes: LUTS: She reported having sensations of bladder spasms. She indicated that these have been present for some time. She has been treated empirically with antibiotics without improvement.  Urine cultures:  12/15 -  negative (contamination)  4/16 - negative  12/16 - negative (contamination)   In 4/16 she underwent elective hysterectomy. She had a Foley catheter in and when the nurse came to remove her Foley catheter she told the patient that the balloon appeared to have been blown up in the urethra. The patient did indicate that she had some pain in that region but she  said it was hard to tell where the pain was coming from exactly since she had just undergone a hysterectomy. She continued to have some pain in the area of the urethra she thought for about 2 months and this seemed to be associated with some dysuria. She continues to have some discomfort that she describes as an achiness or cramping sensation at the termination of her urination currently. She also has some occasional episodes of urgency but does not recall having this before her hysterectomy.    SUI: She has noted that sneezing, exercise and other activities that increase her intra-abdominal pressure caused a small amount of leakage of urine that is not enough that she has to wear a pad.  Cystoscopy 2/17 - no abnormality.    Microscopic hematuria: She was noted to have dipstick positive hematuria by Dr. Vickey Sages and sent for further evaluation. Her urinalysis microscopically had 3-10 rbc/hpf. A noncontrasted CT scan in 11/16 revealed a single left lower pole renal calculus measuring 6 mm with no right renal calculi or ureteral stones noted. A urine culture was performed and found to be negative.  Cystoscopy 2/17 - normal.    Calculus disease: CT scan 4/17 - She was noted to have a 6 mm stone in the lower pole of her left kidney with Hounsfield units of ~900.  Metabolic workup: Serum studies revealed no abnormality with a normal serum creatinine and PTH. Her 24-hour urine showed hypercalciuria and hyperoxaluria with a urine volume of 2.11 L/24 hours.  Treatment: Low oxalate diet, increase fluid intake and avoidance of excessive calcium intake.     NON-GU PMH: Hypercalciuria - 2023, - 2021, Hypercalciuria, - 2017 Primary hyperoxaluria, Hyperoxaluria - 2017 Encounter for general adult medical examination without abnormal findings, Encounter for preventive health examination - 2017 Unspecified dyspareunia, Dyspareunia, female - 2017 Personal history of other endocrine, nutritional and metabolic  disease, History of hypothyroidism - 2017, History of hypercholesterolemia, - 2017    FAMILY HISTORY: Hypothyroidism - Runs In Family Melanoma of eye - Runs In Family   SOCIAL HISTORY: Marital Status: Married Preferred Language: English; Ethnicity: Not Hispanic Or Latino; Race: White Current Smoking Status: Patient has never smoked.  Social Drinker.  Drinks 1 caffeinated drink per day.     Notes: Alcohol use, Married, Never a smoker, Caffeine use   REVIEW OF SYSTEMS:    GU Review Female:   Patient reports frequent urination, hard to postpone urination, and burning /pain with urination. Patient denies get up at night to urinate, leakage of urine, stream starts and stops, trouble starting your stream, have to strain to urinate, and being pregnant.  Gastrointestinal (Upper):   Patient denies nausea, vomiting, and indigestion/ heartburn.  Gastrointestinal (Lower):   Patient denies diarrhea and constipation.  Constitutional:   Patient reports fatigue. Patient denies fever, night sweats, and weight loss.  Musculoskeletal:   Patient reports back pain. Patient denies joint pain.  Neurological:   Patient denies headaches and dizziness.  Psychologic:   Patient denies depression and anxiety.   VITAL SIGNS:      08/01/2023 10:24 AM  Weight 184 lb /  83.46 kg  Height 64 in / 162.56 cm  BP 114/77 mmHg  Pulse 79 /min  BMI 31.6 kg/m   MULTI-SYSTEM PHYSICAL EXAMINATION:    Constitutional: Well-nourished. No physical deformities. Normally developed. Good grooming.  Cardiovascular: Normal temperature, normal extremity pulses, no swelling, no varicosities.  Skin: No paleness, no jaundice, no cyanosis. No lesion, no ulcer, no rash.  Neurologic / Psychiatric: Oriented to time, oriented to place, oriented to person. No depression, no anxiety, no agitation.  Gastrointestinal: No mass, no tenderness, no rigidity, non obese abdomen.     Complexity of Data:  Source Of History:  Patient  Records Review:    Previous Doctor Records, Previous Patient Records  Urine Test Review:   Urinalysis  X-Ray Review: KUB: Reviewed Films. Reviewed Report. Discussed With Patient.  C.T. Stone Protocol: Reviewed Films. Reviewed Report. Discussed With Patient.     08/01/23  Urinalysis  Urine Appearance Clear   Urine Color Yellow   Urine Glucose Neg mg/dL  Urine Bilirubin Neg mg/dL  Urine Ketones Neg mg/dL  Urine Specific Gravity 1.025   Urine Blood 1+ ery/uL  Urine pH 5.5   Urine Protein Neg mg/dL  Urine Urobilinogen 0.2 mg/dL  Urine Nitrites Neg   Urine Leukocyte Esterase Neg leu/uL  Urine WBC/hpf 0 - 5/hpf   Urine RBC/hpf 3 - 10/hpf   Urine Epithelial Cells 10 - 20/hpf   Urine Bacteria Many (>50/hpf)   Urine Mucous Present   Urine Yeast NS (Not Seen)   Urine Trichomonas Not Present   Urine Cystals NS (Not Seen)   Urine Casts NS (Not Seen)   Urine Sperm Not Present    PROCEDURES:         C.T. Urogram - [id number]      Patient confirmed No Neulasta OnPro Device.          KUB - [id number]  A single view of the abdomen is obtained.  Calculi:  Left calculi size: ___69mm___      Patient confirmed No Neulasta OnPro Device.           Urinalysis w/Scope Dipstick Dipstick Cont'd Micro  Color: Yellow Bilirubin: Neg mg/dL WBC/hpf: 0 - 5/hpf  Appearance: Clear Ketones: Neg mg/dL RBC/hpf: 3 - 60/AVW  Specific Gravity: 1.025 Blood: 1+ ery/uL Bacteria: Many (>50/hpf)  pH: 5.5 Protein: Neg mg/dL Cystals: NS (Not Seen)  Glucose: Neg mg/dL Urobilinogen: 0.2 mg/dL Casts: NS (Not Seen)    Nitrites: Neg Trichomonas: Not Present    Leukocyte Esterase: Neg leu/uL Mucous: Present      Epithelial Cells: 10 - 20/hpf      Yeast: NS (Not Seen)      Sperm: Not Present         Ketoralac 60mg  - [id number], [id number] The area was prepped and cleaned using sterile technique. A band aid was applied. The pt tolerated well.  60 mg was given and zero was wasted...   Qty: 60 Adm. By: Andree Moro  Unit: mg Lot No [id number]   Route: IM Exp. Date 09/03/2024  Freq: None Mfgr.:   Site: Left Hip   ASSESSMENT:      ICD-10 Details  1 GU:   Renal calculus - N20.0 Left, Chronic, Worsening   PLAN:            Medications New Meds: Bactrim Ds 800 mg-160 mg tablet 1 tablet PO BID   #14  0 Refill(s)  Ketorolac Tromethamine 10 mg tablet 1 tablet PO QID do not take with meloxicam, Ibuprofen or  Naproxen. take with food.  #20  0 Refill(s)  Ondansetron Hcl 8 mg tablet 1 tablet PO Q 6 H PRN for nausea  #20  0 Refill(s)  Oxycodone-Acetaminophen 5 mg-325 mg tablet 1 tablet PO Q 6 H PRN kidney stone pain  #20  0 Refill(s)  Pharmacy Name:  [facility] [phone]  Address:  [address]   [place], [place] [address]  Phone:  [phone]  Fax:  [phone]    Stop Meds: Percocet 5 mg-325 mg tablet 1 tablet PO Q 6 H PRN  Start: 05/25/2021  Discontinue: 08/01/2023  - Reason: not taking Tramadol Hcl 50 mg tablet 1 tablet PO Q 4-6 H PRN  Start: 07/27/2016  Discontinue: 08/01/2023  - Reason: stopped           Orders Labs CULTURE, URINE  X-Rays: C.T. Stone Protocol Without I.V. Contrast - flank pain, blood in urine, KUB inconclusive.   X-Ray Notes: History:  Hematuria: Yes/No  Patient to see MD after exam: Yes/No  Previous exam: CT / IVP/ US/ KUB/ None  When:  Where:  Diabetic: Yes/ No  BUN/ Creatinine:  Date of last BUN Creatinine:  Weight in pounds:  Allergy- IV Contrast: Yes/ No  Conflicting diabetic meds: Yes/ No  Diabetic Meds:  Prior Authorization #: UMR NPCR :Ref [id number]            Schedule Procedure: 08/01/2023 at [facility] - [phone] - Ketoralac 60mg  (Toradol per 15mg  (60 mg)) - [id number], [id number]          Document Letter(s):  Created for Patient: Clinical Summary         Notes:   KUB shows a 8 mm opacity within the left renal shadow. Because her symptoms are so severe today and she is experiencing significant flank discomfort, I did advise CT scan. This  does show a 9 mm renal pelvic stone but may be causing some intermittent discomfort due to its position. Urinalysis will be sent for precautionary culture today. Stone intervention was discussed in detail today. For ureteroscopy, the patient understands that there is a chance for a staged procedure. Patient also understands that there is risk for bleeding, infection, injury to surrounding organs, and general risks of anesthesia. The patient also understands the placement of a stent and the risks of stent placement including, risk for infection, the risk for pain, and the risk for injury. For ESWL, the patient understands that there is a chance of failure of procedure, there is also a risk for bruising, infection, bleeding, and injury to surrounding structures. The patient verbalized understanding to these risks.   She would like to pursue ureteroscopy. A green sheet was placed today. I also sent in pain and nausea medication for her. She understands if she develops worsening pain, fevers, chills, inability to hold fluids down she should return to clinic.         Next Appointment:      Next Appointment: 08/17/2023 02:00 PM    Appointment Type: Surgery     Location: [facility] [phone]    Provider: Jettie Pagan, M.D.    Reason for Visit: NE/OP CYSTO, (L) RPG, HLL, (L) URS, (L) STENT    Urology Preoperative H&P   Chief Complaint: Left renal pelvis stone  History of Present Illness: Patty Ruiz is a 52 y.o. female with a left renal pelvis stone here for cystoscopy, left retrograde pyelogram, left ureteroscopy with laser lithotripsy and basket extraction of stone.  Denies fevers, chills, dysuria. Ucx 08/01/2023 NG.   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 08/10/2023   Dysmenorrhea    Endometriosis    Family history of breast cancer     Gluten intolerance    Headache    gets migraines occas. 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 08/10/2023   BILATERAL SALPINGECTOMY Bilateral 03/15/2015   Procedure: BILATERAL SALPINGECTOMY;  Surgeon: Zelphia Cairo, MD;  [facility];  Service: Gynecology;  Laterality: Bilateral;   BREAST EXCISIONAL BIOPSY Left 2017   benign   BREAST EXCISIONAL BIOPSY Left 2008   benign   CHOLECYSTECTOMY  2004  [facility] [place])   [place] LAPAROSOCPY FOR PELVIC PAIN/   REPAIR UMBILICAL HERNIA WITH MESH  06/13/2012   EXICISION AXILLARY  MASS Left 11/15/2012   LAPAROSCOPIC APPENDECTOMY N/A 10/26/2015   Procedure: APPENDECTOMY LAPAROSCOPIC;  Surgeon: Harriette Bouillon, MD;  Location: [facility] OR;  Service: General;  Laterality: N/A;   LAPAROSCOPIC ASSISTED VAGINAL HYSTERECTOMY N/A 03/15/2015   Procedure: LAPAROSCOPIC ASSISTED VAGINAL HYSTERECTOMY;  Surgeon: Zelphia Cairo, MD;  Location: [facility];  Service: Gynecology;  Laterality: N/A;   LAPAROSCOPY  x2  last one 2011 Claris Gower)   endometriosis   LAPAROSCOPY N/A 10/26/2015   Procedure: LAPAROSCOPY DIAGNOSTIC;  Surgeon: Harriette Bouillon, MD;  Location: [facility] OR;  Service: General;  Laterality: N/A;   MANDIBLE FRACTURE SURGERY Bilateral 1998   2000-- REVISION RIGHT SIDE    REMOVAL BENIGN MASTOID TUMOR RIGHT EAR  2007    Allergies:  Allergies  Allergen Reactions   Ciprofloxacin Anaphylaxis, Shortness Of Breath and Swelling    Codeine Shortness Of Breath and Swelling   Morphine And Codeine Shortness Of Breath, Swelling and Other (See Comments)    Severe abdominal cramps, causes what appears to be pancreatitis.   Shellfish Allergy Anaphylaxis    Shrimp, lobster, crab   Other     GLUTEN INTOLERENCE   Adhesive [Tape] Rash    Looks like poison oak where applied    Family History  Problem Relation Age of Onset   Cancer Father 34       melanoma - retinal   Heart disease Maternal Aunt    Heart disease Maternal Uncle    Cancer Paternal Aunt 37       breast cancer- reportedly BRCA+ but report not available at present time   Breast cancer Paternal Aunt    Cancer Paternal Grandfather 53       prostate   Heart attack Paternal Grandfather    Cancer Other        reportedly has 2nd cousin on maternal side that is also BRCA+ but no report available   Heart disease Maternal Aunt    Heart disease Maternal  Aunt    Cancer Paternal Aunt    Breast cancer Paternal Aunt    Stomach cancer Neg Hx    Esophageal cancer Neg Hx     Social History:  reports that she has never smoked. She has never used smokeless tobacco. She reports that she does not drink alcohol and does not use drugs.  ROS: A complete review of systems was performed.  All systems are negative except for pertinent findings as noted.  Physical Exam:  Vital signs in last 24 hours:   Constitutional:  Alert and oriented, No acute distress Cardiovascular: Regular rate and rhythm Respiratory: Normal respiratory effort, Lungs clear bilaterally GI: Abdomen is soft, nontender, nondistended, no abdominal masses GU: No CVA tenderness Lymphatic: No lymphadenopathy Neurologic: Grossly intact, no focal deficits Psychiatric: Normal mood and affect  Laboratory Data:  No results for input(s): "WBC", "HGB", "HCT", "PLT" in the last 72 hours.  No results for input(s): "NA", "K", "CL", "GLUCOSE", "BUN", "CALCIUM", "CREATININE" in the last 72 hours.  Invalid  input(s): "CO3"   No results found for this or any previous visit (from the past 24 hour(s)). No results found for this or any previous visit (from the past 240 hour(s)).  Renal Function: No results for input(s): "CREATININE" in the last 168 hours. CrCl cannot be calculated (Patient's most recent lab result is older than the maximum 21 days allowed.).  Radiologic Imaging: No results found.  I independently reviewed the above imaging studies.  Assessment and Plan Patty Ruiz is a 52 y.o. female with a left renal palvis stone here for cystoscopy, left retrograde pyelogram, left ureteroscopy with laser lithotripsy and basket extraction of stone. Denies fevers, chills, dysuria. Ucx 08/01/2023 NG.   -The risks, benefits and alternatives of cystoscopy with left URS/LL, JJ stent placement was discussed with the patient.  Risks include, but are not limited to: bleeding, urinary tract infection, ureteral injury, ureteral stricture disease, chronic pain, urinary symptoms, bladder injury, stent migration, the need for nephrostomy tube placement, MI, CVA, DVT, PE and the inherent risks with general anesthesia.  The patient voices understanding and wishes to proceed.   Matt R. Cyleigh Massaro MD 08/17/2023, 9:50 AM  [facility] Pager: [phone]

## 2023-08-18 NOTE — Anesthesia Postprocedure Evaluation (Signed)
Anesthesia Post Note  Patient: Caralynn [place] Groleau  Procedure(s) Performed: CYSTOSCOPY/LEFT RETROGRADE PYELOGRAM/LEFT URETEROSCOPY/HOLMIUM LASER/LEFT STENT PLACEMENT (Left)     Patient location during evaluation: PACU Anesthesia Type: General Level of consciousness: awake and alert Pain management: pain level controlled Vital Signs Assessment: post-procedure vital signs reviewed and stable Respiratory status: spontaneous breathing, nonlabored ventilation, respiratory function stable and patient connected to nasal cannula oxygen Cardiovascular status: blood pressure returned to baseline and stable Postop Assessment: no apparent nausea or vomiting Anesthetic complications: no   No notable events documented.  Last Vitals:  Vitals:   08/17/23 1709 08/17/23 1800  BP:  (!) 144/89  Pulse: 70 79  Resp: 16 19  Temp: 36.9 C   SpO2: 98% 97%    Last Pain:  Vitals:   08/17/23 1234  TempSrc: Oral  PainSc: 0-No pain                 Assumption Nation

## 2023-08-20 ENCOUNTER — Encounter (HOSPITAL_BASED_OUTPATIENT_CLINIC_OR_DEPARTMENT_OTHER): Payer: Self-pay | Admitting: Urology

## 2023-08-22 ENCOUNTER — Other Ambulatory Visit: Payer: Self-pay | Admitting: Urology

## 2023-09-13 ENCOUNTER — Encounter (HOSPITAL_BASED_OUTPATIENT_CLINIC_OR_DEPARTMENT_OTHER): Payer: Self-pay | Admitting: Urology

## 2023-09-17 ENCOUNTER — Encounter (HOSPITAL_BASED_OUTPATIENT_CLINIC_OR_DEPARTMENT_OTHER): Payer: Self-pay | Admitting: Urology

## 2023-09-17 NOTE — Progress Notes (Addendum)
Spoke w/ via phone for pre-op interview--- pt Lab needs dos----   no      Lab results------ no COVID test -----patient states asymptomatic no test needed Arrive at ------- 1000 on 09-24-2023 NPO after MN NO Solid Food.  Clear liquids from MN until--- 0900 Med rec completed Medications to take morning of surgery ----- synthroid, linzess, if needed may take imitrex, zofran Diabetic medication -----  pt stated last dose Zepbound 09-10-2023,  aware not to do today's dose prior to surgery on 09-24-2023.  (Pt was given instructions from [facility] PAT prior surgery 08-17-2023)  pt does injection on Monday's  Patient instructed no nail polish to be worn day of surgery Patient instructed to bring photo id and insurance card day of surgery Patient aware to have Driver (ride ) / caregiver    for 24 hours after surgery - husband, ryan Patient Special Instructions ----- n/a Pre-Op special Instructions -----  pt stated had surgery @[facility]  08-17-2023  had issues w/ PONV post op, stated as far she knows she was given medication for n/v prior to surgery  Patient verbalized understanding of instructions that were given at this phone interview. Patient denies chest pain, sob, fever, cough at the interview.

## 2023-09-23 NOTE — Plan of Care (Signed)
[facility] Tonsillectomy/Adenoidectomy, Postoperative PEDS care plan entered in error.

## 2023-09-24 ENCOUNTER — Other Ambulatory Visit: Payer: Self-pay

## 2023-09-24 ENCOUNTER — Ambulatory Visit (HOSPITAL_BASED_OUTPATIENT_CLINIC_OR_DEPARTMENT_OTHER): Payer: Commercial Managed Care - PPO | Admitting: Certified Registered Nurse Anesthetist

## 2023-09-24 ENCOUNTER — Encounter (HOSPITAL_BASED_OUTPATIENT_CLINIC_OR_DEPARTMENT_OTHER): Payer: Self-pay | Admitting: Urology

## 2023-09-24 ENCOUNTER — Encounter (HOSPITAL_BASED_OUTPATIENT_CLINIC_OR_DEPARTMENT_OTHER)
Admission: RE | Disposition: A | Discharge status: Home / Self Care | Payer: Self-pay | Source: Home / Self Care | Attending: Urology

## 2023-09-24 ENCOUNTER — Ambulatory Visit (HOSPITAL_BASED_OUTPATIENT_CLINIC_OR_DEPARTMENT_OTHER)
Admission: RE | Admit: 2023-09-24 | Discharge: 2023-09-24 | Disposition: A | Discharge status: Home / Self Care | Payer: Commercial Managed Care - PPO | Attending: Urology | Admitting: Urology

## 2023-09-24 DIAGNOSIS — Z87442 Personal history of urinary calculi: Secondary | ICD-10-CM | POA: Diagnosis not present

## 2023-09-24 DIAGNOSIS — Z87448 Personal history of other diseases of urinary system: Secondary | ICD-10-CM | POA: Insufficient documentation

## 2023-09-24 DIAGNOSIS — K449 Diaphragmatic hernia without obstruction or gangrene: Secondary | ICD-10-CM | POA: Insufficient documentation

## 2023-09-24 DIAGNOSIS — Z466 Encounter for fitting and adjustment of urinary device: Secondary | ICD-10-CM

## 2023-09-24 DIAGNOSIS — Z01818 Encounter for other preprocedural examination: Secondary | ICD-10-CM

## 2023-09-24 DIAGNOSIS — E039 Hypothyroidism, unspecified: Secondary | ICD-10-CM | POA: Insufficient documentation

## 2023-09-24 DIAGNOSIS — F419 Anxiety disorder, unspecified: Secondary | ICD-10-CM | POA: Insufficient documentation

## 2023-09-24 DIAGNOSIS — E669 Obesity, unspecified: Secondary | ICD-10-CM | POA: Insufficient documentation

## 2023-09-24 DIAGNOSIS — Z6831 Body mass index (BMI) 31.0-31.9, adult: Secondary | ICD-10-CM | POA: Diagnosis not present

## 2023-09-24 DIAGNOSIS — R112 Nausea with vomiting, unspecified: Secondary | ICD-10-CM | POA: Insufficient documentation

## 2023-09-24 DIAGNOSIS — R519 Headache, unspecified: Secondary | ICD-10-CM | POA: Insufficient documentation

## 2023-09-24 DIAGNOSIS — N200 Calculus of kidney: Secondary | ICD-10-CM

## 2023-09-24 HISTORY — DX: Unspecified eustachian tube disorder, unspecified ear: H69.90

## 2023-09-24 HISTORY — PX: CYSTOSCOPY WITH RETROGRADE PYELOGRAM, URETEROSCOPY AND STENT PLACEMENT: SHX5789

## 2023-09-24 HISTORY — DX: Calculus of ureter: N20.1

## 2023-09-24 HISTORY — DX: Polycystic ovarian syndrome: E28.2

## 2023-09-24 HISTORY — DX: Prediabetes: R73.03

## 2023-09-24 HISTORY — DX: Personal history of other diseases of the digestive system: Z87.19

## 2023-09-24 HISTORY — DX: Mixed conductive and sensorineural hearing loss, unilateral, right ear with restricted hearing on the contralateral side: H90.A31

## 2023-09-24 HISTORY — DX: Other constipation: K59.09

## 2023-09-24 HISTORY — DX: Other seasonal allergic rhinitis: J30.2

## 2023-09-24 HISTORY — DX: Insomnia, unspecified: G47.00

## 2023-09-24 HISTORY — DX: Gastro-esophageal reflux disease without esophagitis: K21.9

## 2023-09-24 SURGERY — CYSTOURETEROSCOPY, WITH RETROGRADE PYELOGRAM AND STENT INSERTION
Anesthesia: General | Site: Renal | Laterality: Left

## 2023-09-24 MED ORDER — PROPOFOL 10 MG/ML IV BOLUS
INTRAVENOUS | Status: DC | PRN
  Administered 2023-09-24: 200 mg via INTRAVENOUS

## 2023-09-24 MED ORDER — DEXAMETHASONE SODIUM PHOSPHATE 10 MG/ML IJ SOLN
INTRAMUSCULAR | Status: DC | PRN
  Administered 2023-09-24: 5 mg via INTRAVENOUS

## 2023-09-24 MED ORDER — ACETAMINOPHEN 10 MG/ML IV SOLN: 1000.0000 mg | Freq: Once | INTRAVENOUS | Status: DC | PRN

## 2023-09-24 MED ORDER — CEFAZOLIN SODIUM-DEXTROSE 2-4 GM/100ML-% IV SOLN
INTRAVENOUS | Status: AC
  Filled 2023-09-24: qty 100

## 2023-09-24 MED ORDER — FENTANYL CITRATE (PF) 100 MCG/2ML IJ SOLN
INTRAMUSCULAR | Status: AC
  Filled 2023-09-24: qty 2

## 2023-09-24 MED ORDER — MIDAZOLAM HCL 2 MG/2ML IJ SOLN
INTRAMUSCULAR | Status: AC
  Filled 2023-09-24: qty 2

## 2023-09-24 MED ORDER — LIDOCAINE HCL (PF) 2 % IJ SOLN
INTRAMUSCULAR | Status: AC
  Filled 2023-09-24: qty 5

## 2023-09-24 MED ORDER — PROPOFOL 10 MG/ML IV BOLUS
INTRAVENOUS | Status: AC
  Filled 2023-09-24: qty 20

## 2023-09-24 MED ORDER — FENTANYL CITRATE (PF) 100 MCG/2ML IJ SOLN
INTRAMUSCULAR | Status: DC | PRN
  Administered 2023-09-24: 100 ug via INTRAVENOUS

## 2023-09-24 MED ORDER — PHENYLEPHRINE 80 MCG/ML (10ML) SYRINGE FOR IV PUSH (FOR BLOOD PRESSURE SUPPORT)
PREFILLED_SYRINGE | INTRAVENOUS | Status: AC
  Filled 2023-09-24: qty 10

## 2023-09-24 MED ORDER — SCOPOLAMINE 1 MG/3DAYS TD PT72
MEDICATED_PATCH | TRANSDERMAL | Status: DC | PRN
  Administered 2023-09-24: 1 via TRANSDERMAL

## 2023-09-24 MED ORDER — CEFAZOLIN SODIUM-DEXTROSE 2-4 GM/100ML-% IV SOLN
2.0000 g | INTRAVENOUS | Status: AC
  Administered 2023-09-24: 2 g via INTRAVENOUS

## 2023-09-24 MED ORDER — PHENYLEPHRINE 80 MCG/ML (10ML) SYRINGE FOR IV PUSH (FOR BLOOD PRESSURE SUPPORT)
PREFILLED_SYRINGE | INTRAVENOUS | Status: DC | PRN
  Administered 2023-09-24: 160 ug via INTRAVENOUS
  Administered 2023-09-24 (×2): 80 ug via INTRAVENOUS

## 2023-09-24 MED ORDER — DEXAMETHASONE SODIUM PHOSPHATE 10 MG/ML IJ SOLN
INTRAMUSCULAR | Status: AC
  Filled 2023-09-24: qty 1

## 2023-09-24 MED ORDER — OXYCODONE HCL 5 MG/5ML PO SOLN: 5.0000 mg | Freq: Once | ORAL | Status: DC | PRN

## 2023-09-24 MED ORDER — LIDOCAINE 2% (20 MG/ML) 5 ML SYRINGE
INTRAMUSCULAR | Status: DC | PRN
  Administered 2023-09-24: 100 mg via INTRAVENOUS

## 2023-09-24 MED ORDER — ACETAMINOPHEN 325 MG PO TABS: 325.0000 mg | ORAL_TABLET | ORAL | Status: DC | PRN

## 2023-09-24 MED ORDER — ONDANSETRON HCL 4 MG/2ML IJ SOLN: 4.0000 mg | Freq: Once | INTRAMUSCULAR | Status: DC | PRN

## 2023-09-24 MED ORDER — FENTANYL CITRATE (PF) 100 MCG/2ML IJ SOLN: 25.0000 ug | INTRAMUSCULAR | Status: DC | PRN

## 2023-09-24 MED ORDER — SODIUM CHLORIDE 0.9 % IR SOLN
Status: DC | PRN
  Administered 2023-09-24: 3000 mL

## 2023-09-24 MED ORDER — ONDANSETRON HCL 4 MG/2ML IJ SOLN
INTRAMUSCULAR | Status: AC
  Filled 2023-09-24: qty 2

## 2023-09-24 MED ORDER — MEPERIDINE HCL 25 MG/ML IJ SOLN
6.2500 mg | INTRAMUSCULAR | Status: DC | PRN
Start: 2023-09-24 — End: 2023-09-25

## 2023-09-24 MED ORDER — SCOPOLAMINE 1 MG/3DAYS TD PT72
MEDICATED_PATCH | TRANSDERMAL | Status: AC
  Filled 2023-09-24: qty 1

## 2023-09-24 MED ORDER — OXYCODONE HCL 5 MG PO TABS: 5.0000 mg | ORAL_TABLET | Freq: Once | ORAL | Status: DC | PRN

## 2023-09-24 MED ORDER — ONDANSETRON HCL 4 MG/2ML IJ SOLN
INTRAMUSCULAR | Status: DC | PRN
  Administered 2023-09-24: 4 mg via INTRAVENOUS

## 2023-09-24 MED ORDER — MIDAZOLAM HCL 5 MG/5ML IJ SOLN
INTRAMUSCULAR | Status: DC | PRN
  Administered 2023-09-24: 2 mg via INTRAVENOUS

## 2023-09-24 MED ORDER — 0.9 % SODIUM CHLORIDE (POUR BTL) OPTIME
TOPICAL | Status: DC | PRN
Start: 2023-09-24 — End: 2023-09-24
  Administered 2023-09-24: 500 mL

## 2023-09-24 MED ORDER — IOHEXOL 300 MG/ML  SOLN
INTRAMUSCULAR | Status: DC | PRN
  Administered 2023-09-24: 10 mL via URETHRAL

## 2023-09-24 MED ORDER — LACTATED RINGERS IV SOLN: INTRAVENOUS | Status: DC

## 2023-09-24 MED ORDER — ACETAMINOPHEN 160 MG/5ML PO SOLN: 325.0000 mg | ORAL | Status: DC | PRN

## 2023-09-24 SURGICAL SUPPLY — 25 items
BAG DRAIN URO-CYSTO SKYTR STRL (DRAIN) ×1 IMPLANT
BAG DRN UROCATH (DRAIN) ×1
BASKET ZERO TIP NITINOL 2.4FR (BASKET) IMPLANT
BSKT STON RTRVL ZERO TP 2.4FR (BASKET)
CATH SET URETHRAL DILATOR (CATHETERS) IMPLANT
CATH URETL OPEN 5X70 (CATHETERS) ×1 IMPLANT
CLOTH BEACON ORANGE TIMEOUT ST (SAFETY) ×1 IMPLANT
GLOVE BIO SURGEON STRL SZ7 (GLOVE) ×1 IMPLANT
GOWN STRL REUS W/TWL LRG LVL3 (GOWN DISPOSABLE) ×1 IMPLANT
GUIDEWIRE STR DUAL SENSOR (WIRE) ×1 IMPLANT
GUIDEWIRE ZIPWRE .038 STRAIGHT (WIRE) IMPLANT
IV NS 1000ML (IV SOLUTION)
IV NS 1000ML BAXH (IV SOLUTION) ×1 IMPLANT
IV NS IRRIG 3000ML ARTHROMATIC (IV SOLUTION) ×1 IMPLANT
KIT TURNOVER CYSTO (KITS) ×1 IMPLANT
LASER FIB FLEXIVA PULSE ID 365 (Laser) IMPLANT
MANIFOLD NEPTUNE II (INSTRUMENTS) ×1 IMPLANT
NS IRRIG 500ML POUR BTL (IV SOLUTION) ×1 IMPLANT
PACK CYSTO (CUSTOM PROCEDURE TRAY) ×1 IMPLANT
SLEEVE SCD COMPRESS KNEE MED (STOCKING) ×1 IMPLANT
SYR 10ML LL (SYRINGE) ×1 IMPLANT
TRACTIP FLEXIVA PULS ID 200XHI (Laser) IMPLANT
TRACTIP FLEXIVA PULSE ID 200 (Laser)
TUBE CONNECTING 12X1/4 (SUCTIONS) ×1 IMPLANT
TUBING UROLOGY SET (TUBING) ×1 IMPLANT

## 2023-09-24 NOTE — Anesthesia Postprocedure Evaluation (Signed)
Anesthesia Post Note  Patient: Patty Ruiz  Procedure(s) Performed: CYSTOSCOPY WITH LEFT RETROGRADE PYELOGRAM, LEFT URETEROSCOPY AND LEFT STENT REMOVAL (Left: Renal)     Patient location during evaluation: PACU Anesthesia Type: General Level of consciousness: awake Pain management: pain level controlled Vital Signs Assessment: post-procedure vital signs reviewed and stable Respiratory status: spontaneous breathing Cardiovascular status: stable Postop Assessment: no apparent nausea or vomiting Anesthetic complications: no  No notable events documented.  Last Vitals:  Vitals:   09/24/23 1245 09/24/23 1300  BP: 119/80 121/87  Pulse: 76 77  Resp: 14 14  Temp:    SpO2: 92% 95%    Last Pain:  Vitals:   09/24/23 1300  TempSrc:   PainSc: 0-No pain                 Caren Macadam

## 2023-09-24 NOTE — Op Note (Signed)
Operative Note  Preoperative diagnosis:  1.  Left renal stone 2. Left ureteral injury  Postoperative diagnosis: 1.  No evidence of stone and well healed ureter without stricture  Procedure(s): 1.  Cystoscopy 2. Left diagnostic ureteroscopy 3. Left retrograde pyelogram 4. Left ureteral stent removal 5. Fluoroscopy with intraoperative interpretation  Surgeon: Jettie Pagan, MD  Assistants:  None  Anesthesia:  General  Complications:  None  EBL:  Minimal  Specimens: 1. None  Drains/Catheters: 1.  None  Intraoperative findings:   Cystoscopy demonstrated no suspicious bladder lesions. Mild edema around left ureteral orifice with stent in place. Left retrograde pyelogram with no extravasation of contrast, no filling defects, no hydronephrosis, no stricture. Diagnostic left ureteroscopy with well-healed ureter with minimal scar tissue present in the left proximal ureter however no evidence of stricture, hydronephrosis, easily passable with the semirigid ureteroscope into the kidney. Left stent removal.  Indication:  Patty Ruiz is a 52 y.o. female with history of the left renal stone that was treated in September 2024 that was unfortunately complicated by a left proximal ureteral injury with mild extravasation of contrast at that time.  She has had stent in place for approximately 6 weeks and presents today for stent removal.  Description of procedure: After informed consent was obtained from the patient, the patient was identified and taken to the operating room and placed in the supine position.  General anesthesia was administered as well as perioperative IV antibiotics.  At the beginning of the case, a time-out was performed to properly identify the patient, the surgery to be performed, and the surgical site.  Sequential compression devices were applied to the lower extremities at the beginning of the case for DVT prophylaxis.  The patient was then placed in the  dorsal lithotomy supine position, prepped and draped in sterile fashion.  Preliminary scout fluoroscopy revealed that there was a stent in expected appropriate position with curl within the renal pelvis and bladder respectively.  Introduced a 5 [place] open-ended catheter into the distal left ureter and performed left retrograde pyelogram with no evidence of filling defects, hydronephrosis, extravasation of urine.  Contrast filled the collecting system and this drained appropriately.  Next, a 0.038 Glidewire was passed into the renal pelvis and secured to the drape as a safety wire.  Cystoscope was reintroduced and her stent was removed without difficulty.  Next, a semirigid ureteroscope was passed along the entirety of the ureter with no evidence of any residual trauma and only minimal scar tissue present the left proximal ureter.  Of note, there was no stenosis or stricture in this area and the scope passed quite easily into the kidney.  Satisfied, or wire was were then removed, bladder was emptied and the case was completed.  The patient tolerated the procedure well and there was no complication. Patient was awoken from anesthesia and taken to the recovery room in stable condition. I was present and scrubbed for the entirety of the case.  Plan:  Patient will be discharged home.  Follow up with me in 1 month with renal ultrasound prior.   Matt R. Tiarna Koppen MD [facility]  Pager: [phone]

## 2023-09-24 NOTE — Anesthesia Procedure Notes (Signed)
Procedure Name: LMA Insertion Date/Time: 09/24/2023 12:11 PM  Performed by: Bishop Limbo, CRNAPre-anesthesia Checklist: Patient identified, Emergency Drugs available, Suction available and Patient being monitored Patient Re-evaluated:Patient Re-evaluated prior to induction Oxygen Delivery Method: Circle System Utilized Preoxygenation: Pre-oxygenation with 100% oxygen Induction Type: IV induction Ventilation: Mask ventilation without difficulty LMA: LMA inserted LMA Size: 4.0 Number of attempts: 1 Placement Confirmation: positive ETCO2 Tube secured with: Tape Dental Injury: Teeth and Oropharynx as per pre-operative assessment

## 2023-09-24 NOTE — H&P (Signed)
Visit Report     08/24/2023   --------------------------------------------------------------------------------   Patty Ruiz. Rockett  MRN: [id number]  DOB: [birth year], 52 year old Female  SSN: [id number]   PRIMARY CARE:  C Duane Lope, MD  PRIMARY CARE FAX:  [phone]  REFERRING:  Bartholomew Crews, NP  PROVIDER:  Jettie Pagan, M.D.  TREATING:  Bartholomew Crews, NP  LOCATION:  [facility] [phone]     --------------------------------------------------------------------------------   CC/HPI: [name] is a 52 year old female seen in follow with history of nephrolithiasis.   #1. Nephrolithiasis:  She was first diagnosed with a renal stone in 10/2015. Metabolic work-up at that time revealed a 24-hour urine demonstrating hypercalciuria and hyperoxaluria. She states she has passed several stones. He has never received surgical treatment for her renal stones.  -CT 10/2015 with 6 mm left lower pole stone.  -CT A/P 07/2020 with stable 7 mm left lower pole stone.  -CT A/P 05/2021 with stable nonobstructive 7 mm left lower pole stone.  -KUB 12/08/2021 with stable 7mm left lower pole stone  -She did have an episode of flank pain and was seen by 1 my partners in 05/2021 however this pain resolved after she was found to have a nonobstructive stone. She has had no recurrent abdominal pain or flank pain since that time. She denies dysuria. She denies fevers or chills.  -After considering options, she elects for continued surveillance.   Patient currently denies fever, chills, sweats, nausea, vomiting, abdominal or flank pain, gross hematuria or dysuria.   08/01/2023: Patty Ruiz is a 52 year old female who presents today for follow-up of nephrolithiasis. She is currently experiencing left-sided lower back pain and discomfort associated with urinary frequency, urgency and dysuria. Her urinalysis does have red blood cells. She is also complaining of a headache today. She states she has not feeling well. She  is not having any fevers, chills or seeing blood in her urine.   08/24/2023: Patty Ruiz is a 52 year old female who underwent a left-sided ureteroscopy with Dr. Cardell Peach. Patiently she suffered a ureteral tear and the stent needs to be in place for the next 6 weeks. She presents today for follow-up. She is poorly tolerating the stent and is in significant amount of pain. She has also had a lot of nausea and vomiting. She denies fevers and chills but states she is just not felt well since surgery. She has no medication left at home. She is urinating about every 1-2 hours and getting up frequently throughout the course of the night.     ALLERGIES: Cipro TABS Codeine Derivatives Shellfish    MEDICATIONS: Dicyclomine Hcl PRN  Famotidine PRN  Ibuprofen  Linzess  Meloxicam  Multivitamin  Np Thyroid 90 mg tablet  Ondansetron Hcl 4 mg tablet 1 tablet PO Q 6 H PRN  Trazodone Hcl 50 mg tablet Oral  Zepbound     GU PSH: Hysterectomy Unilat SO - 2017       PSH Notes: Appendectomy, Cervical Vertebral Fusion, Jaw Surgery Repair, Hysterectomy, Hernia Repair, Cholesteatoma Surgery, Lumbar Fusion   NON-GU PSH: Appendectomy - 2017 Hernia Repair - 2017 Neck Spine Fusion - 2017     GU PMH: Renal calculus, Left - 08/01/2023, Left, - 2023, Left, - 2022, Left, - 2022, Left, - 2021, Left, she has what appears to be a single stone in the left kidney. She has not developed any new left or right renal calculi. We discussed more aggressive therapy for her stones today and I have recommended beginning indapamide. We  discussed the mechanism of action of the medication and the need to limit her sodium intake. In addition she was given a prescription for tramadol to have on hand when she does pass the stone. Finally I will plan to see her back again in 6 months for a repeat KUB and sooner if needed., - 2017, Renal calculus, left, - 2017 Microscopic hematuria, Asymptomatic microscopic hematuria - 2017 Stress Incontinence,  Female stress incontinence - 2017      PMH Notes: LUTS: She reported having sensations of bladder spasms. She indicated that these have been present for some time. She has been treated empirically with antibiotics without improvement.  Urine cultures:  12/15 - negative (contamination)  4/16 - negative  12/16 - negative (contamination)   In 4/16 she underwent elective hysterectomy. She had a Foley catheter in and when the nurse came to remove her Foley catheter she told the patient that the balloon appeared to have been blown up in the urethra. The patient did indicate that she had some pain in that region but she said it was hard to tell where the pain was coming from exactly since she had just undergone a hysterectomy. She continued to have some pain in the area of the urethra she thought for about 2 months and this seemed to be associated with some dysuria. She continues to have some discomfort that she describes as an achiness or cramping sensation at the termination of her urination currently. She also has some occasional episodes of urgency but does not recall having this before her hysterectomy.    SUI: She has noted that sneezing, exercise and other activities that increase her intra-abdominal pressure caused a small amount of leakage of urine that is not enough that she has to wear a pad.  Cystoscopy 2/17 - no abnormality.    Microscopic hematuria: She was noted to have dipstick positive hematuria by Dr. Vickey Sages and sent for further evaluation. Her urinalysis microscopically had 3-10 rbc/hpf. A noncontrasted CT scan in 11/16 revealed a single left lower pole renal calculus measuring 6 mm with no right renal calculi or ureteral stones noted. A urine culture was performed and found to be negative.  Cystoscopy 2/17 - normal.    Calculus disease: CT scan 4/17 - She was noted to have a 6 mm stone in the lower pole of her left kidney with Hounsfield units of ~900.  Metabolic workup: Serum  studies revealed no abnormality with a normal serum creatinine and PTH. Her 24-hour urine showed hypercalciuria and hyperoxaluria with a urine volume of 2.11 L/24 hours.  Treatment: Low oxalate diet, increase fluid intake and avoidance of excessive calcium intake.     NON-GU PMH: Hypercalciuria - 2023, - 2021, Hypercalciuria, - 2017 Primary hyperoxaluria, Hyperoxaluria - 2017 Encounter for general adult medical examination without abnormal findings, Encounter for preventive health examination - 2017 Unspecified dyspareunia, Dyspareunia, female - 2017 Personal history of other endocrine, nutritional and metabolic disease, History of hypothyroidism - 2017, History of hypercholesterolemia, - 2017    FAMILY HISTORY: Hypothyroidism - Runs In Family Melanoma of eye - Runs In Family   SOCIAL HISTORY: Marital Status: Married Preferred Language: English; Ethnicity: Not Hispanic Or Latino; Race: White Current Smoking Status: Patient has never smoked.  Social Drinker.  Drinks 1 caffeinated drink per day.     Notes: Alcohol use, Married, Never a smoker, Caffeine use   REVIEW OF SYSTEMS:    GU Review Female:   Patient reports frequent urination, hard to postpone urination,  burning /pain with urination, and get up at night to urinate. Patient denies leakage of urine, stream starts and stops, trouble starting your stream, have to strain to urinate, and being pregnant.  Gastrointestinal (Upper):   Patient reports nausea. Patient denies vomiting and indigestion/ heartburn.  Gastrointestinal (Lower):   Patient denies diarrhea and constipation.  Constitutional:   Patient reports fatigue. Patient denies fever, night sweats, and weight loss.  Respiratory:   Patient denies cough and shortness of breath.  Musculoskeletal:   Patient reports back pain. Patient denies joint pain.  Neurological:   Patient denies headaches and dizziness.  Psychologic:   Patient denies depression and anxiety.   VITAL SIGNS: None    MULTI-SYSTEM PHYSICAL EXAMINATION:    Constitutional: Well-nourished. No physical deformities. Normally developed. Good grooming.  Respiratory: No labored breathing, no use of accessory muscles.   Cardiovascular: Normal temperature, normal extremity pulses, no swelling, no varicosities.  Skin: No paleness, no jaundice, no cyanosis. No lesion, no ulcer, no rash.  Neurologic / Psychiatric: Oriented to time, oriented to place, oriented to person. No depression, no anxiety, no agitation.  Gastrointestinal: No mass, no tenderness, no rigidity, non obese abdomen.     Complexity of Data:  Source Of History:  Patient  Records Review:   Previous Doctor Records, Previous Hospital Records, Previous Patient Records  Urine Test Review:   Urinalysis   08/24/23  Urinalysis  Urine Appearance Clear   Urine Color Yellow   Urine Glucose Neg mg/dL  Urine Bilirubin Neg mg/dL  Urine Ketones Neg mg/dL  Urine Specific Gravity 1.015   Urine Blood 2+ ery/uL  Urine pH 5.5   Urine Protein Neg mg/dL  Urine Urobilinogen 0.2 mg/dL  Urine Nitrites Neg   Urine Leukocyte Esterase 2+ leu/uL  Urine WBC/hpf 0 - 5/hpf   Urine RBC/hpf 0 - 2/hpf   Urine Epithelial Cells 10 - 20/hpf   Urine Bacteria Rare (0-9/hpf)   Urine Mucous Present   Urine Yeast NS (Not Seen)   Urine Trichomonas Not Present   Urine Cystals NS (Not Seen)   Urine Casts NS (Not Seen)   Urine Sperm Not Present    PROCEDURES:          Urinalysis w/Scope Dipstick Dipstick Cont'd Micro  Color: Yellow Bilirubin: Neg mg/dL WBC/hpf: 0 - 5/hpf  Appearance: Clear Ketones: Neg mg/dL RBC/hpf: 0 - 2/hpf  Specific Gravity: 1.015 Blood: 2+ ery/uL Bacteria: Rare (0-9/hpf)  pH: 5.5 Protein: Neg mg/dL Cystals: NS (Not Seen)  Glucose: Neg mg/dL Urobilinogen: 0.2 mg/dL Casts: NS (Not Seen)    Nitrites: Neg Trichomonas: Not Present    Leukocyte Esterase: 2+ leu/uL Mucous: Present      Epithelial Cells: 10 - 20/hpf      Yeast: NS (Not Seen)      Sperm:  Not Present         Ketoralac 60mg  - [id number], [id number] The area was prepped and cleaned using sterile technique. A band aid was applied. The pt tolerated well. zero wasted   Qty: 60 Adm. By: Lissa Hoard McDougald  Unit: mg Lot No [id number]  Route: IM Exp. Date   Freq: None Mfgr.:   Site: Right Buttock   ASSESSMENT:      ICD-10 Details  1 GU:   Renal calculus - N20.0 Chronic, Exacerbation  2   Other Disorder Kidney/ureter - N28.89 Acute, Threat to Bodily Function  3   Ureteral obstruction secondary to calculous - N13.2 Acute, Threat to Bodily Function   PLAN:  Medications New Meds: Ketorolac Tromethamine 10 mg tablet 1 tablet PO Q 6 H stop two weeks prior to surgery  #20  0 Refill(s)  Oxybutynin Chloride 5 mg tablet 1 tablet PO TID PRN as needed for bladder spasms related to stent  #90  1 Refill(s)  Tamsulosin Hcl 0.4 mg capsule 1 capsule PO Q HS   #30  1 Refill(s)  Ondansetron Odt 8 mg tablet,disintegrating 1 tablet PO Q 6 H   #30  3 Refill(s)  Pharmacy Name:  [facility] [phone]  Address:  [address]   [place], [place] [address]  Phone:  [phone]  Fax:  [phone]            Orders Labs CULTURE, URINE          Schedule Procedure: 08/24/2023 at [facility] - [phone] - Ketoralac 60mg  (Toradol per 15mg  (60 mg)) - [id number], [id number]          Document Letter(s):  Created for Patient: Clinical Summary         Notes:   Urine was sent for culture. I do not suspect she has a UTI at this time. I did advise medication to help with stent discomfort. She was given IM ketorolac today and a prescription for oral ketorolac was sent to her pharmacy. I will also send in Flomax to help with stent discomfort as well as oxybutynin to help with frequency of urination. A prescription for Zofran was also sent to her pharmacy today. She will keep her scheduled surgical appointment on 09/24/2023. Her stent will remain in place.         Next Appointment:      Next  Appointment: 09/24/2023 12:00 PM    Appointment Type: Surgery     Location: [facility] [phone]    Provider: Jettie Pagan, M.D.    Reason for Visit: NE/OP CYSOT, (L) RPG, (L) URS, (L) STENT EXCHANGE    Urology Preoperative H&P   Chief Complaint: Left renal stone  History of Present Illness: Patty Ruiz is a 52 y.o. female with a left renal stone s/p left ureteroscopy complicated by ureteral injury here for left retrograde pyelogram, left diagnostic ureteroscopy, possible laser lithotripsy and stent exchange versus removal.    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 08/10/2023   Chronic constipation    followed by dr Lennice Sites (GI- digestive health)   Endometriosis    Eustachian tube dysfunction    Family history of breast cancer    GERD (gastroesophageal reflux disease)    Headache    gets migraines occas. 08/10/2023   History of Bell's palsy 2012   right side---  resolved   History of duodenal ulcer    History of kidney stones    History of traumatic brain injury 1996   - CLOSED--  NO RESIDUAL OTHER THAN INSOMNIA   Hyperlipidemia    Hypothyroidism    endocrinologist--- dr Talmage Nap   Insomnia, unspecified    TRAUMATIC CLOSED HEAD INJURY RESIDUAL IN 1996   Left ureteral calculus    Mixed conductive and sensorineural hearing loss of right ear with restricted hearing of left ear    PCOS (polycystic ovarian syndrome)    PONV (postoperative nausea and vomiting)    Pre-diabetes    impaired fasting glucose/ insulin resistence/  A1c 5.7  Seasonal allergic rhinitis     Past Surgical History:  Procedure Laterality Date   ANTERIOR CERVICAL DECOMP/DISCECTOMY FUSION  2000   C4 -- C5   ANTERIOR LAT LUMBAR FUSION  09/27/2017   @ [facility]/ [place] ;  L5--S1   BILATERAL SALPINGECTOMY Bilateral 03/15/2015   Procedure: BILATERAL  SALPINGECTOMY;  Surgeon: Zelphia Cairo, MD;  Location: [facility];  Service: Gynecology;  Laterality: Bilateral;   BREAST EXCISIONAL BIOPSY Left    2008 benign;   and 2017  benign   CHOLECYSTECTOMY  [address] [place], [place]   COLONOSCOPY  08/2021   CYSTOSCOPY/URETEROSCOPY/HOLMIUM LASER/STENT PLACEMENT Left 08/17/2023   Procedure: CYSTOSCOPY/LEFT RETROGRADE PYELOGRAM/LEFT URETEROSCOPY/HOLMIUM LASER/LEFT STENT PLACEMENT;  Surgeon: Jannifer Hick, MD;  Location: [facility];  Service: Urology;  Laterality: Left;  60 MINUTES NEEDED FOR CASE   DIAGNOSTIC LAPAROSCOPY     x2   last one 2011  in [place];    for endometriosis   DX LAPAROSOCPY FOR PELVIC PAIN/   REPAIR UMBILICAL HERNIA WITH MESH  06/13/2012   ENDOSCOPIC TURBINATE REDUCTION  10/2011   and Septoplasty   EXICISION AXILLARY  MASS Left 11/15/2012   LAPAROSCOPIC APPENDECTOMY N/A 10/26/2015   Procedure: APPENDECTOMY LAPAROSCOPIC;  Surgeon: Harriette Bouillon, MD;  Location: [facility] OR;  Service: General;  Laterality: N/A;   LAPAROSCOPIC ASSISTED VAGINAL HYSTERECTOMY N/A 03/15/2015   Procedure: LAPAROSCOPIC ASSISTED VAGINAL HYSTERECTOMY;  Surgeon: Zelphia Cairo, MD;  Location: [facility] [place];  Service: Gynecology;  Laterality: N/A;   LAPAROSCOPY N/A 10/26/2015   Procedure: LAPAROSCOPY DIAGNOSTIC;  Surgeon: Harriette Bouillon, MD;  Location: [facility] OR;  Service: General;  Laterality: N/A;   LUMBAR SPINE SURGERY  09/30/2017   @ [facility];   Exploration post op day 3 ALIF,  right S1 laminectomy and placement pedical screw fixation   MANDIBLE FRACTURE SURGERY Bilateral 1998   2000-- REVISION RIGHT SIDE    TONSILLECTOMY     TYMPANOPLASTY W/ MASTOIDECTOMY Right 2007   benign right ear tumor (cholesteotoma)    Allergies:  Allergies  Allergen Reactions   Ciprofloxacin Anaphylaxis, Shortness Of Breath and Swelling   Codeine Shortness Of Breath and Swelling   Morphine And Codeine Shortness Of Breath, Swelling and  Other (See Comments)    Severe abdominal cramps, causes what appears to be pancreatitis.   Shellfish Allergy Anaphylaxis    Shrimp, lobster, crab   Latex Hives   Other     GLUTEN INTOLERENCE   Adhesive [Tape] Rash    Looks like poison oak where applied    Family History  Problem Relation Age of Onset   Cancer Father 83       melanoma - retinal   Heart disease Maternal Aunt    Heart disease Maternal Uncle    Cancer Paternal Aunt 74       breast cancer- reportedly BRCA+ but report not available at present time   Breast cancer Paternal Aunt    Cancer Paternal Grandfather 77       prostate   Heart attack Paternal Grandfather    Cancer Other        reportedly has 2nd cousin on maternal side that is also BRCA+ but no report available   Heart disease Maternal Aunt    Heart disease Maternal Aunt    Cancer Paternal Aunt    Breast cancer Paternal Aunt    Stomach cancer Neg Hx    Esophageal cancer Neg Hx     Social History:  reports  that she has never smoked. She has never used smokeless tobacco. She reports that she does not drink alcohol and does not use drugs.  ROS: A complete review of systems was performed.  All systems are negative except for pertinent findings as noted.  Physical Exam:  Vital signs in last 24 hours: Temp:  [98.1 F (36.7 C)] 98.1 F (36.7 C) (10/21 1047) Pulse Rate:  [75] 75 (10/21 1047) Resp:  [17] 17 (10/21 1047) BP: (144)/(85) 144/85 (10/21 1047) SpO2:  [98 %] 98 % (10/21 1047) Weight:  [82.1 kg] 82.1 kg (10/21 1047) Constitutional:  Alert and oriented, No acute distress Cardiovascular: Regular rate and rhythm Respiratory: Normal respiratory effort, Lungs clear bilaterally GI: Abdomen is soft, nontender, nondistended, no abdominal masses GU: No CVA tenderness Lymphatic: No lymphadenopathy Neurologic: Grossly intact, no focal deficits Psychiatric: Normal mood and affect  Laboratory Data:  No results for input(s): "WBC", "HGB", "HCT", "PLT" in  the last 72 hours.  No results for input(s): "NA", "K", "CL", "GLUCOSE", "BUN", "CALCIUM", "CREATININE" in the last 72 hours.  Invalid input(s): "CO3"   No results found for this or any previous visit (from the past 24 hour(s)). No results found for this or any previous visit (from the past 240 hour(s)).  Renal Function: No results for input(s): "CREATININE" in the last 168 hours. CrCl cannot be calculated (Patient's most recent lab result is older than the maximum 21 days allowed.).  Radiologic Imaging: No results found.  I independently reviewed the above imaging studies.  Assessment and Plan Sashenka Fakes is a 52 y.o. female with a left renal stone here for left ureteroscopy with possible laser lithotripsy, stent exchange vs removal.  -The risks, benefits and alternatives of cystoscopy with left JJ stent placement was discussed with the patient.  Risks include, but are not limited to: bleeding, urinary tract infection, ureteral injury, ureteral stricture disease, chronic pain, urinary symptoms, bladder injury, stent migration, the need for nephrostomy tube placement, MI, CVA, DVT, PE and the inherent risks with general anesthesia.  The patient voices understanding and wishes to proceed.   Matt R. Wave Calzada MD 09/24/2023, 11:06 AM  [facility] Pager: [phone]): [phone]

## 2023-09-24 NOTE — Anesthesia Preprocedure Evaluation (Signed)
Anesthesia Evaluation  Patient identified by MRN, date of birth, ID band Patient awake    Reviewed: Allergy & Precautions, NPO status , Patient's Chart, lab work & pertinent test results  History of Anesthesia Complications (+) PONV and history of anesthetic complications  Airway Mallampati: I       Dental no notable dental hx.    Pulmonary neg pulmonary ROS   Pulmonary exam normal        Cardiovascular negative cardio ROS Normal cardiovascular exam     Neuro/Psych  Headaches PSYCHIATRIC DISORDERS Anxiety        GI/Hepatic Neg liver ROS, hiatal hernia,,,  Endo/Other  Hypothyroidism    Renal/GU Renal diseaseLEFT RENAL URETEROPLEVIC JUNCTION CALCULUS  negative genitourinary   Musculoskeletal   Abdominal  (+) + obese  Peds  Hematology   Anesthesia Other Findings   Reproductive/Obstetrics                             Anesthesia Physical Anesthesia Plan  ASA: 2  Anesthesia Plan: General   Post-op Pain Management:    Induction: Intravenous  PONV Risk Score and Plan: 4 or greater and Treatment may vary due to age or medical condition, Ondansetron, Dexamethasone, Midazolam, Scopolamine patch - Pre-op, Propofol infusion and TIVA  Airway Management Planned: LMA  Additional Equipment: None  Intra-op Plan:   Post-operative Plan: Extubation in OR  Informed Consent: I have reviewed the patients History and Physical, chart, labs and discussed the procedure including the risks, benefits and alternatives for the proposed anesthesia with the patient or authorized representative who has indicated his/her understanding and acceptance.     Dental advisory given  Plan Discussed with: CRNA  Anesthesia Plan Comments:        Anesthesia Quick Evaluation

## 2023-09-24 NOTE — Discharge Instructions (Addendum)
[facility] °[phone] °Post Ureteroscopy With or Without Stent Instructions ° °Definitions: ° °Ureter: The duct that transports urine from the kidney to the bladder. °Stent:   A plastic hollow tube that is placed into the ureter, from the kidney to the                 bladder to prevent the ureter from swelling shut. ° °GENERAL INSTRUCTIONS: ° °Despite the fact that no skin incisions were used, the area around the ureter and bladder is raw and irritated. The stent is a foreign body which will further irritate the bladder wall. This irritation is manifested by increased frequency of urination, both day and night, and by an increase in the urge to urinate. In some, the urge to urinate is present almost always. Sometimes the urge is strong enough that you may not be able to stop yourself from urinating. The only real cure is to remove the stent and then give time for the bladder wall to heal which can't be done until the danger of the ureter swelling shut has passed, which varies. ° °You may see some blood in your urine while the stent is in place and a few days afterwards. Do not be alarmed, even if the urine was clear for a while. Get off your feet and drink lots of fluids until clearing occurs. If you start to pass clots or don't improve, call us. ° °DIET: °You may return to your normal diet immediately. Because of the raw surface of your bladder, alcohol, spicy foods, acid type foods and drinks with caffeine may cause irritation or frequency and should be used in moderation. To keep your urine flowing freely and to avoid constipation, drink plenty of fluids during the day ( 8-10 glasses ). °Tip: Avoid cranberry juice because it is very acidic. ° °ACTIVITY: °Your physical activity doesn't need to be restricted. However, if you are very active, you may see some blood in your urine. We suggest that you reduce your activity under these circumstances until the bleeding has stopped. ° °BOWELS: °It is  important to keep your bowels regular during the postoperative period. Straining with bowel movements can cause bleeding. A bowel movement every other day is reasonable. Use a mild laxative if needed, such as Milk of Magnesia 2-3 tablespoons, or 2 Dulcolax tablets. Call if you continue to have problems. If you have been taking narcotics for pain, before, during or after your surgery, you may be constipated. Take a laxative if necessary. ° ° °MEDICATION: °You should resume your pre-surgery medications unless told not to. In addition you will often be given an antibiotic to prevent infection. These should be taken as prescribed until the bottles are finished unless you are having an unusual reaction to one of the drugs. ° °PROBLEMS YOU SHOULD REPORT TO US: °· Fevers over 100.5 Fahrenheit. °· Heavy bleeding, or clots ( See above notes about blood in urine ). °· Inability to urinate. °· Drug reactions ( hives, rash, nausea, vomiting, diarrhea ). °· Severe burning or pain with urination that is not improving. ° °FOLLOW-UP: °You will need a follow-up appointment to monitor your progress. Call for this appointment at the number listed above. Usually the first appointment will be about three to fourteen days after your surgery. ° ° ° ° ° °Post Anesthesia Home Care Instructions ° °Activity: °Get plenty of rest for the remainder of the day. A responsible individual must stay with you for 24 hours following the procedure.  °  For the next 24 hours, DO NOT: °-Drive a car °-Operate machinery °-Drink alcoholic beverages °-Take any medication unless instructed by your physician °-Make any legal decisions or sign important papers. ° °Meals: °Start with liquid foods such as gelatin or soup. Progress to regular foods as tolerated. Avoid greasy, spicy, heavy foods. If nausea and/or vomiting occur, drink only clear liquids until the nausea and/or vomiting subsides. Call your physician if vomiting continues. ° °Special  Instructions/Symptoms: °Your throat may feel dry or sore from the anesthesia or the breathing tube placed in your throat during surgery. If this causes discomfort, gargle with warm salt water. The discomfort should disappear within 24 hours. ° °If you had a scopolamine patch placed behind your ear for the management of post- operative nausea and/or vomiting: ° °1. The medication in the patch is effective for 72 hours, after which it should be removed.  Wrap patch in a tissue and discard in the trash. Wash hands thoroughly with soap and water. °2. You may remove the patch earlier than 72 hours if you experience unpleasant side effects which may include dry mouth, dizziness or visual disturbances. °3. Avoid touching the patch. Wash your hands with soap and water after contact with the patch. °  ° °

## 2023-09-24 NOTE — Transfer of Care (Signed)
Immediate Anesthesia Transfer of Care Note  Patient: Patty Ruiz  Procedure(s) Performed: CYSTOSCOPY WITH LEFT RETROGRADE PYELOGRAM, LEFT URETEROSCOPY AND LEFT STENT REMOVAL (Left: Renal)  Patient Location: PACU  Anesthesia Type:General  Level of Consciousness: awake, alert , oriented, and patient cooperative  Airway & Oxygen Therapy: Patient Spontanous Breathing  Post-op Assessment: Report given to RN and Post -op Vital signs reviewed and stable  Post vital signs: Reviewed and stable  Last Vitals:  Vitals Value Taken Time  BP 119/95 09/24/23 1238  Temp    Pulse 77 09/24/23 1240  Resp 13 09/24/23 1241  SpO2 94 % 09/24/23 1240  Vitals shown include unfiled device data.  Last Pain:  Vitals:   09/24/23 1047  TempSrc: Oral         Complications: No notable events documented.

## 2023-09-26 ENCOUNTER — Encounter (HOSPITAL_BASED_OUTPATIENT_CLINIC_OR_DEPARTMENT_OTHER): Payer: Self-pay | Admitting: Urology

## 2023-10-19 ENCOUNTER — Encounter (INDEPENDENT_AMBULATORY_CARE_PROVIDER_SITE_OTHER): Payer: Self-pay

## 2023-10-19 ENCOUNTER — Ambulatory Visit (INDEPENDENT_AMBULATORY_CARE_PROVIDER_SITE_OTHER): Payer: Commercial Managed Care - PPO

## 2023-10-19 ENCOUNTER — Ambulatory Visit (INDEPENDENT_AMBULATORY_CARE_PROVIDER_SITE_OTHER): Payer: Commercial Managed Care - PPO | Admitting: Audiology

## 2023-10-19 VITALS — Ht 64.0 in | Wt 184.0 lb

## 2023-10-19 DIAGNOSIS — J310 Chronic rhinitis: Secondary | ICD-10-CM

## 2023-10-19 DIAGNOSIS — H903 Sensorineural hearing loss, bilateral: Secondary | ICD-10-CM

## 2023-10-19 DIAGNOSIS — J343 Hypertrophy of nasal turbinates: Secondary | ICD-10-CM

## 2023-10-19 DIAGNOSIS — H608X3 Other otitis externa, bilateral: Secondary | ICD-10-CM | POA: Diagnosis not present

## 2023-10-19 DIAGNOSIS — R0981 Nasal congestion: Secondary | ICD-10-CM

## 2023-10-19 DIAGNOSIS — H6990 Unspecified Eustachian tube disorder, unspecified ear: Secondary | ICD-10-CM

## 2023-10-19 MED ORDER — MOMETASONE FUROATE 0.1 % EX CREA: TOPICAL_CREAM | CUTANEOUS | 3 refills | Status: AC

## 2023-10-19 NOTE — Progress Notes (Signed)
  [address], [address] [place], [place] [address] [phone]  Audiological Evaluation    Name: Patty Ruiz     DOB:   01/27/1971      MRN:   [id number]                                                                                     Service Date: 10/19/2023     Accompanied by: none   Patient comes today after Dr. Suszanne Conners, ENT sent a referral for a hearing evaluation due to concerns with hearing loss.   Symptoms Yes Details  Hearing loss  [x]  Previous audiogram las year was completed at Dr. Avel Sensor and indicated bilateral high frequency hearing loss.Notices that struggles to understand when there is speech in noise.  Tinnitus  [x]  Very rarely noticed  Ear pain/ Ear infections  []    Balance problems  []    Noise exposure  []    Previous ear surgeries  [x]  Had several tubes placed. Then had a right cholesteatoma removed in 2006.  Family history  [x]  Mother with age and brother who se reports to have had noise exposure in the [employer].  Amplification  []    Other  []       Tympanometry: Right ear: Type A- Normal external ear canal volume with normal middle ear pressure and tympanic membrane compliance Left ear: Type A- Normal external ear canal volume with normal middle ear pressure and tympanic membrane compliance    Pure tone Audiometry: Right ear- Normal hearing from 250-3000Hz , then mild to moderately severe sensorineural hearing loss from 4000 Hz - 8000 Hz. Left ear-  Normal hearing from 250-3000Hz , then mild to moderate sensorineural hearing loss from 4000 Hz - 8000 Hz.  The hearing test results were completed under headphones and results are deemed to be of good reliability. Test technique:  conventional     Speech Audiometry: Right ear- Speech Reception Threshold (SRT) was obtained at 15 dBHL Left ear-Speech Reception Threshold (SRT) was obtained at 20 dBHL   Word Recognition Score Tested using NU-6 (MLV) Right ear: 92% was obtained at a presentation  level of 60 dBHL with contralateral masking which is deemed as  excellent Left ear: 92% was obtained at a presentation level of 60 dBHL with contralateral masking which is deemed as  excellent    Impression: There is not a significant difference in pure-tone thresholds between ears. There is not a significant difference in the word recognition score in between ears. No significant changes when compare dto her audiogram completed last year at Dr. Avel Sensor clinic.   Recommendations: Follow up with ENT as scheduled for today. Return for a hearing evaluation if concerns with hearing changes arise or per MD recommendation. Use hearing protection when exposed to loud/damaging sounds.  Consider a communication needs assessment, if patient so desires.   Dezi Schaner MARIE LEROUX-MARTINEZ, AUD

## 2023-10-22 DIAGNOSIS — J343 Hypertrophy of nasal turbinates: Secondary | ICD-10-CM | POA: Insufficient documentation

## 2023-10-22 DIAGNOSIS — H608X3 Other otitis externa, bilateral: Secondary | ICD-10-CM | POA: Insufficient documentation

## 2023-10-22 DIAGNOSIS — H903 Sensorineural hearing loss, bilateral: Secondary | ICD-10-CM | POA: Insufficient documentation

## 2023-10-22 DIAGNOSIS — J310 Chronic rhinitis: Secondary | ICD-10-CM | POA: Insufficient documentation

## 2023-10-22 NOTE — Progress Notes (Signed)
Patient ID: Patty Ruiz, female   DOB: 1971-02-21, 52 y.o.   MRN: [id number]  Follow-up: Right ear cholesteatoma, hearing loss, chronic nasal congestion  HPI: The patient is a 52 year old female who returns today for her follow-up evaluation.  The patient was last seen 1 year ago.  At that time, she was complaining of chronic nasal congestion and hearing loss.  The patient has a history of right ear cholesteatoma and eustachian tube dysfunction.  She underwent bilateral myringotomy and tube placement as a child.  She subsequently underwent right canal wall up tympanomastoidectomy surgery in [place] in 2007 to treat the cholesteatoma.  She also has a history of chronic nasal obstruction.  She underwent septoplasty surgery by Dr. Annalee Genta more than 6 years ago.  At her last visit 1 year ago, she was noted to have nasal mucosal congestion and bilateral inferior turbinate hypertrophy.  Her hearing test showed bilateral high-frequency sensorineural hearing loss, likely secondary to presbycusis.  No recurrent cholesteatoma was noted.  The patient returns today complaining of itchy sensation in her ears.  She denies any recent change in her hearing.  Her nose is still congested.  She has occasional facial pressure, worse on the right side.  Exam: General: Communicates without difficulty, well nourished, no acute distress. Head: Normocephalic, no evidence injury, no tenderness, facial buttresses intact without stepoff. Face/sinus: No tenderness to palpation and percussion. Facial movement is normal and symmetric. Eyes: PERRL, EOMI. No scleral icterus, conjunctivae clear. Neuro: CN II exam reveals vision grossly intact.  No nystagmus at any point of gaze. Ears: Auricles well formed without lesions.  Ear canals are intact without mass or lesion.  Eczematous changes are noted within the ear canals.  The TMs are intact without fluid. Nose: External evaluation reveals normal support and skin without  lesions.  Dorsum is intact.  Anterior rhinoscopy reveals congested mucosa over anterior aspect of inferior turbinates and intact septum.  No purulence noted. Oral:  Oral cavity and oropharynx are intact, symmetric, without erythema or edema.  Mucosa is moist without lesions. Neck: Full range of motion without pain.  There is no significant lymphadenopathy.  No masses palpable.  Thyroid bed within normal limits to palpation.  Parotid glands and submandibular glands equal bilaterally without mass.  Trachea is midline. Neuro:  CN 2-12 grossly intact.    Procedure:  Flexible Nasal Endoscopy: Description: Risks, benefits, and alternatives of flexible endoscopy were explained to the patient.  Specific mention was made of the risk of throat numbness with difficulty swallowing, possible bleeding from the nose and mouth, and pain from the procedure.  The patient gave oral consent to proceed.  The flexible scope was inserted into the right nasal cavity.  Endoscopy of the interior nasal cavity, superior, inferior, and middle meatus was performed. The sphenoid-ethmoid recess was examined. Edematous mucosa was noted.  No polyp, mass, or lesion was appreciated. Olfactory cleft was clear.  Nasopharynx was clear.  Turbinates were hypertrophied but without mass.  The procedure was repeated on the contralateral side with similar findings.  The patient tolerated the procedure well.    Her hearing test shows stable bilateral high-frequency sensorineural hearing loss.  Assessment: 1.  Chronic rhinitis with nasal mucosal congestion and bilateral inferior turbinate hypertrophy . No polyps, mass, lesion, or purulent drainage is noted today.  2.  Stable bilateral high-frequency sensorineural hearing loss.  No middle ear effusion or recurrent cholesteatoma is noted today.  3.  Bilateral chronic eczematous otitis externa. 4.  Clinical  eustachian tube dysfunction.    Plan  1. The physical exam and nasal endoscopy findings are  reviewed with the patient. The hearing test results are also reviewed.  2.  Continue Flonase nasal spray 2 sprays each nostril daily.  3.  Valsalva exercise is encouraged.  4.  Elocon cream to treat the chronic eczematous otitis externa. 5.  The patient will return for re-evaluation in 1 year, sooner if needed.

## 2023-11-05 ENCOUNTER — Encounter: Payer: Self-pay | Admitting: Audiology

## 2024-05-02 ENCOUNTER — Ambulatory Visit (INDEPENDENT_AMBULATORY_CARE_PROVIDER_SITE_OTHER): Admitting: Plastic Surgery

## 2024-05-02 ENCOUNTER — Encounter: Payer: Self-pay | Admitting: Plastic Surgery

## 2024-05-02 VITALS — BP 121/86 | HR 78 | Ht 65.0 in | Wt 172.0 lb

## 2024-05-02 DIAGNOSIS — L819 Disorder of pigmentation, unspecified: Secondary | ICD-10-CM | POA: Diagnosis not present

## 2024-05-02 NOTE — Progress Notes (Signed)
 Patient ID: Patty Ruiz, female    DOB: Apr 19, 1971, 53 y.o.   MRN: [id number]   Chief Complaint  Patient presents with   Consult         The patient is a 53 year old female here for evaluation of a changing skin lesion on her left ear.  It is located at the anterior superior aspect of the helix.  It has been getting larger which has been a concern for her.  She has a very strong family history of unusual melanoma in her father that was diagnosed from an eye exam.  She is otherwise in good health and very diligent about her skin checks and skin exams.  She her past medical history is located below but nothing that should interfere with her healing.  The lesion is flesh-colored and is about 7 mm in size.    Review of Systems  Constitutional: Negative.   HENT: Negative.    Eyes: Negative.   Respiratory: Negative.    Cardiovascular: Negative.   Gastrointestinal: Negative.   Endocrine: Negative.   Genitourinary: Negative.   Musculoskeletal: Negative.     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 08/10/2023   Chronic constipation    followed by dr Neldon [place] (GI- digestive health)   Endometriosis    Eustachian tube dysfunction    Family history of breast cancer    GERD (gastroesophageal reflux disease)    Headache    gets migraines occas. 08/10/2023   History of Bell's palsy 2012   right side---  resolved   History of duodenal ulcer    History of kidney stones    History of traumatic brain injury 1996   - CLOSED--  NO RESIDUAL OTHER THAN INSOMNIA   Hyperlipidemia    Hypothyroidism    endocrinologist--- dr Ronelle Coffee   Insomnia, unspecified    TRAUMATIC CLOSED HEAD INJURY RESIDUAL IN 1996   Left ureteral calculus    Mixed conductive and sensorineural hearing loss of right ear with restricted hearing of left ear    PCOS (polycystic ovarian  syndrome)    PONV (postoperative nausea and vomiting)    Pre-diabetes    impaired fasting glucose/ insulin resistence/  A1c 5.7   Seasonal allergic rhinitis     Past Surgical History:  Procedure Laterality Date   ANTERIOR CERVICAL DECOMP/DISCECTOMY FUSION  2000   C4 -- C5   ANTERIOR LAT LUMBAR FUSION  09/27/2017   @ [facility]/ [place] ;  L5--S1   BILATERAL SALPINGECTOMY Bilateral 03/15/2015   Procedure: BILATERAL SALPINGECTOMY;  Surgeon: Ashby Lawman, MD;  Location: [facility];  Service: Gynecology;  Laterality: Bilateral;   BREAST EXCISIONAL BIOPSY Left    2008 benign;   and 2017  benign   CHOLECYSTECTOMY  [address] [place], [place]   COLONOSCOPY  08/2021   CYSTOSCOPY WITH RETROGRADE PYELOGRAM, URETEROSCOPY AND STENT PLACEMENT Left 09/24/2023   Procedure: CYSTOSCOPY WITH LEFT RETROGRADE PYELOGRAM, LEFT URETEROSCOPY AND LEFT STENT REMOVAL;  Surgeon: Lahoma Pigg, MD;  Location: [facility];  Service: Urology;  Laterality: Left;   CYSTOSCOPY/URETEROSCOPY/HOLMIUM LASER/STENT PLACEMENT Left 08/17/2023   Procedure: CYSTOSCOPY/LEFT RETROGRADE PYELOGRAM/LEFT URETEROSCOPY/HOLMIUM LASER/LEFT STENT PLACEMENT;  Surgeon: Lahoma Pigg, MD;  Location: [facility];  Service: Urology;  Laterality: Left;  60 MINUTES NEEDED FOR CASE   DIAGNOSTIC LAPAROSCOPY  x2   last one 2011  in [place];    for endometriosis   DX LAPAROSOCPY FOR PELVIC PAIN/   REPAIR UMBILICAL HERNIA WITH MESH  06/13/2012   ENDOSCOPIC TURBINATE REDUCTION  10/2011   and Septoplasty   EXICISION AXILLARY  MASS Left 11/15/2012   LAPAROSCOPIC APPENDECTOMY N/A 10/26/2015   Procedure: APPENDECTOMY LAPAROSCOPIC;  Surgeon: Sim Dryer, MD;  Location: [facility];  Service: General;  Laterality: N/A;   LAPAROSCOPIC ASSISTED VAGINAL HYSTERECTOMY N/A 03/15/2015   Procedure: LAPAROSCOPIC ASSISTED VAGINAL HYSTERECTOMY;  Surgeon: Ashby Lawman, MD;  Location: [facility];   Service: Gynecology;  Laterality: N/A;   LAPAROSCOPY N/A 10/26/2015   Procedure: LAPAROSCOPY DIAGNOSTIC;  Surgeon: Sim Dryer, MD;  Location: [facility] OR;  Service: General;  Laterality: N/A;   LUMBAR SPINE SURGERY  09/30/2017   @ [facility];   Exploration post op day 3 ALIF,  right S1 laminectomy and placement pedical screw fixation   MANDIBLE FRACTURE SURGERY Bilateral 1998   2000-- REVISION RIGHT SIDE    TONSILLECTOMY     TYMPANOPLASTY W/ MASTOIDECTOMY Right 2007   benign right ear tumor (cholesteotoma)      Current Outpatient Medications:    cholecalciferol (VITAMIN D3) 25 MCG (1000 UNIT) tablet, Take 1,000 Units by mouth daily., Disp: , Rfl:    cyanocobalamin (VITAMIN B12) 1000 MCG tablet, Take 1,000 mcg by mouth at bedtime., Disp: , Rfl:    docusate sodium  (COLACE) 100 MG capsule, Take 1 capsule (100 mg total) by mouth daily as needed for up to 30 doses., Disp: 30 capsule, Rfl: 0   EPIPEN  2-PAK 0.3 MG/0.3ML SOAJ injection, Inject 0.3 mg into the muscle See admin instructions., Disp: , Rfl: 0   famotidine (PEPCID) 40 MG tablet, Take 40 mg by mouth at bedtime., Disp: , Rfl:    folic acid (FOLVITE) 1 MG tablet, Take 1 mg by mouth at bedtime., Disp: , Rfl:    ketorolac  (TORADOL ) 10 MG tablet, Take 10 mg by mouth every 6 (six) hours as needed for moderate pain (pain score 4-6)., Disp: , Rfl:    levothyroxine  (SYNTHROID ) 150 MCG tablet, Take 150 mcg by mouth daily before breakfast., Disp: , Rfl:    linaclotide (LINZESS) 72 MCG capsule, Take 75 mcg by mouth daily before breakfast., Disp: , Rfl:    meloxicam (MOBIC) 15 MG tablet, Take 15 mg by mouth daily., Disp: , Rfl:    mometasone  (ELOCON ) 0.1 % cream, Apply topically to affected area daily as needed for itch, Disp: 15 g, Rfl: 3   Multiple Vitamins-Minerals (MULTIVITAMIN WITH MINERALS) tablet, Take 1 tablet by mouth daily., Disp: , Rfl:    ondansetron  (ZOFRAN ) 4 MG tablet, Take 1 tablet (4 mg total) by mouth every 8 (eight) hours as needed for up  to 30 doses for nausea or vomiting., Disp: 30 tablet, Rfl: 0   phenazopyridine (PYRIDIUM) 200 MG tablet, Take 200 mg by mouth 3 (three) times daily as needed for pain., Disp: , Rfl:    SUMAtriptan (IMITREX) 50 MG tablet, Take 50 mg by mouth as needed for migraine. May repeat in 2 hours if headache persists or recurs., Disp: , Rfl:    Suvorexant  (BELSOMRA ) 5 MG TABS, Take 5 mg by mouth at bedtime., Disp: , Rfl:    tirzepatide (ZEPBOUND) 7.5 MG/0.5ML Pen, Inject 7.5 mg into the skin once a week. Monday's., Disp: , Rfl:    traZODone  (DESYREL ) 50 MG tablet, Take 50 mg by mouth at bedtime., Disp: , Rfl:  Objective:   Vitals:   05/02/24 1156  BP: 121/86  Pulse: 78  SpO2: 96%    Physical Exam Vitals reviewed.  Constitutional:      Appearance: Normal appearance.  HENT:     Head: Atraumatic.  Cardiovascular:     Rate and Rhythm: Normal rate.     Pulses: Normal pulses.  Pulmonary:     Effort: Pulmonary effort is normal.  Abdominal:     Palpations: Abdomen is soft.  Skin:    General: Skin is warm.     Capillary Refill: Capillary refill takes less than 2 seconds.  Neurological:     Mental Status: She is alert and oriented to person, place, and time.  Psychiatric:        Mood and Affect: Mood normal.        Behavior: Behavior normal.        Thought Content: Thought content normal.        Judgment: Judgment normal.     Assessment & Plan:  Changing pigmented skin lesion  Will plan to remove the left helix changing skin lesion in the office and send it to pathology.   Pictures were obtained of the patient and placed in the chart with the patient's or guardian's permission.  Lindaann Requena Philippa Vessey, DO

## 2024-05-27 ENCOUNTER — Emergency Department (HOSPITAL_BASED_OUTPATIENT_CLINIC_OR_DEPARTMENT_OTHER): Admitting: Radiology

## 2024-05-27 ENCOUNTER — Emergency Department (HOSPITAL_BASED_OUTPATIENT_CLINIC_OR_DEPARTMENT_OTHER)
Admission: EM | Admit: 2024-05-27 | Discharge: 2024-05-27 | Disposition: A | Discharge status: Home / Self Care | Attending: Emergency Medicine | Admitting: Emergency Medicine

## 2024-05-27 ENCOUNTER — Encounter (HOSPITAL_BASED_OUTPATIENT_CLINIC_OR_DEPARTMENT_OTHER): Payer: Self-pay | Admitting: Emergency Medicine

## 2024-05-27 ENCOUNTER — Emergency Department (HOSPITAL_BASED_OUTPATIENT_CLINIC_OR_DEPARTMENT_OTHER)

## 2024-05-27 ENCOUNTER — Other Ambulatory Visit: Payer: Self-pay

## 2024-05-27 DIAGNOSIS — Z91040 Latex allergy status: Secondary | ICD-10-CM | POA: Insufficient documentation

## 2024-05-27 DIAGNOSIS — Z79899 Other long term (current) drug therapy: Secondary | ICD-10-CM | POA: Diagnosis not present

## 2024-05-27 DIAGNOSIS — R1010 Upper abdominal pain, unspecified: Secondary | ICD-10-CM | POA: Insufficient documentation

## 2024-05-27 DIAGNOSIS — E860 Dehydration: Secondary | ICD-10-CM | POA: Insufficient documentation

## 2024-05-27 DIAGNOSIS — R059 Cough, unspecified: Secondary | ICD-10-CM | POA: Insufficient documentation

## 2024-05-27 DIAGNOSIS — E039 Hypothyroidism, unspecified: Secondary | ICD-10-CM | POA: Diagnosis not present

## 2024-05-27 DIAGNOSIS — R112 Nausea with vomiting, unspecified: Secondary | ICD-10-CM | POA: Diagnosis present

## 2024-05-27 DIAGNOSIS — R1013 Epigastric pain: Secondary | ICD-10-CM | POA: Diagnosis not present

## 2024-05-27 LAB — URINALYSIS, ROUTINE W REFLEX MICROSCOPIC
Bilirubin Urine: NEGATIVE
Glucose, UA: NEGATIVE mg/dL
Hgb urine dipstick: NEGATIVE
Ketones, ur: NEGATIVE mg/dL
Leukocytes,Ua: NEGATIVE
Nitrite: NEGATIVE
Protein, ur: NEGATIVE mg/dL
Specific Gravity, Urine: 1.009 (ref 1.005–1.030)
pH: 5.5 (ref 5.0–8.0)

## 2024-05-27 LAB — COMPREHENSIVE METABOLIC PANEL WITH GFR
ALT: 14 U/L (ref 0–44)
AST: 15 U/L (ref 15–41)
Albumin: 4.7 g/dL (ref 3.5–5.0)
Alkaline Phosphatase: 68 U/L (ref 38–126)
Anion gap: 13 (ref 5–15)
BUN: 14 mg/dL (ref 6–20)
CO2: 22 mmol/L (ref 22–32)
Calcium: 10.1 mg/dL (ref 8.9–10.3)
Chloride: 102 mmol/L (ref 98–111)
Creatinine, Ser: 0.91 mg/dL (ref 0.44–1.00)
GFR, Estimated: >60 mL/min (ref >60)
Glucose, Bld: 87 mg/dL (ref 70–99)
Potassium: 4.2 mmol/L (ref 3.5–5.1)
Sodium: 137 mmol/L (ref 135–145)
Total Bilirubin: 0.4 mg/dL (ref 0.0–1.2)
Total Protein: 8.2 g/dL — ABNORMAL HIGH (ref 6.5–8.1)

## 2024-05-27 LAB — CBC
HCT: 46.5 % — ABNORMAL HIGH (ref 36.0–46.0)
Hemoglobin: 15.4 g/dL — ABNORMAL HIGH (ref 12.0–15.0)
MCH: 27.7 pg (ref 26.0–34.0)
MCHC: 33.1 g/dL (ref 30.0–36.0)
MCV: 83.8 fL (ref 80.0–100.0)
Platelets: 341 10*3/uL (ref 150–400)
RBC: 5.55 MIL/uL — ABNORMAL HIGH (ref 3.87–5.11)
RDW: 12.8 % (ref 11.5–15.5)
WBC: 7 10*3/uL (ref 4.0–10.5)
nRBC: 0 % (ref 0.0–0.2)

## 2024-05-27 LAB — LIPASE, BLOOD: Lipase: 46 U/L (ref 11–51)

## 2024-05-27 MED ORDER — METOCLOPRAMIDE HCL 5 MG/ML IJ SOLN
10.0000 mg | Freq: Once | INTRAMUSCULAR | Status: AC
  Administered 2024-05-27: 10 mg via INTRAVENOUS
  Filled 2024-05-27: qty 2

## 2024-05-27 MED ORDER — METOCLOPRAMIDE HCL 10 MG PO TABS
10.0000 mg | ORAL_TABLET | Freq: Four times a day (QID) | ORAL | 0 refills | Status: AC
Start: 2024-05-27 — End: ?

## 2024-05-27 MED ORDER — IOHEXOL 300 MG/ML  SOLN
100.0000 mL | Freq: Once | INTRAMUSCULAR | Status: AC | PRN
  Administered 2024-05-27: 100 mL via INTRAVENOUS

## 2024-05-27 MED ORDER — SODIUM CHLORIDE 0.9 % IV BOLUS
1000.0000 mL | Freq: Once | INTRAVENOUS | Status: AC
Start: 2024-05-27 — End: 2024-05-27
  Administered 2024-05-27: 1000 mL via INTRAVENOUS

## 2024-05-27 NOTE — ED Triage Notes (Signed)
 Nausea x vomiting x 3 weeks' Seen by pcp and gastro Sent for dehydtarion/ fluids  Lost 9 lbs in 3 weeks

## 2024-05-27 NOTE — ED Notes (Signed)
Reviewed discharge instructions, medications, and home care with pt. Pt verbalized understanding and had no further questions. Pt exited ED without complications.

## 2024-05-27 NOTE — ED Provider Notes (Signed)
 [place] EMERGENCY DEPARTMENT AT [facility] Provider Note  CSN: [id number] Arrival date & time: 05/27/24 1433  Chief Complaint(s) Nausea  HPI Patty Ruiz is a 53 y.o. female history of IBD, presenting to the emergency department with nausea.  Patient reports 3 weeks of nausea, associated vomiting.  Also having some upper abdominal pain, foul taste in mouth.  Also reports some coughing along with this.  No diarrhea.  Reports some chronic unchanged constipation.  Also reports decreased appetite.  Reports subjective fever sensation but when checking temperature it is normal.  No hematemesis or hematochezia.  Not coughing up any phlegm.  No shortness of breath.  Saw primary doctor today, called GI, recommended that she come to the emergency department for further testing, does plan to travel soon.  Has follow-up with GI later in the next month for endoscopy.  Has taken Zepbound for long period, no recent dose increases but has not taken in the past couple weeks.   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 08/10/2023   Chronic constipation    followed by dr niki (GI- digestive health)   Endometriosis    Eustachian tube dysfunction    Family history of breast cancer    GERD (gastroesophageal reflux disease)    Headache    gets migraines occas. 08/10/2023   History of Bell's palsy 2012   right side---  resolved   History of duodenal ulcer    History of kidney stones    History of traumatic brain injury 1996   - CLOSED--  NO RESIDUAL OTHER THAN INSOMNIA   Hyperlipidemia    Hypothyroidism    endocrinologist--- dr balan   Insomnia, unspecified    TRAUMATIC CLOSED HEAD INJURY RESIDUAL IN 1996   Left ureteral calculus    Mixed conductive and sensorineural hearing loss of right ear with restricted hearing of left ear     PCOS (polycystic ovarian syndrome)    PONV (postoperative nausea and vomiting)    Pre-diabetes    impaired fasting glucose/ insulin resistence/  A1c 5.7   Seasonal allergic rhinitis    Patient Active Problem List   Diagnosis Date Noted   Changing pigmented skin lesion 05/02/2024   Sensorineural hearing loss, bilateral 10/22/2023   Chronic rhinitis 10/22/2023   Hypertrophy of nasal turbinates 10/22/2023   Chronic eczematous otitis externa of both ears 10/22/2023   Pelvic and perineal pain 10/23/2018   Hypothyroid 04/19/2017   Foraminal stenosis of lumbosacral region 04/19/2017   Insomnia 04/19/2017   Anxiety 04/19/2017   Ovarian enlargement, right 4.7 cm on CT scan 10/25/2015   Menorrhagia 03/15/2015   Family history of breast cancer 12/21/2014   Axillary mass, left 11/04/2012   Incisional hernia, umbilicus 05/16/2012   Abdominal pain, possible appendicitis 05/16/2012   Home Medication(s) Prior to Admission medications   Medication Sig Start Date End Date Taking? Authorizing Provider  metoCLOPramide  (REGLAN ) 10 MG tablet Take 1 tablet (10 mg total) by mouth every 6 (six) hours. 05/27/24  Yes Francesca Elsie CROME, MD  cholecalciferol (VITAMIN D3) 25 MCG (1000 UNIT) tablet Take 1,000 Units by mouth daily.    [provider]  cyanocobalamin (VITAMIN B12) 1000 MCG tablet Take 1,000 mcg by mouth at bedtime.    [provider]  docusate sodium  (COLACE) 100 MG capsule Take 1 capsule (100 mg  total) by mouth daily as needed for up to 30 doses. 08/17/23   Selma Donnice SAUNDERS, MD  EPIPEN  2-PAK 0.3 MG/0.3ML SOAJ injection Inject 0.3 mg into the muscle See admin instructions. 08/03/15   [provider]  famotidine (PEPCID) 40 MG tablet Take 40 mg by mouth at bedtime.    [provider]  folic acid (FOLVITE) 1 MG tablet Take 1 mg by mouth at bedtime.    [provider]  ketorolac  (TORADOL ) 10 MG tablet Take 10 mg by mouth every 6 (six) hours as needed for  moderate pain (pain score 4-6).    [provider]  levothyroxine  (SYNTHROID ) 150 MCG tablet Take 150 mcg by mouth daily before breakfast.    [provider]  linaclotide (LINZESS) 72 MCG capsule Take 75 mcg by mouth daily before breakfast.    [provider]  meloxicam (MOBIC) 15 MG tablet Take 15 mg by mouth daily.    [provider]  mometasone  (ELOCON ) 0.1 % cream Apply topically to affected area daily as needed for itch 10/19/23   Karis Clunes, MD  Multiple Vitamins-Minerals (MULTIVITAMIN WITH MINERALS) tablet Take 1 tablet by mouth daily.    [provider]  ondansetron  (ZOFRAN ) 4 MG tablet Take 1 tablet (4 mg total) by mouth every 8 (eight) hours as needed for up to 30 doses for nausea or vomiting. 08/17/23   Selma Donnice SAUNDERS, MD  phenazopyridine (PYRIDIUM) 200 MG tablet Take 200 mg by mouth 3 (three) times daily as needed for pain.    [provider]  SUMAtriptan (IMITREX) 50 MG tablet Take 50 mg by mouth as needed for migraine. May repeat in 2 hours if headache persists or recurs.    [provider]  Suvorexant  (BELSOMRA ) 5 MG TABS Take 5 mg by mouth at bedtime.    [provider]  tirzepatide (ZEPBOUND) 7.5 MG/0.5ML Pen Inject 7.5 mg into the skin once a week. Monday's.    [provider]  traZODone  (DESYREL ) 50 MG tablet Take 50 mg by mouth at bedtime.    [provider]                                                                                                                                    Past Surgical History Past Surgical History:  Procedure Laterality Date   ANTERIOR CERVICAL DECOMP/DISCECTOMY FUSION  2000   C4 -- C5   ANTERIOR LAT LUMBAR FUSION  09/27/2017   @ [facility]/ [place] ;  L5--S1   BILATERAL SALPINGECTOMY Bilateral 03/15/2015   Procedure: BILATERAL SALPINGECTOMY;  Surgeon: Truman Corona, MD;  Location: [facility];  Service: Gynecology;  Laterality: Bilateral;    BREAST EXCISIONAL BIOPSY Left    2008 benign;   and 2017  benign   CHOLECYSTECTOMY  [address] [place], [place]   COLONOSCOPY  08/2021   CYSTOSCOPY WITH RETROGRADE PYELOGRAM, URETEROSCOPY AND  STENT PLACEMENT Left 09/24/2023   Procedure: CYSTOSCOPY WITH LEFT RETROGRADE PYELOGRAM, LEFT URETEROSCOPY AND LEFT STENT REMOVAL;  Surgeon: Selma Donnice SAUNDERS, MD;  Location: [facility];  Service: Urology;  Laterality: Left;   CYSTOSCOPY/URETEROSCOPY/HOLMIUM LASER/STENT PLACEMENT Left 08/17/2023   Procedure: CYSTOSCOPY/LEFT RETROGRADE PYELOGRAM/LEFT URETEROSCOPY/HOLMIUM LASER/LEFT STENT PLACEMENT;  Surgeon: Selma Donnice SAUNDERS, MD;  Location: [facility];  Service: Urology;  Laterality: Left;  60 MINUTES NEEDED FOR CASE   DIAGNOSTIC LAPAROSCOPY     x2   last one 2011  in [place];    for endometriosis   DX LAPAROSOCPY FOR PELVIC PAIN/   REPAIR UMBILICAL HERNIA WITH MESH  06/13/2012   ENDOSCOPIC TURBINATE REDUCTION  10/2011   and Septoplasty   EXICISION AXILLARY  MASS Left 11/15/2012   LAPAROSCOPIC APPENDECTOMY N/A 10/26/2015   Procedure: APPENDECTOMY LAPAROSCOPIC;  Surgeon: Debby Shipper, MD;  Location: [facility] OR;  Service: General;  Laterality: N/A;   LAPAROSCOPIC ASSISTED VAGINAL HYSTERECTOMY N/A 03/15/2015   Procedure: LAPAROSCOPIC ASSISTED VAGINAL HYSTERECTOMY;  Surgeon: Truman Corona, MD;  Location: [facility];  Service: Gynecology;  Laterality: N/A;   LAPAROSCOPY N/A 10/26/2015   Procedure: LAPAROSCOPY DIAGNOSTIC;  Surgeon: Debby Shipper, MD;  Location: [facility] OR;  Service: General;  Laterality: N/A;   LUMBAR SPINE SURGERY  09/30/2017   @ [facility];   Exploration post op day 3 ALIF,  right S1 laminectomy and placement pedical screw fixation   MANDIBLE FRACTURE SURGERY Bilateral 1998   2000-- REVISION RIGHT SIDE    TONSILLECTOMY     TYMPANOPLASTY W/ MASTOIDECTOMY Right 2007   benign right ear tumor (cholesteotoma)   Family History Family History  Problem Relation  Age of Onset   Cancer Father 52       melanoma - retinal   Heart disease Maternal Aunt    Heart disease Maternal Uncle    Cancer Paternal Aunt 33       breast cancer- reportedly BRCA+ but report not available at present time   Breast cancer Paternal Aunt    Cancer Paternal Grandfather 65       prostate   Heart attack Paternal Grandfather    Cancer Other        reportedly has 2nd cousin on maternal side that is also BRCA+ but no report available   Heart disease Maternal Aunt    Heart disease Maternal Aunt    Cancer Paternal Aunt    Breast cancer Paternal Aunt    Stomach cancer Neg Hx    Esophageal cancer Neg Hx     Social History Social History   Tobacco Use   Smoking status: Never   Smokeless tobacco: Never  Vaping Use   Vaping status: Never Used  Substance Use Topics   Alcohol use: No   Drug use: Never   Allergies Ciprofloxacin, Codeine, Morphine and codeine, Shellfish allergy, Latex, Omeprazole, Other, and Adhesive [tape]  Review of Systems Review of Systems  All other systems reviewed and are negative.   Physical Exam Vital Signs  I have reviewed the triage vital signs BP (!) 126/90   Pulse 71   Temp 98.9 F (37.2 C)   Resp 16   LMP 02/18/2015 (Exact Date)   SpO2 96%  Physical Exam Vitals and nursing note reviewed.  Constitutional:      General: She is not in acute distress.    Appearance: She is well-developed.  HENT:     Head: Normocephalic and atraumatic.     Mouth/Throat:  Mouth: Mucous membranes are moist.   Eyes:     Pupils: Pupils are equal, round, and reactive to light.    Cardiovascular:     Rate and Rhythm: Normal rate and regular rhythm.     Heart sounds: No murmur heard. Pulmonary:     Effort: Pulmonary effort is normal. No respiratory distress.     Breath sounds: Normal breath sounds.  Abdominal:     General: Abdomen is flat.     Palpations: Abdomen is soft.     Tenderness: There is abdominal tenderness (mild epigastric).    Musculoskeletal:        General: No tenderness.     Right lower leg: No edema.     Left lower leg: No edema.   Skin:    General: Skin is warm and dry.   Neurological:     General: No focal deficit present.     Mental Status: She is alert. Mental status is at baseline.   Psychiatric:        Mood and Affect: Mood normal.        Behavior: Behavior normal.     ED Results and Treatments Labs (all labs ordered are listed, but only abnormal results are displayed) Labs Reviewed  COMPREHENSIVE METABOLIC PANEL WITH GFR - Abnormal; Notable for the following components:      Result Value   Total Protein 8.2 (*)    All other components within normal limits  CBC - Abnormal; Notable for the following components:   RBC 5.55 (*)    Hemoglobin 15.4 (*)    HCT 46.5 (*)    All other components within normal limits  LIPASE, BLOOD  URINALYSIS, ROUTINE W REFLEX MICROSCOPIC                                                                                                                          Radiology DG Chest 2 View Result Date: 05/27/2024 CLINICAL DATA:  cough EXAM: CHEST - 2 VIEW COMPARISON:  05/01/2020 FINDINGS: Lungs are clear. Heart size and mediastinal contours are within normal limits. No effusion. Thoracic dextroscoliosis. Cervical fixation hardware partially visualized. IMPRESSION: No acute cardiopulmonary disease. Electronically Signed   By: JONETTA Faes M.D.   On: 05/27/2024 17:30   CT ABDOMEN PELVIS W CONTRAST Result Date: 05/27/2024 CLINICAL DATA:  Abdominal pain. EXAM: CT ABDOMEN AND PELVIS WITH CONTRAST TECHNIQUE: Multidetector CT imaging of the abdomen and pelvis was performed using the standard protocol following bolus administration of intravenous contrast. RADIATION DOSE REDUCTION: This exam was performed according to the departmental dose-optimization program which includes automated exposure control, adjustment of the mA and/or kV according to patient size and/or use of  iterative reconstruction technique. CONTRAST:  OMNIPAQUE  IOHEXOL  300 MG/ML  SOLN COMPARISON:  CT abdomen pelvis dated 08/01/2023. FINDINGS: Lower chest: The visualized lung bases are clear. No intra-abdominal free air or free fluid. Hepatobiliary: The liver is unremarkable. There is mild dilatation, post cholecystectomy. No retained calcified stone  noted in the central CBD. Pancreas: Unremarkable. No pancreatic ductal dilatation or surrounding inflammatory changes. Spleen: Normal in size without focal abnormality. Adrenals/Urinary Tract: The adrenal glands unremarkable. There is no hydronephrosis on either side. There is symmetric enhancement and excretion of contrast by both kidneys. The visualized ureters and urinary bladder appear unremarkable. Stomach/Bowel: There is no bowel obstruction or active inflammation. Appendectomy. Vascular/Lymphatic: The abdominal aorta and IVC are unremarkable. No portal venous gas. There is no adenopathy. Reproductive: Hysterectomy.  No suspicious adnexal masses. Other: Umbilical hernia repair mesh. Musculoskeletal: Scoliosis and lower lumbar fusion. No acute osseous pathology. IMPRESSION: 1. No acute intra-abdominal or pelvic pathology. 2. No bowel obstruction. Appendectomy. Electronically Signed   By: Vanetta Chou M.D.   On: 05/27/2024 17:30    Pertinent labs & imaging results that were available during my care of the patient were reviewed by me and considered in my medical decision making (see MDM for details).  Medications Ordered in ED Medications  sodium chloride  0.9 % bolus 1,000 mL (0 mLs Intravenous Stopped 05/27/24 1902)  metoCLOPramide  (REGLAN ) injection 10 mg (10 mg Intravenous Given 05/27/24 1747)  iohexol  (OMNIPAQUE ) 300 MG/ML solution 100 mL (100 mLs Intravenous Contrast Given 05/27/24 1713)                                                                                                                                      Procedures Procedures  (including critical care time)  Medical Decision Making / ED Course   MDM:  53 year old presenting to the emergency department with nausea, vomiting.  Patient overall well-appearing, physical examination with mild upper abdominal tenderness.  Differential includes gastritis or GERD, pancreatitis, biliary colic, medication side effect from Zepbound.  Given 3 weeks of symptoms lower concern for underlying infectious process, no true fever.  No leukocytosis on labs.  Given length of symptoms, tenderness will obtain CT scan to evaluate for intra-abdominal process like abscess, perforation, volvulus, colitis, diverticulitis.  Will try Reglan , she has been taking Zofran  at home with some improvement.  Was prescribed PPI by PMD but has not started it yet.  If CT reassuring likely discharge with recommendation to take PPI alternative antiemetic.  She does have close follow-up with GI for endoscopy.  Labs do show some mild hemoconcentration but no AKI, no significant electrolyte disturbance, normal lipase, normal LFTs.  Clinical Course as of 05/27/24 1910  Tue May 27, 2024  1908 Workup is reassuring.  Patient feels that Reglan  and fluids did help her.  X-ray and CT did not show any signs of acute process.  Recommended close follow-up with her PMD and GI physician. Will discharge patient to home. All questions answered. Patient comfortable with plan of discharge. Return precautions discussed with patient and specified on the after visit summary.  [WS]    Clinical Course User Index [WS] Francesca Elsie CROME, MD     Additional history obtained: -Additional history obtained  from spouse -External records from outside source obtained and reviewed including: Chart review including previous notes, labs, imaging, consultation notes including prior notes    Lab Tests: -I ordered, reviewed, and interpreted labs.   The pertinent results include:   Labs Reviewed  COMPREHENSIVE  METABOLIC PANEL WITH GFR - Abnormal; Notable for the following components:      Result Value   Total Protein 8.2 (*)    All other components within normal limits  CBC - Abnormal; Notable for the following components:   RBC 5.55 (*)    Hemoglobin 15.4 (*)    HCT 46.5 (*)    All other components within normal limits  LIPASE, BLOOD  URINALYSIS, ROUTINE W REFLEX MICROSCOPIC    Notable for normal lipase, mild hemoconcentration   Imaging Studies ordered: I ordered imaging studies including CXR, CT abdomen On my interpretation imaging demonstrates no acute process I independently visualized and interpreted imaging. I agree with the radiologist interpretation   Medicines ordered and prescription drug management: Meds ordered this encounter  Medications   sodium chloride  0.9 % bolus 1,000 mL   metoCLOPramide  (REGLAN ) injection 10 mg   iohexol  (OMNIPAQUE ) 300 MG/ML solution 100 mL   metoCLOPramide  (REGLAN ) 10 MG tablet    Sig: Take 1 tablet (10 mg total) by mouth every 6 (six) hours.    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 08/10/2023   Chronic constipation    followed by dr niki (GI- digestive health)   Endometriosis    Eustachian tube dysfunction    Family history of breast cancer    GERD (gastroesophageal reflux disease)    Headache    gets migraines occas. 08/10/2023   History of Bell's palsy 2012   right side---  resolved   History of duodenal ulcer    History of kidney stones    History of traumatic brain injury 1996   - CLOSED--  NO RESIDUAL  OTHER THAN INSOMNIA   Hyperlipidemia    Hypothyroidism    endocrinologist--- dr tommas   Insomnia, unspecified    TRAUMATIC CLOSED HEAD INJURY RESIDUAL IN 1996   Left ureteral calculus    Mixed conductive and sensorineural hearing loss of right ear with restricted hearing of left ear    PCOS (polycystic ovarian syndrome)    PONV (postoperative nausea and vomiting)    Pre-diabetes    impaired fasting glucose/ insulin resistence/  A1c 5.7   Seasonal allergic rhinitis       Dispostion: Disposition decision including need for hospitalization was considered, and patient discharged from emergency department.    Final Clinical Impression(s) / ED Diagnoses Final diagnoses:  Nausea and vomiting, unspecified vomiting type  Dehydration     This chart was dictated using voice recognition software.  Despite best efforts to proofread,  errors can occur which can change the documentation meaning.    Francesca Elsie CROME, MD 05/27/24 1910

## 2024-05-27 NOTE — ED Notes (Signed)
 Patient transported to CT

## 2024-05-27 NOTE — Discharge Instructions (Addendum)
 We evaluated you for your nausea and vomiting, weight loss and dehydration.  Your testing did show some mild dehydration, but did not show any dangerous cause of your symptoms.  Your symptoms could be due to reflux or possibly a residual side effect of Zepbound.  I would recommend that you take the antiacid medication prescribed by your primary doctor and please follow-up closely with your gastroenterologist.  Since your symptoms did improve with Reglan , we have sent your prescription for this.  You can take this every 6 hours as needed for nausea or vomiting.  Do not mix this with Zofran .  Please be sure to stay hydrated.  If you do have any new or worsening symptoms such as severe or worsening pain, high fevers, uncontrolled vomiting, trouble keeping down fluids, bloody stools or blood in your vomit, or any other new symptoms, please return for reassessment.

## 2024-07-25 ENCOUNTER — Ambulatory Visit: Admitting: Plastic Surgery

## 2024-07-25 ENCOUNTER — Other Ambulatory Visit (HOSPITAL_COMMUNITY)
Admission: RE | Admit: 2024-07-25 | Discharge: 2024-07-25 | Disposition: A | Discharge status: Home / Self Care | Source: Ambulatory Visit | Attending: Plastic Surgery | Admitting: Plastic Surgery

## 2024-07-25 VITALS — BP 129/92 | HR 76

## 2024-07-25 DIAGNOSIS — L819 Disorder of pigmentation, unspecified: Secondary | ICD-10-CM | POA: Diagnosis present

## 2024-07-25 DIAGNOSIS — D2322 Other benign neoplasm of skin of left ear and external auricular canal: Secondary | ICD-10-CM | POA: Diagnosis not present

## 2024-07-25 NOTE — Progress Notes (Signed)
 Procedure Note  Preoperative Dx: Changing skin lesion left ear helix  Postoperative Dx: Same  Procedure: Excision of changing skin lesion left ear helix 1 cm  Anesthesia: Lidocaine  1% with 1:100,000 epinephrine   Indication for Procedure: Changing skin lesion  Description of Procedure: Risks and complications were explained to the patient.  Consent was confirmed and the patient understands the risks and benefits.  The potential complications and alternatives were explained and the patient consents.  The patient expressed understanding the option of not having the procedure and the risks of a scar.  Time out was called and all information was confirmed to be correct.    The area was prepped and drapped.  Lidocaine  1% with epinephrine  was injected in the subcutaneous area.  After waiting several minutes for the local to take affect a #15 blade was used to excise the area in an eliptical pattern.  The skin edges were reapproximated with 5-0 Monocryl.  A dressing was applied.  The patient was given instructions on how to care for the area and a follow up appointment.  Dezyre tolerated the procedure well and there were no complications. The specimen was sent to pathology.

## 2024-07-29 LAB — SURGICAL PATHOLOGY

## 2024-08-05 ENCOUNTER — Ambulatory Visit (INDEPENDENT_AMBULATORY_CARE_PROVIDER_SITE_OTHER): Admitting: Plastic Surgery

## 2024-08-05 VITALS — BP 107/71 | HR 75

## 2024-08-05 DIAGNOSIS — L819 Disorder of pigmentation, unspecified: Secondary | ICD-10-CM

## 2024-08-05 NOTE — Progress Notes (Signed)
 The patient is a 53 year old female here for evaluation and follow-up after excision of a nevus on her left ear.  The path showed nevus no concerning areas but margins positive.  The patient can certainly have a reexcision at any time if she desires.  The area is healing well.  Sutures were removed and a Steri-Strip was applied.  Keep the Steri-Strip on for a week and then she can either change it or remove it.  Be careful with sun exposure for the next 6 months.  Follow-up as needed.  Pictures were obtained of the patient and placed in the chart with the patient's or guardian's permission.

## 2024-08-29 ENCOUNTER — Ambulatory Visit (INDEPENDENT_AMBULATORY_CARE_PROVIDER_SITE_OTHER): Payer: Commercial Managed Care - PPO | Admitting: Otolaryngology

## 2024-08-29 VITALS — BP 124/87 | HR 71 | Temp 97.8°F | Ht 65.0 in | Wt 172.0 lb

## 2024-08-29 DIAGNOSIS — R0981 Nasal congestion: Secondary | ICD-10-CM

## 2024-08-29 DIAGNOSIS — H903 Sensorineural hearing loss, bilateral: Secondary | ICD-10-CM | POA: Diagnosis not present

## 2024-08-29 DIAGNOSIS — J310 Chronic rhinitis: Secondary | ICD-10-CM | POA: Diagnosis not present

## 2024-08-29 DIAGNOSIS — J343 Hypertrophy of nasal turbinates: Secondary | ICD-10-CM

## 2024-08-29 DIAGNOSIS — H6990 Unspecified Eustachian tube disorder, unspecified ear: Secondary | ICD-10-CM

## 2024-08-29 MED ORDER — FLUTICASONE PROPIONATE 50 MCG/ACT NA SUSP: 2.0000 | Freq: Every day | NASAL | 10 refills | Status: AC

## 2024-08-29 NOTE — Progress Notes (Signed)
 Patient ID: Patty Ruiz, female   DOB: [birth year], 53 y.o.   MRN: [id number]  Follow-up: Right ear cholesteatoma, hearing loss, chronic nasal congestion  HPI: The patient is a 53 year old female who returns today for her follow-up evaluation.  The patient was last seen in November 2024.  At that time, she was complaining of chronic nasal congestion and hearing loss.  The patient has a history of right ear cholesteatoma and eustachian tube dysfunction.  She underwent bilateral myringotomy and tube placement as a child.  She subsequently underwent right canal wall up tympanomastoidectomy surgery in [place]  in 2007 to treat the cholesteatoma.  She also has a history of chronic nasal obstruction.  She underwent septoplasty surgery by Dr. Mable more than 6 years ago.  At her last visit, she was noted to have nasal mucosal congestion and bilateral inferior turbinate hypertrophy.  Her hearing test showed bilateral high-frequency sensorineural hearing loss.  No recurrent cholesteatoma was noted.  The patient returns today complaining of COVID infection last month.  She has since recovered.  She denies any recent change in her hearing.  Her nose is still congested.  Currently she denies any facial pain, fever, or visual change.  Exam: General: Communicates without difficulty, well nourished, no acute distress. Head: Normocephalic, no evidence injury, no tenderness, facial buttresses intact without stepoff. Face/sinus: No tenderness to palpation and percussion. Facial movement is normal and symmetric. Eyes: PERRL, EOMI. No scleral icterus, conjunctivae clear. Neuro: CN II exam reveals vision grossly intact.  No nystagmus at any point of gaze. Ears: Auricles well formed without lesions.  Ear canals are intact without mass or lesion.  Eczematous changes are noted within the ear canals.  The TMs are intact without fluid. Nose: External evaluation reveals normal support and skin without lesions.  Dorsum  is intact.  Anterior rhinoscopy reveals congested mucosa over anterior aspect of inferior turbinates and intact septum.  No purulence noted. Oral:  Oral cavity and oropharynx are intact, symmetric, without erythema or edema.  Mucosa is moist without lesions. Neck: Full range of motion without pain.  There is no significant lymphadenopathy.  No masses palpable.  Thyroid  bed within normal limits to palpation.  Parotid glands and submandibular glands equal bilaterally without mass.  Trachea is midline. Neuro:  CN 2-12 grossly intact.  .  Assessment: 1.  Chronic rhinitis with nasal mucosal congestion and bilateral inferior turbinate hypertrophy . No polyps, mass, lesion, or purulent drainage is noted today.  2.  Subjectively stable bilateral high-frequency sensorineural hearing loss.  No middle ear effusion or recurrent cholesteatoma is noted today.  3.  Clinical eustachian tube dysfunction.    Plan  1. The physical exam findings are reviewed with the patient.  2.  Continue Flonase  nasal spray 2 sprays each nostril daily.  3.  Valsalva exercise is encouraged.  4.  The patient will return for re-evaluation in 1 year, sooner if needed.

## 2024-09-04 ENCOUNTER — Telehealth (HOSPITAL_COMMUNITY): Payer: Self-pay

## 2024-09-04 NOTE — Telephone Encounter (Signed)
 Called [facility] family medicine at [phone] and went through the telephone tree - - was instructed by telephone to call [phone] since I was calling from a physicians office about a mutual patient.   Called [phone] and spoke to a representative from [place] at [employer]. [facility] front office personnel asked to speak with a referral coordinator and was then put on hold - - referral coordinator was busy on the other telephone line with another patient.   [facility] representative stated that she can take a message and relay it to the referral coordinator. [facility] front office representative stated that this pt is not appropriate for the [facility] at [facility] and just needs general cardiology.   [facility] office representative said she will get the message to the referral coordinator.

## 2024-09-16 ENCOUNTER — Other Ambulatory Visit: Payer: Self-pay | Admitting: Endocrinology

## 2024-09-16 DIAGNOSIS — E039 Hypothyroidism, unspecified: Secondary | ICD-10-CM

## 2024-09-18 ENCOUNTER — Ambulatory Visit
Admission: RE | Admit: 2024-09-18 | Discharge: 2024-09-18 | Disposition: A | Discharge status: Home / Self Care | Source: Ambulatory Visit | Attending: Endocrinology | Admitting: Endocrinology

## 2024-09-18 DIAGNOSIS — E039 Hypothyroidism, unspecified: Secondary | ICD-10-CM

## 2024-10-27 ENCOUNTER — Ambulatory Visit: Admitting: Family Medicine

## 2024-11-20 ENCOUNTER — Other Ambulatory Visit: Payer: Self-pay | Admitting: Adult Health

## 2024-11-20 DIAGNOSIS — N83209 Unspecified ovarian cyst, unspecified side: Secondary | ICD-10-CM

## 2024-12-05 NOTE — Progress Notes (Deleted)
" °  Cardiology Office Note:  .   Date:  12/05/2024  ID:  Patty Ruiz, DOB 10/21/1971, MRN [id number] PCP: Patty Carlin Redbird, MD  [facility] Providers Cardiologist:  None   History of Present Illness: .   No chief complaint on file.   Patty Ruiz is a 54 y.o. female with below history who presents for the evaluation of coronary calcium at the request of Patty Carlin Redbird, MD.  Discussed the use of AI scribe software for clinical note transcription with the patient, who gave verbal consent to proceed.  History of Present Illness               Problem List CAC -CAC 9.6 (90th percentile) 2. HLD    ROS: All other ROS reviewed and negative. Pertinent positives noted in the HPI.     Studies Reviewed: SABRA       Physical Exam:   VS:  LMP 02/18/2015    Wt Readings from Last 3 Encounters:  08/29/24 172 lb (78 kg)  05/02/24 172 lb (78 kg)  10/19/23 184 lb (83.5 kg)    GEN: Well nourished, well developed in no acute distress NECK: No JVD; No carotid bruits CARDIAC: ***RRR, no murmurs, rubs, gallops RESPIRATORY:  Clear to auscultation without rales, wheezing or rhonchi  ABDOMEN: Soft, non-tender, non-distended EXTREMITIES:  No edema; No deformity  ASSESSMENT AND PLAN: .   Assessment and Plan                 {Are you ordering a CV Procedure (e.g. stress test, cath, DCCV, TEE, etc)?   Press F2        :[id number]}   Follow-up: No follow-ups on file.  Signed, Darryle DASEN. Barbaraann, MD, [facility]  [facility]  [address] [place], [place] [address] [phone]  6:15 PM   "

## 2024-12-09 ENCOUNTER — Ambulatory Visit: Admitting: Cardiovascular Disease

## 2024-12-09 DIAGNOSIS — E782 Mixed hyperlipidemia: Secondary | ICD-10-CM

## 2024-12-09 DIAGNOSIS — R931 Abnormal findings on diagnostic imaging of heart and coronary circulation: Secondary | ICD-10-CM

## 2024-12-12 ENCOUNTER — Ambulatory Visit (HOSPITAL_BASED_OUTPATIENT_CLINIC_OR_DEPARTMENT_OTHER): Admitting: Cardiovascular Disease

## 2024-12-15 ENCOUNTER — Encounter: Payer: Self-pay | Admitting: Family Medicine

## 2024-12-15 ENCOUNTER — Ambulatory Visit: Admitting: Family Medicine

## 2024-12-15 VITALS — BP 118/74 | HR 75 | Ht 64.0 in | Wt 169.6 lb

## 2024-12-15 DIAGNOSIS — J302 Other seasonal allergic rhinitis: Secondary | ICD-10-CM | POA: Insufficient documentation

## 2024-12-15 DIAGNOSIS — E063 Autoimmune thyroiditis: Secondary | ICD-10-CM

## 2024-12-15 DIAGNOSIS — R7303 Prediabetes: Secondary | ICD-10-CM | POA: Diagnosis not present

## 2024-12-15 DIAGNOSIS — E282 Polycystic ovarian syndrome: Secondary | ICD-10-CM

## 2024-12-15 DIAGNOSIS — T7803XA Anaphylactic reaction due to other fish, initial encounter: Secondary | ICD-10-CM | POA: Insufficient documentation

## 2024-12-15 DIAGNOSIS — Z87892 Personal history of anaphylaxis: Secondary | ICD-10-CM | POA: Insufficient documentation

## 2024-12-15 DIAGNOSIS — Z91013 Allergy to seafood: Secondary | ICD-10-CM | POA: Insufficient documentation

## 2024-12-15 DIAGNOSIS — E782 Mixed hyperlipidemia: Secondary | ICD-10-CM | POA: Diagnosis not present

## 2024-12-15 DIAGNOSIS — E663 Overweight: Secondary | ICD-10-CM | POA: Diagnosis not present

## 2024-12-15 DIAGNOSIS — Z6829 Body mass index (BMI) 29.0-29.9, adult: Secondary | ICD-10-CM

## 2024-12-15 DIAGNOSIS — M130 Polyarthritis, unspecified: Secondary | ICD-10-CM | POA: Insufficient documentation

## 2024-12-15 DIAGNOSIS — Z8639 Personal history of other endocrine, nutritional and metabolic disease: Secondary | ICD-10-CM

## 2024-12-15 DIAGNOSIS — M419 Scoliosis, unspecified: Secondary | ICD-10-CM | POA: Insufficient documentation

## 2024-12-15 DIAGNOSIS — R632 Polyphagia: Secondary | ICD-10-CM | POA: Insufficient documentation

## 2024-12-15 DIAGNOSIS — K900 Celiac disease: Secondary | ICD-10-CM

## 2024-12-15 DIAGNOSIS — F900 Attention-deficit hyperactivity disorder, predominantly inattentive type: Secondary | ICD-10-CM | POA: Diagnosis not present

## 2024-12-15 DIAGNOSIS — G43009 Migraine without aura, not intractable, without status migrainosus: Secondary | ICD-10-CM

## 2024-12-15 DIAGNOSIS — L409 Psoriasis, unspecified: Secondary | ICD-10-CM | POA: Insufficient documentation

## 2024-12-15 DIAGNOSIS — L4059 Other psoriatic arthropathy: Secondary | ICD-10-CM

## 2024-12-15 DIAGNOSIS — M722 Plantar fascial fibromatosis: Secondary | ICD-10-CM | POA: Insufficient documentation

## 2024-12-15 DIAGNOSIS — E559 Vitamin D deficiency, unspecified: Secondary | ICD-10-CM | POA: Insufficient documentation

## 2024-12-15 DIAGNOSIS — M2550 Pain in unspecified joint: Secondary | ICD-10-CM | POA: Insufficient documentation

## 2024-12-15 DIAGNOSIS — Z90710 Acquired absence of both cervix and uterus: Secondary | ICD-10-CM | POA: Insufficient documentation

## 2024-12-15 DIAGNOSIS — F909 Attention-deficit hyperactivity disorder, unspecified type: Secondary | ICD-10-CM | POA: Insufficient documentation

## 2024-12-15 DIAGNOSIS — Z87442 Personal history of urinary calculi: Secondary | ICD-10-CM | POA: Insufficient documentation

## 2024-12-15 DIAGNOSIS — M51369 Other intervertebral disc degeneration, lumbar region without mention of lumbar back pain or lower extremity pain: Secondary | ICD-10-CM | POA: Insufficient documentation

## 2024-12-15 DIAGNOSIS — K5909 Other constipation: Secondary | ICD-10-CM

## 2024-12-15 MED ORDER — LISDEXAMFETAMINE DIMESYLATE 20 MG PO CAPS: 20.0000 mg | ORAL_CAPSULE | Freq: Every morning | ORAL | 0 refills | Status: AC

## 2024-12-15 NOTE — Progress Notes (Incomplete)
 "    Patient Care Team: Patty Frieze, DO as PCP - General (Family Medicine) Patty Medford, MD as Consulting Physician (Obstetrics and Gynecology) Patty Anes, MD (Inactive) as Consulting Physician (Urology) Patty Pears, MD as Consulting Physician (Endocrinology) Patty Molly, MD as Consulting Physician (Ophthalmology) Patty Frieze, DO as Referring Physician (Family Medicine)  Weight Management:   Starting weight: 199 lbs Starting date: 12/05/2022 Today's weight: 169 Today's date: 12/15/2024 Total lbs lost to date: 30lbs Total lbs lost since last in-office visit: +1lbs Total weight loss percentage to date: -15.08% Body mass index is 29.11 kg/m.   Nutrition Plan: {EW NUTRITION HNJOD:65522}. Anti-obesity medications (including off-label): ***. Reported side effects: ***. Hunger is {EWCONTROLASSESSMENT:24261}. Cravings are {EWCONTROLASSESSMENT:24261}. Activity: {MWM EXERCISE RECS:23473}. Sleep: Number of hours slept each night: ***. Sleep {ACTION; IS/IS NOT:21021397} restful.   Diagnoses and Orders:   1. Polyarticular psoriatic arthritis (HCC)   2. Attention deficit hyperactivity disorder (ADHD), predominantly inattentive type    Meds ordered this encounter  Medications   lisdexamfetamine  (VYVANSE ) 20 MG capsule    Sig: Take 1 capsule (20 mg total) by mouth every morning.    Dispense:  30 capsule    Refill:  0   No orders of the defined types were placed in this encounter.  Assessment & Plan:   Assessment and Plan Assessment & Plan Inflammatory arthritis Significant improvement with Otezla, but severe rash led to discontinuation. Generic alternative to Humira approved despite needle phobia. - Initiate treatment with approved generic alternative to Humira. - Monitor for adverse reactions, particularly rash.  Hyperlipidemia Cholesterol levels decreased but above target. Family history of heart disease. Low cardiovascular risk score from cardiac CT. -  Continue lifestyle modifications. - Monitor cholesterol levels regularly. - Consider referral to lipidologist if further intervention needed.  Chronic constipation Improvement with Linzess, increased frequency improves bowel movements. - Continue Linzess as needed, increase frequency as tolerated.  Hypothyroidism Managed with Levoxyl . - Continue Levoxyl  as prescribed.  Attention-deficit hyperactivity disorder Inconsistent adherence to medication, reports improvement with regular use. - Encouraged regular use of ADHD medication. - Consider pill organizer to improve adherence.  Obesity Mounjaro effective in weight reduction and health improvement. Increased physical activity and endurance reported. - Continue Mounjaro as prescribed. - Encouraged continued physical activity and healthy lifestyle choices.  Migraine Managed with triptans as needed. - Continue triptans as needed.  General Health Maintenance Discussed importance of healthy lifestyle for chronic condition management. - Encouraged regular physical activity and healthy diet. - Monitor and manage chronic conditions proactively.    Ruiz Prentiss, DO, MS, FAAFP, Dipl. Patty Ruiz [facility] at [facility] [address] [place], [address] Dept: [phone] Dept Fax: [phone]  Subjective:   History of Present Illness Patty Ruiz is a 54 year old female with Ehlers-Danlos Syndrome who presents for follow-up on her condition and medication management.  Connective tissue hyperlaxity and musculoskeletal pain - Ehlers-Danlos Syndrome with significant hypermobility, especially during exercise - Experiences hyperextension and pain with activity - Hesitant to start new exercise programs due to prior injuries  Gastrointestinal symptoms - Uses Linzess a couple of days per week for gastrointestinal symptoms, though prescribed more frequently - History of severe rash with Otezla  despite initial gastrointestinal benefit - Elevated liver function tests attributed to kidney stones, limiting some medication options  Migraine headaches and allergic risk - Migraine headaches treated with triptans as needed - Carries an EpiPen  for emergencies  Metabolic and cardiovascular risk factors - High cholesterol, improved but  remains a concern due to family history of heart disease - Prior cardiac CT showed nodules - Increased sugar cravings since starting Mounjaro 7.5 mg  Recent infectious symptoms - Recent flu-like illness affecting household, now improved without fever  Attention deficit hyperactivity disorder (adhd) - ADHD with requests for medication refill - Occasionally forgets to take doses regularly  Medication management - Takes Levoxyl  in the morning - Takes famotidine, trazodone , magnesium , and B12 at night - Stopped Otezla due to severe rash - Awaiting approval for a new medication after Humira was declined  Review of Systems: Negative, with the exception of above mentioned in HPI.  History:   Reviewed by clinician on day of visit: allergies, medications, problem list, medical history, surgical history, family history, social history, and previous encounter notes.  Medications:   Show/hide medication list[1] Allergies[2]  Objective:   BP 118/74 (BP Location: Right Arm, Patient Position: Sitting, Cuff Size: Normal)   Pulse 75   Ht 5' 4 (1.626 m)   Wt 169 lb 9.6 oz (76.9 kg)   LMP 02/18/2015   SpO2 96%   BMI 29.11 kg/m  {Insert last BP/Wt (optional):23777}{See vitals history (optional):1}    Physical Exam  {Insert previous labs (optional):23779} {See past labs  Heme  Chem  Endocrine  Serology  Results Review (optional):1}  Results for orders placed or performed in visit on 07/25/24  Surgical pathology   Collection Time: 07/25/24 10:27 AM  Result Value Ref Range   SURGICAL PATHOLOGY      SURGICAL PATHOLOGY CASE:  [id number] PATIENT: Patty Ruiz Surgical Pathology Report     Clinical History: changing pigmented skin lesion (cm)     FINAL MICROSCOPIC DIAGNOSIS:  A.   SKIN, LEFT EAR HELIX, EXCISION: INTRADERMAL NEVUS. PERIPHERAL MARGIN INVOLVED.  MICROSCOPIC DESCRIPTION: There are nests of nevus cells, predominantly in the dermis, which are smaller as they are deeper.  There are nevus cells splayed between the collagen bundles of the reticular dermis and are focally present around adnexae.   There is no significant atypia and no evidence of malignancy.    GROSS DESCRIPTION:  The specimen is received in formalin and consists of a 0.7 x 0.6 x 0.4 cm papular portion of tan skin.  Resection margin is inked black, and the specimen is bisected and entirely submitted in 1 cassette.  (KL 07/25/2024)  Final Diagnosis performed by Eleanor Age, MD.   Electronically signed 07/29/2024 Technical component performed at [facility]. [facility], [address], [place], [place] [address].  Professional component performed at [facility]. [address], [place], [place] [address].  Immunohistochemistry Technical component (if applicable) was performed at [facility]. [address], [address], [place], [place] [address].   IMMUNOHISTOCHEMISTRY DISCLAIMER (if applicable): Some of these immunohistochemical stains may have been developed and the performance characteristics determine by [facility]. Some may not have been cleared or approved by the U.S. Food and Drug Administration. The FDA has determined that such clearance or approval is not necessary. This test is used for clinical purposes. It should not be regarded as investigational or for research. This laboratory is certified under the Clinical Laboratory Improvement Amendments of 1988 (CLIA-88) as qualified to perform high complexity clinical laboratory testing.  The  controls stained appropri ately.   IHC stains are performed on formalin fixed, paraffin embedded tissue using a 3,3diaminobenzidine (DAB) chromogen and Leica Bond Autostainer System. The staining intensity of the nucleus is score manually and is  reported as the percentage of tumor cell nuclei demonstrating specific nuclear staining. The specimens are fixed in 10% Neutral Formalin for at least 6 hours and up to 72hrs. These tests are validated on decalcified tissue. Results should be interpreted with caution given the possibility of false negative results on decalcified specimens. Antibody Clones are as follows ER-clone 70F, PR-clone 16, Ki67- clone MM1. Some of these immunohistochemical stains may have been developed and the performance characteristics determined by [facility].     Attestations:   {EW ATTESTATIONS:34266}  {ACUTE VISIT ATTESTATION:34267}  {EW MEDICARE SCREENING AND COUNSELING:34331}  Geni Shutter, DO, MS, FAAFP, Dipl. Patty Ruiz [facility] at [facility] [address] [place], [address] Dept: [phone] Dept Fax: [phone]     [1]  Outpatient Medications Prior to Visit  Medication Sig   cholecalciferol (VITAMIN D3) 25 MCG (1000 UNIT) tablet Take 1,000 Units by mouth daily.   cyanocobalamin (VITAMIN B12) 1000 MCG tablet Take 1,000 mcg by mouth at bedtime.   EPIPEN  2-PAK 0.3 MG/0.3ML SOAJ injection Inject 0.3 mg into the muscle See admin instructions.   famotidine (PEPCID) 40 MG tablet Take 40 mg by mouth at bedtime.   fluticasone  (FLONASE ) 50 MCG/ACT nasal spray Place 2 sprays into both nostrils daily.   folic acid (FOLVITE) 1 MG tablet Take 1 mg by mouth at bedtime.   ketorolac  (TORADOL ) 10 MG tablet Take 10 mg by mouth every 6 (six) hours as needed for moderate pain (pain score 4-6).   levothyroxine  (SYNTHROID ) 150 MCG tablet Take 150 mcg by mouth daily before breakfast.   linaclotide (LINZESS) 72 MCG capsule Take 75  mcg by mouth daily before breakfast.   meloxicam (MOBIC) 15 MG tablet Take 15 mg by mouth daily.   metoCLOPramide  (REGLAN ) 10 MG tablet Take 1 tablet (10 mg total) by mouth every 6 (six) hours.   Multiple Vitamins-Minerals (MULTIVITAMIN WITH MINERALS) tablet Take 1 tablet by mouth daily.   ondansetron  (ZOFRAN ) 4 MG tablet Take 1 tablet (4 mg total) by mouth every 8 (eight) hours as needed for up to 30 doses for nausea or vomiting.   phenazopyridine (PYRIDIUM) 200 MG tablet Take 200 mg by mouth 3 (three) times daily as needed for pain.   SUMAtriptan (IMITREX) 50 MG tablet Take 50 mg by mouth as needed for migraine. May repeat in 2 hours if headache persists or recurs.   tirzepatide (MOUNJARO) 7.5 MG/0.5ML Pen Inject 7.5 mg into the skin once a week.   traZODone  (DESYREL ) 50 MG tablet Take 50 mg by mouth at bedtime.   [DISCONTINUED] docusate sodium  (COLACE) 100 MG capsule Take 1 capsule (100 mg total) by mouth daily as needed for up to 30 doses.   [DISCONTINUED] lisdexamfetamine  (VYVANSE ) 20 MG capsule Take 20 mg by mouth every morning.   mometasone  (ELOCON ) 0.1 % cream Apply topically to affected area daily as needed for itch (Patient not taking: Reported on 12/15/2024)   Suvorexant  (BELSOMRA ) 5 MG TABS Take 5 mg by mouth at bedtime. (Patient not taking: Reported on 12/15/2024)   triamcinolone cream (KENALOG) 0.1 % Apply 1 Application topically as needed.   [DISCONTINUED] tirzepatide (ZEPBOUND) 7.5 MG/0.5ML Pen Inject 7.5 mg into the skin once a week. Monday's.   No facility-administered medications prior to visit.  [2]  Allergies Allergen Reactions   Ciprofloxacin Anaphylaxis, Shortness Of Breath and Swelling   Codeine Shortness Of Breath and Swelling   Morphine And Codeine Shortness Of Breath, Swelling and Other (See Comments)  Severe abdominal cramps, causes what appears to be pancreatitis.   Shellfish Allergy Anaphylaxis    Shrimp, lobster, crab   Spironolactone Other (See Comments)     Other Reaction(s): leg and muscle cramps   Latex Hives   Omeprazole Hives   Other     GLUTEN INTOLERENCE   Adhesive [Tape] Rash    Looks like poison oak where applied   Otezla [Apremilast] Rash   "

## 2024-12-16 ENCOUNTER — Inpatient Hospital Stay: Admission: RE | Admit: 2024-12-16 | Discharge: 2024-12-16 | Attending: Adult Health | Admitting: Adult Health

## 2024-12-16 DIAGNOSIS — N83209 Unspecified ovarian cyst, unspecified side: Secondary | ICD-10-CM

## 2024-12-17 ENCOUNTER — Institutional Professional Consult (permissible substitution) (HOSPITAL_BASED_OUTPATIENT_CLINIC_OR_DEPARTMENT_OTHER): Admitting: Internal Medicine

## 2024-12-29 ENCOUNTER — Encounter: Payer: Self-pay | Admitting: Family Medicine

## 2024-12-29 DIAGNOSIS — K5909 Other constipation: Secondary | ICD-10-CM | POA: Insufficient documentation

## 2024-12-29 DIAGNOSIS — E663 Overweight: Secondary | ICD-10-CM | POA: Insufficient documentation

## 2024-12-29 DIAGNOSIS — G43009 Migraine without aura, not intractable, without status migrainosus: Secondary | ICD-10-CM | POA: Insufficient documentation

## 2024-12-29 MED ORDER — TIRZEPATIDE 10 MG/0.5ML ~~LOC~~ SOAJ: 10.0000 mg | SUBCUTANEOUS | 1 refills | Status: AC

## 2024-12-29 NOTE — Progress Notes (Signed)
 "    Patient Care Team: Prentiss Frieze, DO as PCP - General (Family Medicine) Latisha Medford, MD as Consulting Physician (Obstetrics and Gynecology) Ceil Anes, MD (Inactive) as Consulting Physician (Urology) Tommas Pears, MD as Consulting Physician (Endocrinology) Charmayne Molly, MD as Consulting Physician (Ophthalmology) Prentiss Frieze, DO as Referring Physician (Family Medicine)  Weight Management:   Starting weight: 199 lbs Starting date: 12/05/2022 Today's weight: 169 Today's date: 12/29/2024 Total lbs lost to date: 30lbs Total lbs lost since last in-office visit: +1lbs Total weight loss percentage to date: -15.08% Body mass index is 29.11 kg/m.   Nutrition Plan: Eat protein with every meal/snack , Prioritize whole, minimally processed foods , and Mindful portions. Anti-obesity medications (including off-label): Tirzepatide , Vyvanse . Reported side effects: None. Hunger is moderately controlled. Cravings are moderately controlled. Activity: Walking a daily a week for 30-40 minutes, light weight lifting daily.  Patient is under the care of an Obesity Medicine-certified physician providing longitudinal care using a comprehensive, pillar-based obesity treatment model (nutrition therapy, physical activity counseling, behavioral modification, pharmacotherapy when indicated, and medical monitoring), consistent with standards outlined by the Obesity Medicine Association. Obesity is treated as a medical disease, not a cosmetic condition. Weight, BMI, waist circumference, blood pressure, relevant metabolic markers (as applicable), medication tolerability, and adherence will be monitored at regular follow-up intervals. Discontinuation after initial response is associated with weight regain and disease progression. Ongoing therapy is medically appropriate for chronic disease management. Medication choice is supported by evidence demonstrating improvement in weight, cardiometabolic risk,  and obesity-related complications.   Frieze Prentiss, DO, MS (Nutrition), FAAFP, Dipl. ABOM Fellow, American Academy of Family Physicians Diplomate, American Board of Obesity Medicine [facility] [address] [place], [place] [address] Dept: [phone] Fax: [phone]  Diagnoses and Orders:   1. Polyarticular psoriatic arthritis (HCC)   2. Attention deficit hyperactivity disorder (ADHD), predominantly inattentive type   3. Prediabetes   4. Mixed hyperlipidemia   5. PCOS (polycystic ovarian syndrome)   6. Celiac disease   7. Chronic constipation   8. Hypothyroidism due to Hashimoto's thyroiditis   9. History of obesity   10. Migraine without aura and without status migrainosus, not intractable   11. Overweight with body mass index (BMI) of 29 to 29.9 in adult    Meds ordered this encounter  Medications   lisdexamfetamine  (VYVANSE ) 20 MG capsule    Sig: Take 1 capsule (20 mg total) by mouth every morning.    Dispense:  30 capsule    Refill:  0   Assessment & Plan:   Assessment & Plan Inflammatory arthritis Significant improvement with Otezla, but severe rash led to discontinuation. Generic alternative to Humira approved despite needle phobia. - Initiate treatment with approved generic alternative to Humira. - Monitor for adverse reactions, particularly rash.  Hyperlipidemia Cholesterol levels decreased but above target. Family history of heart disease. Low cardiovascular risk score from cardiac CT. - Continue lifestyle modifications. - Monitor cholesterol levels regularly. - Consider referral to lipidologist if further intervention needed.  Chronic constipation Improvement with Linzess, increased frequency improves bowel movements. - Continue Linzess as needed, increase frequency as tolerated.  Hypothyroidism Managed with Levoxyl . - Continue Levoxyl  as prescribed.  Attention-deficit hyperactivity  disorder Inconsistent adherence to medication, reports improvement with regular use. - Encouraged regular use of ADHD medication. - Consider pill organizer to improve adherence.  Obesity Mounjaro  effective in weight reduction and health improvement. Increased physical activity and endurance reported. - Continue Mounjaro  as prescribed. -  Encouraged continued physical activity and healthy lifestyle choices.  Migraine Managed with triptans as needed. - Continue triptans as needed.  General Health Maintenance Discussed importance of healthy lifestyle for chronic condition management. - Encouraged regular physical activity and healthy diet. - Monitor and manage chronic conditions proactively.   Inflammatory arthritis Significant improvement with Otezla, but severe rash led to discontinuation. Generic alternative to Humira approved despite needle phobia. - Initiate treatment with approved generic alternative to Humira. - Monitor for adverse reactions, particularly rash.  Hyperlipidemia Cholesterol levels decreased but above target. Family history of heart disease. Low cardiovascular risk score from cardiac CT. - Continue lifestyle modifications. - Monitor cholesterol levels regularly. - Consider referral to lipidologist if further intervention needed.  Chronic constipation Improvement with Linzess, increased frequency improves bowel movements. - Continue Linzess as needed, increase frequency as tolerated.  Hypothyroidism Managed with Levoxyl . - Continue Levoxyl  as prescribed.  Attention-deficit hyperactivity disorder Inconsistent adherence to medication, reports improvement with regular use. - Encouraged regular use of ADHD medication. - Consider pill organizer to improve adherence.  Obesity Mounjaro  effective in weight reduction and health improvement. Increased physical activity and endurance reported. - Continue Mounjaro  as prescribed. - Encouraged continued physical  activity and healthy lifestyle choices.  Migraine Managed with triptans as needed. - Continue triptans as needed.  General Health Maintenance Discussed importance of healthy lifestyle for chronic condition management. - Encouraged regular physical activity and healthy diet. - Monitor and manage chronic conditions proactively.  Subjective:   History of Present Illness Gastrointestinal symptoms - Uses Linzess a couple of days per week for gastrointestinal symptoms, though prescribed more frequently - History of severe rash with Otezla despite initial gastrointestinal benefit  Migraine headaches and allergic risk - Migraine headaches treated with triptans as needed - Carries an EpiPen  for emergencies  Metabolic and cardiovascular risk factors - High cholesterol, improved but remains a concern due to family history of heart disease - Prior cardiac CT showed nodules - Increased sugar cravings since starting Mounjaro  7.5 mg  Recent infectious symptoms - Recent flu-like illness affecting household, now improved without fever  Attention deficit hyperactivity disorder (adhd) - ADHD with requests for medication refill - Occasionally forgets to take doses  Medication management - Takes Levoxyl  in the morning - Takes famotidine, trazodone , magnesium , and B12 at night - Stopped Otezla due to severe rash - Awaiting approval for a new medication after Humira was declined   Patty Ruiz is a 54 year old female with Ehlers-Danlos Syndrome who presents for follow-up on her condition and medication management.  Connective tissue hyperlaxity and musculoskeletal pain - Ehlers-Danlos Syndrome with significant hypermobility, especially during exercise - Experiences hyperextension and pain with activity - Hesitant to start new exercise programs due to prior injuries  Gastrointestinal symptoms - Uses Linzess a couple of days per week for gastrointestinal symptoms, though  prescribed more frequently - History of severe rash with Otezla despite initial gastrointestinal benefit - Elevated liver function tests attributed to kidney stones, limiting some medication options  Migraine headaches and allergic risk - Migraine headaches treated with triptans as needed - Carries an EpiPen  for emergencies  Metabolic and cardiovascular risk factors - High cholesterol, improved but remains a concern due to family history of heart disease - Prior cardiac CT showed nodules - Increased sugar cravings since starting Mounjaro  7.5 mg  Recent infectious symptoms - Recent flu-like illness affecting household, now improved without fever  Attention deficit hyperactivity disorder (adhd) - ADHD with requests for medication refill -  Occasionally forgets to take doses regularly  Medication management - Takes Levoxyl  in the morning - Takes famotidine, trazodone , magnesium , and B12 at night - Stopped Otezla due to severe rash - Awaiting approval for a new medication after Humira was declined  Review of Systems: Negative, with the exception of above mentioned in HPI.  History:   Reviewed by clinician on day of visit: allergies, medications, problem list, medical history, surgical history, family history, social history, and previous encounter notes.  Medications:   Show/hide medication list[1] Allergies[2]  Objective:   BP 118/74 (BP Location: Right Arm, Patient Position: Sitting, Cuff Size: Normal)   Pulse 75   Ht 5' 4 (1.626 m)   Wt 169 lb 9.6 oz (76.9 kg)   LMP 02/18/2015   SpO2 96%   BMI 29.11 kg/m   Physical Exam Constitutional:      General: She is not in acute distress.    Appearance: She is well-developed.  HENT:     Head: Normocephalic and atraumatic.  Eyes:     Conjunctiva/sclera: Conjunctivae normal.  Cardiovascular:     Rate and Rhythm: Normal rate and regular rhythm.     Heart sounds: Normal heart sounds.  Pulmonary:     Effort: Pulmonary  effort is normal.     Breath sounds: Normal breath sounds.  Neurological:     General: No focal deficit present.     Mental Status: She is alert.  Psychiatric:        Behavior: Behavior normal.    Attestations:   Patient is well-known to me from previous care setting and is establishing care in this system with me as PCP. Available records reviewed. Chart updated today with reconciliation of problem list, medications, allergies, and relevant history. Preventive care and chronic disease status reviewed.  Outside labs reviewed and will be abstracted. Portions of historical chart may remain incomplete; will update on an ongoing basis as clinically indicated.  Reviewed by clinician on day of visit: allergies, medications, problem list, medical history, surgical history, family history, social history, and previous encounter notes. Discussed the use of AI scribe software for clinical note transcription with the patient, who gave verbal consent to proceed. As the patient's primary care physician, board-certified in Family Medicine and Obesity Medicine, I am providing ongoing, comprehensive obesity care based on the pillars of obesity medicine, including nutrition therapy, physical activity, behavioral modification, and pharmacologic treatment.  I personally spent a total of 45 minutes in the care of the patient today including preparing to see the patient, performing a medically appropriate exam/evaluation, counseling and educating, placing orders, and documenting clinical information in the EHR.     [1]  Outpatient Medications Prior to Visit  Medication Sig   cholecalciferol (VITAMIN D3) 25 MCG (1000 UNIT) tablet Take 1,000 Units by mouth daily.   cyanocobalamin (VITAMIN B12) 1000 MCG tablet Take 1,000 mcg by mouth at bedtime.   EPIPEN  2-PAK 0.3 MG/0.3ML SOAJ injection Inject 0.3 mg into the muscle See admin instructions.   famotidine (PEPCID) 40 MG tablet Take 40 mg by mouth at bedtime.    fluticasone  (FLONASE ) 50 MCG/ACT nasal spray Place 2 sprays into both nostrils daily.   folic acid (FOLVITE) 1 MG tablet Take 1 mg by mouth at bedtime.   ketorolac  (TORADOL ) 10 MG tablet Take 10 mg by mouth every 6 (six) hours as needed for moderate pain (pain score 4-6).   levothyroxine  (SYNTHROID ) 150 MCG tablet Take 150 mcg by mouth daily before breakfast.   linaclotide (  LINZESS) 72 MCG capsule Take 75 mcg by mouth daily before breakfast.   meloxicam (MOBIC) 15 MG tablet Take 15 mg by mouth daily.   metoCLOPramide  (REGLAN ) 10 MG tablet Take 1 tablet (10 mg total) by mouth every 6 (six) hours.   Multiple Vitamins-Minerals (MULTIVITAMIN WITH MINERALS) tablet Take 1 tablet by mouth daily.   ondansetron  (ZOFRAN ) 4 MG tablet Take 1 tablet (4 mg total) by mouth every 8 (eight) hours as needed for up to 30 doses for nausea or vomiting.   phenazopyridine (PYRIDIUM) 200 MG tablet Take 200 mg by mouth 3 (three) times daily as needed for pain.   SUMAtriptan (IMITREX) 50 MG tablet Take 50 mg by mouth as needed for migraine. May repeat in 2 hours if headache persists or recurs.   tirzepatide  (MOUNJARO ) 7.5 MG/0.5ML Pen Inject 7.5 mg into the skin once a week.   traZODone  (DESYREL ) 50 MG tablet Take 50 mg by mouth at bedtime.   [DISCONTINUED] docusate sodium  (COLACE) 100 MG capsule Take 1 capsule (100 mg total) by mouth daily as needed for up to 30 doses.   [DISCONTINUED] lisdexamfetamine  (VYVANSE ) 20 MG capsule Take 20 mg by mouth every morning.   mometasone  (ELOCON ) 0.1 % cream Apply topically to affected area daily as needed for itch (Patient not taking: Reported on 12/15/2024)   Suvorexant  (BELSOMRA ) 5 MG TABS Take 5 mg by mouth at bedtime. (Patient not taking: Reported on 12/15/2024)   triamcinolone cream (KENALOG) 0.1 % Apply 1 Application topically as needed.   [DISCONTINUED] tirzepatide  (ZEPBOUND ) 7.5 MG/0.5ML Pen Inject 7.5 mg into the skin once a week. Monday's.   No facility-administered  medications prior to visit.  [2]  Allergies Allergen Reactions   Ciprofloxacin Anaphylaxis, Shortness Of Breath and Swelling   Codeine Shortness Of Breath and Swelling   Morphine And Codeine Shortness Of Breath, Swelling and Other (See Comments)    Severe abdominal cramps, causes what appears to be pancreatitis.   Shellfish Allergy Anaphylaxis    Shrimp, lobster, crab   Spironolactone Other (See Comments)    Other Reaction(s): leg and muscle cramps   Latex Hives   Omeprazole Hives   Other     GLUTEN INTOLERENCE   Adhesive [Tape] Rash    Looks like poison oak where applied   Otezla [Apremilast] Rash   "

## 2025-02-12 ENCOUNTER — Ambulatory Visit: Admitting: Family Medicine
